# Patient Record
Sex: Female | Born: 1970
Health system: Southern US, Community
[De-identification: ages and names within clinical notes are randomized; demographics above are authoritative.]

## PROBLEM LIST (undated history)

## (undated) DIAGNOSIS — K219 Gastro-esophageal reflux disease without esophagitis: Secondary | ICD-10-CM

## (undated) DIAGNOSIS — C801 Malignant (primary) neoplasm, unspecified: Secondary | ICD-10-CM

## (undated) DIAGNOSIS — D649 Anemia, unspecified: Secondary | ICD-10-CM

## (undated) DIAGNOSIS — IMO0001 Reserved for inherently not codable concepts without codable children: Secondary | ICD-10-CM

## (undated) DIAGNOSIS — Z98891 History of uterine scar from previous surgery: Secondary | ICD-10-CM

## (undated) DIAGNOSIS — R112 Nausea with vomiting, unspecified: Secondary | ICD-10-CM

## (undated) DIAGNOSIS — Z8719 Personal history of other diseases of the digestive system: Secondary | ICD-10-CM

## (undated) DIAGNOSIS — N63 Unspecified lump in unspecified breast: Secondary | ICD-10-CM

## (undated) DIAGNOSIS — K279 Peptic ulcer, site unspecified, unspecified as acute or chronic, without hemorrhage or perforation: Secondary | ICD-10-CM

## (undated) DIAGNOSIS — F419 Anxiety disorder, unspecified: Secondary | ICD-10-CM

## (undated) DIAGNOSIS — T7840XA Allergy, unspecified, initial encounter: Secondary | ICD-10-CM

## (undated) DIAGNOSIS — Z9889 Other specified postprocedural states: Secondary | ICD-10-CM

## (undated) HISTORY — DX: Peptic ulcer, site unspecified, unspecified as acute or chronic, without hemorrhage or perforation: K27.9

## (undated) HISTORY — PX: UPPER GASTROINTESTINAL ENDOSCOPY: SHX188

## (undated) HISTORY — DX: Allergy, unspecified, initial encounter: T78.40XA

## (undated) HISTORY — DX: Reserved for inherently not codable concepts without codable children: IMO0001

## (undated) HISTORY — DX: Unspecified lump in unspecified breast: N63.0

## (undated) HISTORY — PX: BREAST BIOPSY: SHX20

## (undated) HISTORY — DX: History of uterine scar from previous surgery: Z98.891

## (undated) HISTORY — DX: Gastro-esophageal reflux disease without esophagitis: K21.9

## (undated) HISTORY — PX: EYE SURGERY: SHX253

## (undated) HISTORY — DX: Anemia, unspecified: D64.9

## (undated) HISTORY — DX: Anxiety disorder, unspecified: F41.9

---

## 1999-07-19 ENCOUNTER — Other Ambulatory Visit: Admission: RE | Admit: 1999-07-19 | Discharge: 1999-07-19 | Payer: Self-pay | Admitting: Obstetrics & Gynecology

## 2001-02-18 ENCOUNTER — Other Ambulatory Visit: Admission: RE | Admit: 2001-02-18 | Discharge: 2001-02-18 | Payer: Self-pay | Admitting: Obstetrics & Gynecology

## 2002-06-27 ENCOUNTER — Inpatient Hospital Stay (HOSPITAL_COMMUNITY): Admission: AD | Admit: 2002-06-27 | Discharge: 2002-06-29 | Payer: Self-pay | Admitting: Obstetrics & Gynecology

## 2003-08-16 ENCOUNTER — Other Ambulatory Visit: Admission: RE | Admit: 2003-08-16 | Discharge: 2003-08-16 | Payer: Self-pay | Admitting: Obstetrics and Gynecology

## 2004-10-20 ENCOUNTER — Other Ambulatory Visit: Admission: RE | Admit: 2004-10-20 | Discharge: 2004-10-20 | Payer: Self-pay | Admitting: Obstetrics and Gynecology

## 2005-07-05 ENCOUNTER — Ambulatory Visit (HOSPITAL_COMMUNITY): Admission: RE | Admit: 2005-07-05 | Discharge: 2005-07-05 | Payer: Self-pay | Admitting: Family Medicine

## 2006-09-30 ENCOUNTER — Other Ambulatory Visit: Admission: RE | Admit: 2006-09-30 | Discharge: 2006-09-30 | Payer: Self-pay | Admitting: Obstetrics & Gynecology

## 2007-06-12 ENCOUNTER — Encounter: Admission: RE | Admit: 2007-06-12 | Discharge: 2007-06-12 | Payer: Self-pay | Admitting: Obstetrics and Gynecology

## 2007-06-20 ENCOUNTER — Encounter: Admission: RE | Admit: 2007-06-20 | Discharge: 2007-06-20 | Payer: Self-pay | Admitting: Obstetrics and Gynecology

## 2007-11-25 ENCOUNTER — Other Ambulatory Visit: Admission: RE | Admit: 2007-11-25 | Discharge: 2007-11-25 | Payer: Self-pay | Admitting: Obstetrics and Gynecology

## 2007-12-08 ENCOUNTER — Encounter: Admission: RE | Admit: 2007-12-08 | Discharge: 2007-12-08 | Payer: Self-pay | Admitting: Obstetrics and Gynecology

## 2008-06-30 ENCOUNTER — Encounter: Admission: RE | Admit: 2008-06-30 | Discharge: 2008-06-30 | Payer: Self-pay | Admitting: Obstetrics and Gynecology

## 2008-12-13 ENCOUNTER — Other Ambulatory Visit: Admission: RE | Admit: 2008-12-13 | Discharge: 2008-12-13 | Payer: Self-pay | Admitting: Obstetrics and Gynecology

## 2009-10-18 ENCOUNTER — Encounter: Admission: RE | Admit: 2009-10-18 | Discharge: 2009-10-18 | Payer: Self-pay | Admitting: Obstetrics and Gynecology

## 2010-11-01 ENCOUNTER — Ambulatory Visit: Admit: 2010-11-01 | Payer: Self-pay | Admitting: Internal Medicine

## 2010-11-01 ENCOUNTER — Ambulatory Visit (HOSPITAL_COMMUNITY)
Admission: RE | Admit: 2010-11-01 | Discharge: 2010-11-01 | Payer: Self-pay | Source: Home / Self Care | Attending: Internal Medicine | Admitting: Internal Medicine

## 2010-11-12 ENCOUNTER — Encounter: Payer: Self-pay | Admitting: Obstetrics & Gynecology

## 2010-11-13 NOTE — Op Note (Signed)
NAMECAIAH, MCGLATHERY            ACCOUNT NO.:  [id number]  MEDICAL RECORD NO.:  [id number]          PATIENT TYPE:  AMB  LOCATION:  DAY                           FACILITY:  [facility]  PHYSICIAN:  Lionel December, M.D.    DATE OF BIRTH:  [birth year]  DATE OF PROCEDURE:  11/01/2010 DATE OF DISCHARGE:  11/01/2010                              OPERATIVE REPORT   PROCEDURE:  Esophagogastroduodenoscopy.  INDICATIONS:  Samantha Wang is 40 year old Caucasian female who was diagnosed with gastric ulcer about 18 months ago and has been on various PPIs.  Biopsies were negative for inflammatory bowel disease or neoplasm.  She has been treated for H. pylori infection, although studies have been negative and her gastrin level has been normal.  She does not take any NSAIDs.  She also had EUS which did not show any infiltrating process.  She is presently asymptomatic, but on a Dexilant and ferrous sulfate.  She is undergoing EGD to hopefully to document that ulcer has healed completely.  Procedures were reviewed with the patient.  Informed consent was obtained.  MEDS FOR CONSCIOUS SEDATION:  Cetacaine spray for pharyngeal topical anesthesia, Demerol 50 mg IV, Versed 12 mg IV.  FINDINGS:  Procedure performed in endoscopy suite.  The patient's vital signs and O2 sat were monitored during the procedure and remained stable.  The patient was placed in left lateral recumbent position and Pentax videoscope was passed through oropharynx without any difficulty into esophagus.  Esophagus.  Mucosa of the esophagus normal.  GE junction was located at 35 cm from the incisors serrated otherwise unremarkable.  Hiatus was at 37 cm.  Stomach.  It was empty and distended very well by insufflation.  Folds of proximal stomach are normal.  Examination of mucosa at body and antrum was normal.  Antrum, there was a pseudo-pylorus with some scarring, however, ulcer was beyond this towards 11 o'clock close to the pylorus,  surrounded by erythematous mucosa.  This ulcer was about 8 x 10 mm.  It is clean base.  Pyloric channel was patent.  Angularis, fundus, and cardia were examined by retroflexion of scope and were normal.  On the way out biopsy was taken from the mucosa surrounding this ulcer and ulcer margin.  Duodenum.  Bulbar mucosa was normal.  Scope was passed to the second part of duodenal mucosa and folds were normal.  Endoscope was withdrawn. The patient tolerated the procedure well.  FINAL DIAGNOSES: 1. A 2-cm size sliding hiatal hernia. 2. Prepyloric gastric ulcer about 8 x 10 mm and small scars in the antrum. 3. Biopsy taken from ulcer margin and surrounding mucosa for routine     histology.  RECOMMENDATIONS:  She will continue antireflux measures and Dexilant as before.  I will be contacting the patient with results of biopsy and further recommendations.     Lionel December, M.D.     NR/MEDQ  D:  11/01/2010  T:  11/02/2010  Job:  [id number]  cc:   Donna Bernard, M.D. Fax: [phone]  Electronically Signed by Lionel December M.D. on 11/13/2010 12:17:55 PM

## 2011-03-09 NOTE — Discharge Summary (Signed)
NAMESEMIKA, ZOU                      ACCOUNT NO.:  [id number]   MEDICAL RECORD NO.:  [id number]                   PATIENT TYPE:  INP   LOCATION:  9128                                 FACILITY:  [facility]   PHYSICIAN:  Randye Lobo, M.D.                DATE OF BIRTH:  09-14-1971   DATE OF ADMISSION:  06/27/2002  DATE OF DISCHARGE:  06/29/2002                                 DISCHARGE SUMMARY   FINAL DIAGNOSES:  1. Intrauterine pregnancy at term.  2. Spnotaneous rupture of membranes.  3. Desires repeat cesarean section.   PROCEDURE:  Low transverse cesarean section.   SURGEON:  Carrington Clamp, M.D.   ASSISTANT:  Kathreen Cosier, M.D.   COMPLICATIONS:  None.   HISTORY:  This 40 year old G2, P1-0-0-1 presents at 69 and 2/7 weeks with  spontaneous rupture of membranes.  The patient's contractions were about  every two to three minutes apart.  She was scheduled for repeat cesarean  section in a week or so so at this point she was being taken to the  operating room for a repeat cesarean section.  The patient also is started  on group B Strep prophylaxis secondary to a positive group B Strep culture  obtained in the office.  The patient was taken to the operating room on  June 27, 2002 by Dr. Carrington Clamp where a repeat low transverse  cesarean section was performed with the delivery of a 7 pound 5 ounce female  infant with Apgars of 9 and 9.  Delivery went without complications.  The  patient's postoperative course was benign without significant fevers.  The  patient was felt ready for discharge on postoperative day number two.  Was  sent home on a regular diet.  Told to decrease activities.  Told to continue  prenatal vitamins.  Was given a prescription for Percocet one to two q.5h.  as needed for pain.  Told she could use over-the-counter ibuprofen as  needed.  Was to follow up in the office in three days for staple removal.  Was to call with any increased  temperatures, pain, or bleeding.     Leilani Able, PA-C                   Randye Lobo, M.D.    MB/MEDQ  D:  08/10/2002  T:  08/10/2002  Job:  [id number]

## 2011-03-09 NOTE — Op Note (Signed)
Samantha Wang, Samantha Wang                      ACCOUNT NO.:  [id number]   MEDICAL RECORD NO.:  [id number]                   PATIENT TYPE:  INP   LOCATION:  9128                                 FACILITY:  [facility]   PHYSICIAN:  Carrington Clamp, M.D.              DATE OF BIRTH:  [birth year]   DATE OF PROCEDURE:  06/27/2002  DATE OF DISCHARGE:                                 OPERATIVE REPORT   PREOPERATIVE DIAGNOSIS:  Spontaneous rupture of membranes and labor at term  for repeat cesarean section.   POSTOPERATIVE DIAGNOSIS:  Spontaneous rupture of membranes and labor at term  for repeat cesarean section.   PROCEDURE:  Low transverse cesarean section.   ATTENDING SURGEON:  Carrington Clamp, M.D.   ASSISTANT:  Kathreen Cosier, M.D.   ANESTHESIA:  Spinal.   ESTIMATED BLOOD LOSS:  800 cc.   IV FLUIDS:  2000 cc.   URINE OUTPUT:  200 cc.   COMPLICATIONS:  None.   FINDINGS:  Female infant, Apgars 9 and 9.  Normal tubes, ovaries, and uterus  seen.   MEDICATIONS:  Penicillin before cesarean section, Cefotan, and Pitocin.   PATHOLOGY:  None.   INSTRUMENT COUNT:  Correct x3.   PROCEDURE:  After adequate spinal anesthesia was achieved, the patient was  prepped and draped in the usual sterile fashion, supine position with left  foot tilt.  A Pfannenstiel skin incision was made with a scalpel and carried  down to the fascia with the Bovie cautery.  The fascia was incised in the  midline with the scalpel and carried in a transverse curvilinear manner with  the Mayo scissors.  The fascia was then reflected superiorly and inferiorly  from the rectus muscles.  The rectus muscles were identified and split in  the midline.  About 3/4 of the peritoneum was entered into bluntly and the  peritoneum was then stretched bluntly as well.  There was good visualization  of the bowel and bladder.   The bladder blade was placed  and the vesicouterine fascia was tented up in  a transverse  curvilinear manner.  Blunt dissection was used to create the  bladder flap and the bladder blade was replaced.  A scalpel was used to make  the 2-cm transverse incision in the upper portion of the lower uterine  segment until clear fluid was noted upon entry.  The bandage scissors were  used to cut in a transverse curvilinear manner and the baby was identified  in the vertex position and delivered without complication.   The baby was bulb suctioned, the cord was clamped and cut, and the baby was  handed to waiting pediatrics.  The placenta was then delivered manually and  the uterus exteriorized, wrapped in a wet lap pack, and cleared of all  debris.  The uterine incision was closed with running-lock stitch of 0  Monocryl.  Good hemostasis was achieved and the uterus was reapproximated in  the abdomen and irrigation performed.  The uterine incision was reinspected,  found to be hemostatic.  The peritoneum was closed with running stitch of 2-  0 Vicryl and the fascia was closed with running stitch of 0 Vicryl.  The  subcutaneous tissue was rendered hemostatic with Bovie cautery and  irrigation.  The skin was closed with staples.                                               Carrington Clamp, M.D.    MH/MEDQ  D:  06/27/2002  T:  06/27/2002  Job:  [id number]

## 2011-06-07 ENCOUNTER — Ambulatory Visit (INDEPENDENT_AMBULATORY_CARE_PROVIDER_SITE_OTHER): Payer: Self-pay | Admitting: Internal Medicine

## 2011-08-14 ENCOUNTER — Encounter (INDEPENDENT_AMBULATORY_CARE_PROVIDER_SITE_OTHER): Payer: Self-pay | Admitting: Internal Medicine

## 2011-08-14 ENCOUNTER — Ambulatory Visit (INDEPENDENT_AMBULATORY_CARE_PROVIDER_SITE_OTHER): Payer: 59 | Admitting: Internal Medicine

## 2011-08-14 VITALS — BP 110/72 | HR 70 | Temp 99.3°F | Resp 14 | Ht 62.0 in | Wt 145.0 lb

## 2011-08-14 DIAGNOSIS — K279 Peptic ulcer, site unspecified, unspecified as acute or chronic, without hemorrhage or perforation: Secondary | ICD-10-CM

## 2011-08-14 MED ORDER — OMEPRAZOLE 20 MG PO CPDR: 20.0000 mg | DELAYED_RELEASE_CAPSULE | Freq: Every day | ORAL | Status: DC

## 2011-08-14 NOTE — Patient Instructions (Signed)
Continue omeprazole 20 mg by mouth 30 minutes before breakfast daily.

## 2011-08-19 NOTE — Progress Notes (Signed)
Presenting complaint; followup for peptic ulcer disease. Subjective; patient is 40 year old Caucasian female who has history of nonhealing gastric ulcers. She was last seen in May 2011. She had EUS at [facility] repeat biopsy and these are benign ulcers. He also has been seen at [facility] but unfortunately clinical course has not changed. She recently forgot to take her medication and on day 3 she developed interscapular and retrosternal as well as epigastric pain she also has some regurgitation. She did not experience nausea vomiting heartburn melena or rectal bleeding. As soon as she went back on her PPI her symptoms resolved within a day or 2. She has lost 6 pounds since her last visit but this is felt to be voluntary weight loss. She is not experiencing any side effects with PPI. Current medications Current Outpatient Prescriptions  Medication Sig Dispense Refill  . Ferrous Sulfate Dried (SLOW IRON PO) Take by mouth. As Needed       . FLUTICASONE PROPIONATE, NASAL, NA Place into the nose. 2 sprays in each nostril every day       . loratadine (CLARITIN) 10 MG tablet Take 10 mg by mouth daily.        Marland Kitchen OMEPRAZOLE PO Take 20 mg by mouth.        Marland Kitchen omeprazole (PRILOSEC) 20 MG capsule Take 1 capsule (20 mg total) by mouth daily.  30 capsule  11   Objective BP 110/72  Pulse 70  Temp(Src) 99.3 F (37.4 C) (Oral)  Resp 14  Ht 5\' 2"  (1.575 m)  Wt 145 lb (65.772 kg)  BMI 26.52 kg/m2  LMP 07/23/2011 Conjunctiva is pink. Sclerae nonicteric. Oropharyngeal mucosa is normal. No neck masses or thyromegaly noted. Abdomen is soft and nontender without organomegaly or masses. No LE edema or clubbing noted. Assessment Nonhealing gastric ulcers. No evidence of H. pylori infection or elevated gastrin levels. She has been empirically treated her H. pylori gastritis which obviously did not make any difference since her symptoms relapse within 2 days of stopping PPI it is not a  placebo. Plan Once again the patient advised not to take any OTC NSAIDs. She will continue omeprazole at 20 mg daily by mouth 30 minutes before breakfast. New prescription given. She'll take ferrous sulfate couple of times a week. Unless she has problems she'll return for office visit in one year.

## 2011-11-02 ENCOUNTER — Other Ambulatory Visit: Payer: Self-pay | Admitting: Obstetrics and Gynecology

## 2011-11-02 DIAGNOSIS — Z1231 Encounter for screening mammogram for malignant neoplasm of breast: Secondary | ICD-10-CM

## 2011-11-19 ENCOUNTER — Ambulatory Visit
Admission: RE | Admit: 2011-11-19 | Discharge: 2011-11-19 | Disposition: A | Discharge status: Home / Self Care | Payer: 59 | Source: Ambulatory Visit | Attending: Obstetrics and Gynecology | Admitting: Obstetrics and Gynecology

## 2011-11-19 DIAGNOSIS — Z1231 Encounter for screening mammogram for malignant neoplasm of breast: Secondary | ICD-10-CM

## 2011-11-22 ENCOUNTER — Other Ambulatory Visit: Payer: Self-pay | Admitting: Obstetrics and Gynecology

## 2011-11-22 DIAGNOSIS — R928 Other abnormal and inconclusive findings on diagnostic imaging of breast: Secondary | ICD-10-CM

## 2011-12-06 ENCOUNTER — Ambulatory Visit
Admission: RE | Admit: 2011-12-06 | Discharge: 2011-12-06 | Disposition: A | Discharge status: Home / Self Care | Payer: 59 | Source: Ambulatory Visit | Attending: Obstetrics and Gynecology | Admitting: Obstetrics and Gynecology

## 2011-12-06 ENCOUNTER — Other Ambulatory Visit: Payer: Self-pay | Admitting: Obstetrics and Gynecology

## 2011-12-06 DIAGNOSIS — R928 Other abnormal and inconclusive findings on diagnostic imaging of breast: Secondary | ICD-10-CM

## 2011-12-06 DIAGNOSIS — N63 Unspecified lump in unspecified breast: Secondary | ICD-10-CM

## 2011-12-14 ENCOUNTER — Ambulatory Visit
Admission: RE | Admit: 2011-12-14 | Discharge: 2011-12-14 | Disposition: A | Discharge status: Home / Self Care | Payer: 59 | Source: Ambulatory Visit | Attending: Obstetrics and Gynecology | Admitting: Obstetrics and Gynecology

## 2011-12-14 ENCOUNTER — Other Ambulatory Visit: Payer: Self-pay | Admitting: Obstetrics and Gynecology

## 2011-12-14 ENCOUNTER — Other Ambulatory Visit: Payer: 59

## 2011-12-14 DIAGNOSIS — N63 Unspecified lump in unspecified breast: Secondary | ICD-10-CM

## 2012-07-30 ENCOUNTER — Encounter (INDEPENDENT_AMBULATORY_CARE_PROVIDER_SITE_OTHER): Payer: Self-pay | Admitting: *Deleted

## 2012-08-13 ENCOUNTER — Ambulatory Visit (INDEPENDENT_AMBULATORY_CARE_PROVIDER_SITE_OTHER): Payer: 59 | Admitting: Internal Medicine

## 2012-09-09 ENCOUNTER — Other Ambulatory Visit (INDEPENDENT_AMBULATORY_CARE_PROVIDER_SITE_OTHER): Payer: Self-pay | Admitting: Internal Medicine

## 2012-10-06 ENCOUNTER — Encounter (INDEPENDENT_AMBULATORY_CARE_PROVIDER_SITE_OTHER): Payer: Self-pay | Admitting: Internal Medicine

## 2012-10-06 ENCOUNTER — Ambulatory Visit (INDEPENDENT_AMBULATORY_CARE_PROVIDER_SITE_OTHER): Payer: 59 | Admitting: Internal Medicine

## 2012-10-06 VITALS — BP 112/68 | HR 74 | Temp 98.2°F | Resp 20 | Ht 62.0 in | Wt 145.2 lb

## 2012-10-06 DIAGNOSIS — K219 Gastro-esophageal reflux disease without esophagitis: Secondary | ICD-10-CM

## 2012-10-06 DIAGNOSIS — K279 Peptic ulcer, site unspecified, unspecified as acute or chronic, without hemorrhage or perforation: Secondary | ICD-10-CM | POA: Insufficient documentation

## 2012-10-06 DIAGNOSIS — F439 Reaction to severe stress, unspecified: Secondary | ICD-10-CM | POA: Insufficient documentation

## 2012-10-06 DIAGNOSIS — K589 Irritable bowel syndrome without diarrhea: Secondary | ICD-10-CM

## 2012-10-06 MED ORDER — CALCIUM CARBONATE-VITAMIN D 500-200 MG-UNIT PO TABS: 1.0000 | ORAL_TABLET | Freq: Two times a day (BID) | ORAL | Status: DC

## 2012-10-06 MED ORDER — HYOSCYAMINE SULFATE 0.125 MG SL SUBL: 0.1250 mg | SUBLINGUAL_TABLET | Freq: Three times a day (TID) | SUBLINGUAL | Status: DC | PRN

## 2012-10-06 NOTE — Progress Notes (Signed)
Presenting complaint;  Followup for GERD and PUD. Patient  complains of irregular bowel movement.  Subjective:  Patient is 41 year old Caucasian female who is here for yearly visit. She has nonhealing gastric ulcer. Her last EGD was in January 2012. She has been to to tertiary centers. She's had EUS with multiple biopsies as well as gastrin levels and H. pylori serologies. She states as for as or ulcers concerned she is not having any pain. Only time she has chest pain as if she misses PPI dose. She denies nausea vomiting or abdominal pain. Lately she's been under a lot of stress and she there has diarrhea and/or constipation. She hasn't had a BM for the last 3 days. Her appetite is good but she is afraid to eat. Her weight has been stable since her last visit 13 months ago. She is not taking vitamin D and anymore. She does not take any NSAIDs.  Current Medications: Current Outpatient Prescriptions  Medication Sig Dispense Refill  . Ferrous Sulfate Dried (SLOW IRON PO) Take by mouth. As Needed       . fexofenadine (ALLEGRA) 180 MG tablet Take 180 mg by mouth as needed.      Marland Kitchen FLUTICASONE PROPIONATE, NASAL, NA Place into the nose. 2 sprays in each nostril every day       . LORazepam (ATIVAN) 1 MG tablet Take 1 mg by mouth as needed. Patient states that she takes 1/2 tablet when she takes it      . omeprazole (PRILOSEC) 20 MG capsule TAKE ONE CAPSULE BY MOUTH EVERY DAY  30 capsule  11     Objective: Blood pressure 112/68, pulse 74, temperature 98.2 F (36.8 C), temperature source Oral, resp. rate 20, height 5\' 2"  (1.575 m), weight 145 lb 3.2 oz (65.862 kg), last menstrual period 10/03/2012. Patient is alert and in no acute distress. Conjunctiva is pink. Sclera is nonicteric Oropharyngeal mucosa is normal. No neck masses or thyromegaly noted. Cardiac exam with regular rhythm normal S1 and S2. No murmur or gallop noted. Lungs are clear to auscultation. Abdomen. Bowel sounds are normal.  Abdomen is soft and nontender without organomegaly or masses. No LE edema or clubbing noted.   Assessment:  #1. Chronic GERD. She has atypical symptoms in the form of chest pain. She is asymptomatic as long as she takes her PPI. Will consider EGD at some point in future if she experiences epigastric pain. #2. Nonhealing gastric ulcer. She remains on chronic PPI therapy. #3. History of anemia. #4. History of vitamin D deficiency. #5. IBS. Symptom flare-up secondary to stress.  Plan:   Will request copy of blood work report from May 2011 from [facility]. She will go to the lab for CBC and serum ferritin levels. Levsin sublingual 1 tablet 3 times a day when necessary. Os-Cal 500/200 one twice a day. Probiotic one capsule by mouth daily. Patient will call with progress report in one month. Office visit in one year unless IBS symptoms do not improve with therapy.

## 2012-10-06 NOTE — Patient Instructions (Signed)
Physician will contact you with results of blood work. Call office with progress report in 4 weeks regarding bowel symptoms.

## 2012-10-07 LAB — CBC
Hemoglobin: 10.8 g/dL — ABNORMAL LOW (ref 12.0–15.0)
MCH: 25.4 pg — ABNORMAL LOW (ref 26.0–34.0)
Platelets: 324 10*3/uL (ref 150–400)
RBC: 4.25 MIL/uL (ref 3.87–5.11)
WBC: 6.6 10*3/uL (ref 4.0–10.5)

## 2012-10-07 LAB — FERRITIN: Ferritin: 3 ng/mL — ABNORMAL LOW (ref 10–291)

## 2012-10-09 ENCOUNTER — Telehealth (INDEPENDENT_AMBULATORY_CARE_PROVIDER_SITE_OTHER): Payer: Self-pay | Admitting: *Deleted

## 2012-10-09 DIAGNOSIS — K279 Peptic ulcer, site unspecified, unspecified as acute or chronic, without hemorrhage or perforation: Secondary | ICD-10-CM

## 2012-10-09 DIAGNOSIS — R790 Abnormal level of blood mineral: Secondary | ICD-10-CM

## 2012-10-09 DIAGNOSIS — E611 Iron deficiency: Secondary | ICD-10-CM

## 2012-10-09 NOTE — Telephone Encounter (Signed)
Per Dr.Rehman the patient will need to have H/H in 3 months 

## 2012-11-10 ENCOUNTER — Telehealth (INDEPENDENT_AMBULATORY_CARE_PROVIDER_SITE_OTHER): Payer: Self-pay | Admitting: *Deleted

## 2012-11-10 NOTE — Telephone Encounter (Signed)
Geryl called and ask that she be called back. This is a progress report from her last office visit. The plan for that day was as follows:Will request copy of blood work report from May 2011 from [facility].  She will go to the lab for CBC and serum ferritin levels.  Levsin sublingual 1 tablet 3 times a day when necessary.  Os-Cal 500/200 one twice a day.  Probiotic one capsule by mouth daily.  Patient will call with progress report in one month.  Office visit in one year unless IBS symptoms do not improve with therapy.  Avni states that the Levsin , she could not take 3 times a day. Due to making her sleepy and dizzy. She doesn't  care for it but ask if this is something that she can take in case she has abdominal cramping? The Probiotic she took daily for 2 weeks and really could not tell a difference. She did note that on a Sunday night she experienced cramping, even woke her up, she did have to go to the bathroom throughout the night. She continued to have abdominal cramping on Monday and had to take a day from work. Kataleyah states that it Dr.Rehman feel that this is something that she needed to do , she will. Per Dr.Rehman the patient may take 1 2 mg Imodium daily , she may also take the Levsin on an as needed basis.Patient called and made aware.  Iron can cause the constipation and make diarrhea worse. Patient called and made aware.

## 2012-12-17 ENCOUNTER — Other Ambulatory Visit: Payer: Self-pay

## 2012-12-17 DIAGNOSIS — Z1231 Encounter for screening mammogram for malignant neoplasm of breast: Secondary | ICD-10-CM

## 2012-12-18 ENCOUNTER — Encounter (INDEPENDENT_AMBULATORY_CARE_PROVIDER_SITE_OTHER): Payer: Self-pay | Admitting: *Deleted

## 2012-12-18 ENCOUNTER — Other Ambulatory Visit (INDEPENDENT_AMBULATORY_CARE_PROVIDER_SITE_OTHER): Payer: Self-pay | Admitting: *Deleted

## 2012-12-18 DIAGNOSIS — E611 Iron deficiency: Secondary | ICD-10-CM

## 2012-12-18 DIAGNOSIS — R790 Abnormal level of blood mineral: Secondary | ICD-10-CM

## 2013-01-05 ENCOUNTER — Ambulatory Visit: Payer: 59

## 2013-01-07 ENCOUNTER — Encounter: Payer: Self-pay | Admitting: Certified Nurse Midwife

## 2013-01-07 ENCOUNTER — Ambulatory Visit (INDEPENDENT_AMBULATORY_CARE_PROVIDER_SITE_OTHER): Payer: 59 | Admitting: Certified Nurse Midwife

## 2013-01-07 VITALS — BP 110/64 | Ht 62.75 in | Wt 147.0 lb

## 2013-01-07 DIAGNOSIS — Z0000 Encounter for general adult medical examination without abnormal findings: Secondary | ICD-10-CM

## 2013-01-07 NOTE — Progress Notes (Signed)
42 y.o. MarriedCaucasian female   [phone] here for annual exam.    Patient's last menstrual period was 01/01/2013.          Sexually active: yes  The current method of family planning is vasectomy.    Exercising: no  The patient does not participate in regular exercise at present. Last mammogram:10/2011   Smoking:no Alcohol:no Last colonoscopy:no Last Bone Density:    Hgb:                Urine:   reports that she has never smoked. She has never used smokeless tobacco. She reports that she does not drink alcohol or use illicit drugs.  Health Maintenance  Topic Date Due  . Influenza Vaccine  01/29/1971  . Pap Smear  06/25/1989  . Tetanus/tdap  12/15/2019    Family History  Problem Relation Age of Onset  . Healthy Mother   . Healthy Father   . Healthy Brother   . Healthy Son   . Healthy Daughter     Patient Active Problem List  Diagnosis  . GERD (gastroesophageal reflux disease)  . PUD (peptic ulcer disease)  . IBS (irritable bowel syndrome)  . Stress    Past Medical History  Diagnosis Date  . Anemia   . Peptic ulcer disease   . Breast nodule     left   . H/O cesarean section     history of 2 c-sections      Past Surgical History  Procedure Laterality Date  . Upper gastrointestinal endoscopy    . Cesarean section  4540,9811    Allergies: Review of patient's allergies indicates no known allergies.  Current Outpatient Prescriptions  Medication Sig Dispense Refill  . calcium-vitamin D (OSCAL 500/200 D-3) 500-200 MG-UNIT per tablet Take 1 tablet by mouth 2 (two) times daily.      . Ferrous Sulfate Dried (SLOW IRON PO) Take by mouth. As Needed       . fexofenadine (ALLEGRA) 180 MG tablet Take 180 mg by mouth as needed.      Marland Kitchen FLUTICASONE PROPIONATE, NASAL, NA Place into the nose. 2 sprays in each nostril every day       . LORazepam (ATIVAN) 1 MG tablet Take 1 mg by mouth as needed. Patient states that she takes 1/2 tablet when she takes it      . omeprazole  (PRILOSEC) 20 MG capsule TAKE ONE CAPSULE BY MOUTH EVERY DAY  30 capsule  11   No current facility-administered medications for this visit.  Patient has noticed headache with menses the past  6 months for about 2- 3 days. No visual disturbances with headache. Occasional nausea but no vomiting when occurs.  Uses Tylenol(OTC) with some relief.    ROS: Pertinent items are noted in HPI.  Exam:    BP 110/64  Ht 5' 2.75" (1.594 m)  Wt 147 lb (66.679 kg)  BMI 26.24 kg/m2  LMP 01/01/2013   Wt Readings from Last 3 Encounters:  01/07/13 147 lb (66.679 kg)  10/06/12 145 lb 3.2 oz (65.862 kg)  08/14/11 145 lb (65.772 kg)     Ht Readings from Last 3 Encounters:  01/07/13 5' 2.75" (1.594 m)  10/06/12 5\' 2"  (1.575 m)  08/14/11 5\' 2"  (1.575 m)    General appearance: alert, cooperative and appears stated age Head: Normocephalic, without obvious abnormality, atraumatic Neck: no adenopathy, supple, symmetrical, trachea midline and thyroid not enlarged, symmetric, no tenderness/mass/nodules Lungs: clear to auscultation bilaterally Breasts: Inspection negative, No nipple  retraction or dimpling, No nipple discharge or bleeding, No axillary or supraclavicular adenopathy, Normal to palpation without dominant masses Heart: regular rate and rhythm Abdomen: soft, non-tender; bowel sounds normal; no masses,  no organomegaly Extremities: extremities normal, atraumatic, no cyanosis or edema Skin: Skin color, texture, turgor normal. No rashes or lesions Lymph nodes: Cervical, supraclavicular, and axillary nodes normal. No abnormal inguinal nodes palpated Neurologic: Grossly normal   Pelvic: External genitalia:  no lesions              Urethra:  normal appearing urethra with no masses, tenderness or lesions              Bartholins and Skenes: normal                 Vagina: normal appearing vagina with normal color and discharge, no lesions              Cervix: normal appearance              Pap taken:  no        Bimanual Exam:  Uterus:  uterus is normal size, shape, consistency and nontender                                      Adnexa: normal adnexa in size, nontender and no masses                                      Rectovaginal: Confirms                                      Anus:  normal sphincter tone, no lesions  A: well woman normal exam Menstrual Migraine ? No aura     P: mammogram pap smear if needed return annually or prn  Plan with headache to start on B complex daily last 2 weeks of cycle.  Increase water intake during period and avoid large intake  Of salt Report back if headache has decreased or increased.  Warnings of migraine  Headache given. Plan to schedule fasting labs    An After Visit Summary was printed and given to the patient.

## 2013-01-07 NOTE — Patient Instructions (Addendum)
        Health Maintenance  Topic Date Due  . Influenza Vaccine  [birth year]  . Pap Smear  06/25/1989  . Tetanus/tdap  12/15/2019    Family History  Problem Relation Age of Onset  . Healthy Mother   . Healthy Father   . Healthy Brother   . Healthy Son   . Healthy Daughter     Patient Active Problem List  Diagnosis  . GERD (gastroesophageal reflux disease)  . PUD (peptic ulcer disease)  . IBS (irritable bowel syndrome)  . Stress    Past Medical History  Diagnosis Date  . Anemia   . Peptic ulcer disease   . Breast nodule     left   . H/O cesarean section     history of 2 c-sections      Past Surgical History  Procedure Laterality Date  . Upper gastrointestinal endoscopy    . Cesarean section  [id number]    Allergies: Review of patient's allergies indicates no known allergies.  Current Outpatient Prescriptions  Medication Sig Dispense Refill  . calcium-vitamin D (OSCAL 500/200 D-3) 500-200 MG-UNIT per tablet Take 1 tablet by mouth 2 (two) times daily.      . Ferrous Sulfate Dried (SLOW IRON PO) Take by mouth. As Needed       . fexofenadine (ALLEGRA) 180 MG tablet Take 180 mg by mouth as needed.      Marland Kitchen FLUTICASONE PROPIONATE, NASAL, NA Place into the nose. 2 sprays in each nostril every day       . LORazepam (ATIVAN) 1 MG tablet Take 1 mg by mouth as needed. Patient states that she takes 1/2 tablet when she takes it      . omeprazole (PRILOSEC) 20 MG capsule TAKE ONE CAPSULE BY MOUTH EVERY DAY  30 capsule  11   No current facility-administered medications for this visit.    ROS: Pertinent items are noted in HPI.  Exam:    BP 110/64  Ht 5' 2.75" (1.594 m)  Wt 147 lb (66.679 kg)  BMI 26.24 kg/m2  LMP 01/01/2013   Wt Readings from Last 3 Encounters:  01/07/13 147 lb (66.679 kg)  10/06/12 145 lb 3.2 oz (65.862 kg)  08/14/11 145 lb (65.772 kg)     Ht Readings from Last 3 Encounters:  01/07/13 5' 2.75" (1.594 m)  10/06/12 5\' 2"  (1.575 m)  08/14/11 5'  2" (1.575 m)

## 2013-01-14 ENCOUNTER — Ambulatory Visit: Payer: 59

## 2013-01-14 DIAGNOSIS — Z0000 Encounter for general adult medical examination without abnormal findings: Secondary | ICD-10-CM

## 2013-01-14 LAB — COMPREHENSIVE METABOLIC PANEL
ALT: 11 U/L (ref 0–35)
AST: 15 U/L (ref 0–37)
Albumin: 4.3 g/dL (ref 3.5–5.2)
Alkaline Phosphatase: 35 U/L — ABNORMAL LOW (ref 39–117)
BUN: 14 mg/dL (ref 6–23)
Calcium: 9.1 mg/dL (ref 8.4–10.5)
Chloride: 105 mEq/L (ref 96–112)
Potassium: 4.1 mEq/L (ref 3.5–5.3)
Sodium: 140 mEq/L (ref 135–145)

## 2013-01-14 LAB — LIPID PANEL
Cholesterol: 153 mg/dL (ref 0–200)
LDL Cholesterol: 80 mg/dL (ref 0–99)
Total CHOL/HDL Ratio: 2.5 Ratio
Triglycerides: 53 mg/dL (ref <150)
VLDL: 11 mg/dL (ref 0–40)

## 2013-01-15 NOTE — Progress Notes (Signed)
01/15/13 @ 3:29pm left message to callback

## 2013-01-15 NOTE — Progress Notes (Signed)
Vit d per protocol: vitamin d 600-800 iu every day

## 2013-01-20 ENCOUNTER — Other Ambulatory Visit: Payer: Self-pay | Admitting: Certified Nurse Midwife

## 2013-01-20 DIAGNOSIS — R6889 Other general symptoms and signs: Secondary | ICD-10-CM

## 2013-01-20 DIAGNOSIS — Z01419 Encounter for gynecological examination (general) (routine) without abnormal findings: Secondary | ICD-10-CM

## 2013-01-29 ENCOUNTER — Other Ambulatory Visit (INDEPENDENT_AMBULATORY_CARE_PROVIDER_SITE_OTHER): Payer: 59

## 2013-01-29 DIAGNOSIS — Z01419 Encounter for gynecological examination (general) (routine) without abnormal findings: Secondary | ICD-10-CM

## 2013-01-29 DIAGNOSIS — R6889 Other general symptoms and signs: Secondary | ICD-10-CM

## 2013-01-29 LAB — GLUCOSE, RANDOM: Glucose, Bld: 88 mg/dL (ref 70–99)

## 2013-01-29 LAB — CBC
Hemoglobin: 11.3 g/dL — ABNORMAL LOW (ref 12.0–15.0)
MCH: 25.9 pg — ABNORMAL LOW (ref 26.0–34.0)
MCHC: 31.5 g/dL (ref 30.0–36.0)
Platelets: 288 10*3/uL (ref 150–400)
RBC: 4.36 MIL/uL (ref 3.87–5.11)

## 2013-01-29 LAB — HEMOGLOBIN A1C
Hgb A1c MFr Bld: 5.9 % — ABNORMAL HIGH (ref <5.7)
Mean Plasma Glucose: 123 mg/dL — ABNORMAL HIGH (ref <117)

## 2013-02-05 ENCOUNTER — Ambulatory Visit
Admission: RE | Admit: 2013-02-05 | Discharge: 2013-02-05 | Disposition: A | Discharge status: Home / Self Care | Payer: 59 | Source: Ambulatory Visit

## 2013-02-05 DIAGNOSIS — Z1231 Encounter for screening mammogram for malignant neoplasm of breast: Secondary | ICD-10-CM

## 2013-06-10 ENCOUNTER — Encounter: Payer: Self-pay | Admitting: Nurse Practitioner

## 2013-06-10 ENCOUNTER — Ambulatory Visit (INDEPENDENT_AMBULATORY_CARE_PROVIDER_SITE_OTHER): Payer: 59 | Admitting: Nurse Practitioner

## 2013-06-10 VITALS — BP 114/78 | Ht 62.0 in | Wt 146.6 lb

## 2013-06-10 DIAGNOSIS — J300 Vasomotor rhinitis: Secondary | ICD-10-CM

## 2013-06-10 DIAGNOSIS — F419 Anxiety disorder, unspecified: Secondary | ICD-10-CM | POA: Insufficient documentation

## 2013-06-10 DIAGNOSIS — F411 Generalized anxiety disorder: Secondary | ICD-10-CM

## 2013-06-10 DIAGNOSIS — J309 Allergic rhinitis, unspecified: Secondary | ICD-10-CM

## 2013-06-10 MED ORDER — CLONAZEPAM 0.5 MG PO TABS: ORAL_TABLET | ORAL | Status: DC

## 2013-06-10 MED ORDER — CITALOPRAM HYDROBROMIDE 20 MG PO TABS: ORAL_TABLET | ORAL | Status: DC

## 2013-06-10 MED ORDER — FLUTICASONE PROPIONATE 50 MCG/ACT NA SUSP: NASAL | Status: DC

## 2013-06-10 NOTE — Assessment & Plan Note (Signed)
.   citalopram (CELEXA) 20 MG tablet    Sig: 1/2 tab po qhs x 6 days then if tolerated increase to one tab po qhs    Dispense:  30 tablet    Refill:  0    Order Specific Question:  Supervising Provider    Answer:  Merlyn Albert [[id number]]  . clonazePAM (KLONOPIN) 0.5 MG tablet    Sig: 1/2 to 1 po BID prn anxiety    Dispense:  30 tablet    Refill:  0    Order Specific Question:  Supervising Provider    Answer:  Riccardo Dubin   Reviewed potential adverse effects of Celexa. DC med and call if any problems. Drowsiness precautions regarding clonazepam. Recheck in 3-4 weeks, call back sooner if any problems. Discussed importance of stress reduction.

## 2013-06-10 NOTE — Progress Notes (Signed)
Subjective:  Presents for complaints of anxiety symptoms have been going on for months. Having daily symptoms that vary in intensity. Had a prescription for lorazepam that has expired, still has some leftover. Tried to use that during the day sometimes with extreme anxiety but made her very drowsy. Fatigue, emotional lability and some sleep disturbance. Also requesting a refill on her Flonase which is working well for her rhinitis symptoms.  Objective:   BP 114/78  Ht 5\' 2"  (1.575 m)  Wt 146 lb 9.6 oz (66.497 kg)  BMI 26.81 kg/m2 NAD. Alert, oriented. TMs clear effusion, no erythema. Pharynx mildly erythematous with PND noted. Neck supple with mild soft nontender adenopathy. Lungs clear. Heart regular rate rhythm.  Assessment:Anxiety  Vasomotor rhinitis  Plan: Meds ordered this encounter  Medications  . fluticasone (FLONASE) 50 MCG/ACT nasal spray    Sig: 2 sprays each nostril qd prn congestion    Dispense:  16 g    Refill:  11    Order Specific Question:  Supervising Provider    Answer:  Merlyn Albert [[id number]]  . citalopram (CELEXA) 20 MG tablet    Sig: 1/2 tab po qhs x 6 days then if tolerated increase to one tab po qhs    Dispense:  30 tablet    Refill:  0    Order Specific Question:  Supervising Provider    Answer:  Merlyn Albert [[id number]]  . clonazePAM (KLONOPIN) 0.5 MG tablet    Sig: 1/2 to 1 po BID prn anxiety    Dispense:  30 tablet    Refill:  0    Order Specific Question:  Supervising Provider    Answer:  Riccardo Dubin   Reviewed potential adverse effects of Celexa. DC med and call if any problems. Drowsiness precautions regarding clonazepam. Recheck in 3-4 weeks, call back sooner if any problems. Discussed importance of stress reduction.

## 2013-06-23 ENCOUNTER — Telehealth: Payer: Self-pay | Admitting: Nurse Practitioner

## 2013-06-23 NOTE — Telephone Encounter (Signed)
Her citalopram (CELEXA) 20 MG tablet  Is not going well for her. Her symptoms are as follows: stomach cramps, diarrhea. Pt states she was told to call back if there were any side affects. What should she do from here. Advised pt Samantha Wang back on Wed, she said that was fine. [employer]

## 2013-06-25 ENCOUNTER — Other Ambulatory Visit: Payer: Self-pay | Admitting: Nurse Practitioner

## 2013-06-25 MED ORDER — ESCITALOPRAM OXALATE 10 MG PO TABS: 10.0000 mg | ORAL_TABLET | Freq: Every day | ORAL | Status: DC

## 2013-06-25 NOTE — Telephone Encounter (Signed)
Will switch to Lexapro 10 mg qd.  Call back if any problems.  Otherwise recheck in a few weeks.

## 2013-06-25 NOTE — Telephone Encounter (Signed)
Discussed with patient. Patient verbalized understanding. 

## 2013-07-13 ENCOUNTER — Ambulatory Visit: Payer: 59 | Admitting: Nurse Practitioner

## 2013-08-19 ENCOUNTER — Telehealth (INDEPENDENT_AMBULATORY_CARE_PROVIDER_SITE_OTHER): Payer: Self-pay | Admitting: *Deleted

## 2013-08-19 NOTE — Telephone Encounter (Signed)
For the last 3 months, she is having right side pain and it has increasingly getting worse for the last 2 nights. Samantha Wang's return phone number is [phone]. Was last seen on 10/06/12.

## 2013-08-19 NOTE — Telephone Encounter (Signed)
Patient states that she has been having this pain for 3 months. Right Quadrant pain,diaphram area. It is especially noted at night and sometimes after a meal but sometimes not. She describes it as a cramping pain, she experiences nausea with the pain and sometimes it is just there. No fever and no changes in her bowel movement. She has taken Tylenol for the pain and takes her reflux medication as directed. Dr.Rehman was paged.

## 2013-08-19 NOTE — Telephone Encounter (Signed)
[facility] has been scheduled an apt for Friday, 08/21/13 at 9:00 am with Dr. Karilyn Cota. She also has an apt at the same time with her PCP. Patient was agreeable with apt date and time.  Voice understood.

## 2013-08-19 NOTE — Telephone Encounter (Signed)
Per Dr.Rehman bring the patient into the office to see him on Friday , October 31 st. Lupita Leash ,Receptionist , was made aware and she stated that she was going to call patient and  make her aware.

## 2013-08-21 ENCOUNTER — Encounter (INDEPENDENT_AMBULATORY_CARE_PROVIDER_SITE_OTHER): Payer: Self-pay | Admitting: Internal Medicine

## 2013-08-21 ENCOUNTER — Ambulatory Visit: Payer: 59 | Admitting: Nurse Practitioner

## 2013-08-21 ENCOUNTER — Ambulatory Visit (HOSPITAL_COMMUNITY)
Admission: RE | Admit: 2013-08-21 | Discharge: 2013-08-21 | Disposition: A | Discharge status: Home / Self Care | Payer: 59 | Source: Ambulatory Visit | Attending: Internal Medicine | Admitting: Internal Medicine

## 2013-08-21 ENCOUNTER — Other Ambulatory Visit (INDEPENDENT_AMBULATORY_CARE_PROVIDER_SITE_OTHER): Payer: Self-pay | Admitting: Internal Medicine

## 2013-08-21 ENCOUNTER — Ambulatory Visit (INDEPENDENT_AMBULATORY_CARE_PROVIDER_SITE_OTHER): Payer: 59 | Admitting: Internal Medicine

## 2013-08-21 VITALS — BP 104/62 | HR 76 | Temp 98.9°F | Ht 62.0 in | Wt 144.4 lb

## 2013-08-21 DIAGNOSIS — R1011 Right upper quadrant pain: Secondary | ICD-10-CM

## 2013-08-21 DIAGNOSIS — K279 Peptic ulcer, site unspecified, unspecified as acute or chronic, without hemorrhage or perforation: Secondary | ICD-10-CM

## 2013-08-21 LAB — HEPATIC FUNCTION PANEL
ALT: 8 U/L (ref 0–35)
AST: 12 U/L (ref 0–37)
Albumin: 4.1 g/dL (ref 3.5–5.2)
Alkaline Phosphatase: 38 U/L — ABNORMAL LOW (ref 39–117)
Indirect Bilirubin: 0.3 mg/dL (ref 0.0–0.9)
Total Protein: 6.8 g/dL (ref 6.0–8.3)

## 2013-08-21 LAB — CBC
HCT: 34.7 % — ABNORMAL LOW (ref 36.0–46.0)
Hemoglobin: 11.5 g/dL — ABNORMAL LOW (ref 12.0–15.0)
MCH: 26.6 pg (ref 26.0–34.0)
MCHC: 33.1 g/dL (ref 30.0–36.0)
RDW: 15.8 % — ABNORMAL HIGH (ref 11.5–15.5)

## 2013-08-21 MED ORDER — HYDROCODONE-ACETAMINOPHEN 5-300 MG PO TABS: 1.0000 | ORAL_TABLET | Freq: Three times a day (TID) | ORAL | Status: DC | PRN

## 2013-08-21 MED ORDER — OMEPRAZOLE 20 MG PO CPDR: 20.0000 mg | DELAYED_RELEASE_CAPSULE | Freq: Two times a day (BID) | ORAL | Status: DC

## 2013-08-21 NOTE — Patient Instructions (Signed)
Hemoccult x1. Physician will contact her with results of blood work and ultrasound.

## 2013-08-21 NOTE — Progress Notes (Signed)
Presenting complaint;  Right upper quadrant abdominal pain of 2 months duration.  Subjective:  Patient is a 42 year old Caucasian female who has history of nonhealing gastric ulcer and GERD who was last seen in December 2013. She was doing well until about 2 months ago when she began to have sharp gnawing pain under the right rib cage. Initially pain was mild occurring 2-3 days in a row and then return after a week or two. Over the last week or so she has noted worsening pain. She has noted nausea and pain seemed to occur after meals. This week she was not able to sleep for 3 nights because of abdominal pain which seemed to migrate to her chest. She has not experienced vomiting heartburn dysphagia melena or rectal bleeding. Her bowels are irregular. She has not taken Levsin lately. She did try Tylenol did not alleviate her pain. She does not take OTC NSAIDs. She also denies fever chills hematuria or dysuria. She takes item usually once a week but daily for a few days when she is having her periods. She has good appetite and her weight has been stable. Patient has history of nonhealing gastric ulcer. Last EGD was in January 2012 and biopsy from this ulcer revealed benign etiology and H. pylori stains were negative.  Current Medications: Current Outpatient Prescriptions  Medication Sig Dispense Refill  . clonazePAM (KLONOPIN) 0.5 MG tablet as needed. 1/2 to 1 po BID prn anxiety      . fexofenadine (ALLEGRA) 180 MG tablet Take 180 mg by mouth as needed.      . fluticasone (FLONASE) 50 MCG/ACT nasal spray 2 sprays each nostril qd prn congestion  16 g  11  . hyoscyamine (LEVSIN SL) 0.125 MG SL tablet Place 0.125 mg under the tongue every 4 (four) hours as needed for cramping.      Marland Kitchen omeprazole (PRILOSEC) 20 MG capsule TAKE ONE CAPSULE BY MOUTH EVERY DAY  30 capsule  11  . Ferrous Sulfate Dried (SLOW IRON PO) Take by mouth. As Needed        No current facility-administered medications for this visit.      Objective: Blood pressure 104/62, pulse 76, temperature 98.9 F (37.2 C), height 5\' 2"  (1.575 m), weight 144 lb 6.4 oz (65.499 kg). Patient is alert and in no acute distress. Conjunctiva is pink. Sclera is nonicteric Oropharyngeal mucosa is normal. No neck masses or thyromegaly noted. Cardiac exam with regular rhythm normal S1 and S2. No murmur or gallop noted. Lungs are clear to auscultation. Abdomen. Bowel sounds are normal. Abdomen is symmetrical soft and nontender without organomegaly or masses.  No LE edema or clubbing noted.  Labs/studies Results: CBC from 01/29/2013. WBC 5.0, H&H 11.3 and 35.9 and platelet count 288K LFTs on 01/14/2013 were normal. Upper abdominal ultrasound from 07/05/2005 was within normal limits.  Assessment:  #1. Right upper quadrant abdominal pain associated with nausea and pain seem to occur more often after meals. Need to rule out biliary tract disease. Pain may very well be secondary to chronic peptic ulcer disease. #2. History of nonhealing gastric ulcer. She has not experienced any abdominal pain due to ulcer in the past. #3. GERD. Symptoms well controlled with PPI.   Plan:  Increase omeprazole to 20 mg by mouth twice a day. Hemoccults x1. CBC and LFTs. Vicodin 5/300 one by mouth 3 times a day when necessary prescription given for 20 doses. Upper abdominal ultrasound. If ultrasound and blood work are unremarkable will consider EGD.

## 2013-08-26 NOTE — Progress Notes (Signed)
Apt has been scheduled for 11/23/13 with Dr. Rehman.  

## 2013-09-01 ENCOUNTER — Telehealth (INDEPENDENT_AMBULATORY_CARE_PROVIDER_SITE_OTHER): Payer: Self-pay | Admitting: *Deleted

## 2013-09-01 DIAGNOSIS — Z8711 Personal history of peptic ulcer disease: Secondary | ICD-10-CM

## 2013-09-01 DIAGNOSIS — R1011 Right upper quadrant pain: Secondary | ICD-10-CM

## 2013-09-01 DIAGNOSIS — R110 Nausea: Secondary | ICD-10-CM

## 2013-09-01 NOTE — Telephone Encounter (Signed)
      Result(s)   Card 1: Negative      Card 2:    Card 3:     Completed by: Larose Hires , LPN   HEMOCCULT SENSA DEVELOPER: LOT#:  [id number] EXPIRATION DATE: 2016-05   HEMOCCULT SENSA CARD:  LOT#:  [id number] EXPIRATION DATE: [phone]   CARD CONTROL RESULTS:  POSITIVE:  Positive NEGATIVE: Negative    ADDITIONAL COMMENTS: Patient has been called and made aware of her results.

## 2013-09-01 NOTE — Telephone Encounter (Signed)
Stool is guaiac negative. 

## 2013-10-08 ENCOUNTER — Other Ambulatory Visit (INDEPENDENT_AMBULATORY_CARE_PROVIDER_SITE_OTHER): Payer: Self-pay | Admitting: Internal Medicine

## 2013-11-23 ENCOUNTER — Encounter (INDEPENDENT_AMBULATORY_CARE_PROVIDER_SITE_OTHER): Payer: Self-pay | Admitting: Internal Medicine

## 2013-11-23 ENCOUNTER — Ambulatory Visit (INDEPENDENT_AMBULATORY_CARE_PROVIDER_SITE_OTHER): Payer: 59 | Admitting: Internal Medicine

## 2013-11-23 VITALS — BP 118/70 | HR 74 | Temp 97.5°F | Resp 18 | Ht 62.0 in | Wt 149.3 lb

## 2013-11-23 DIAGNOSIS — K279 Peptic ulcer, site unspecified, unspecified as acute or chronic, without hemorrhage or perforation: Secondary | ICD-10-CM

## 2013-11-23 DIAGNOSIS — D509 Iron deficiency anemia, unspecified: Secondary | ICD-10-CM

## 2013-11-23 DIAGNOSIS — E559 Vitamin D deficiency, unspecified: Secondary | ICD-10-CM

## 2013-11-23 DIAGNOSIS — K219 Gastro-esophageal reflux disease without esophagitis: Secondary | ICD-10-CM

## 2013-11-23 DIAGNOSIS — K589 Irritable bowel syndrome without diarrhea: Secondary | ICD-10-CM

## 2013-11-23 LAB — CBC
HCT: 35 % — ABNORMAL LOW (ref 36.0–46.0)
Hemoglobin: 11.2 g/dL — ABNORMAL LOW (ref 12.0–15.0)
MCH: 26.3 pg (ref 26.0–34.0)
MCHC: 32 g/dL (ref 30.0–36.0)
MCV: 82.2 fL (ref 78.0–100.0)
PLATELETS: 347 10*3/uL (ref 150–400)
RBC: 4.26 MIL/uL (ref 3.87–5.11)
RDW: 15.5 % (ref 11.5–15.5)
WBC: 6.1 10*3/uL (ref 4.0–10.5)

## 2013-11-23 NOTE — Patient Instructions (Signed)
Physician will contact you with results of blood work. 

## 2013-11-23 NOTE — Progress Notes (Signed)
Presenting complaint;  Followup for peptic ulcer disease, GERD and diarrhea.  Subjective:  Patient is 43 year old Caucasian female who has history of refractory peptic ulcer disease and GERD who was last seen 3 months ago for right upper quadrant abdominal pain. Her stool was guaiac negative. LFTs and ultrasound were within normal limits. Omeprazole dose was doubled. She noted resolution of her abdominal pain. She is not complaining of lower abdominal cramps, urgency and diarrhea when she is upset or stressed out. She has been using hyoscyamine on an as-needed basis and it helps. She states her IBS symptoms have been worse lately but she has been under a lot of stress. She uses clonazepam prescription on an as-needed basis. She was begun on citalopram by Ms. Caro Hightarol Hoskins NP S. recent family medicine she experienced side effects and was switched to Lexapro 10 mg daily which she has not started yet. She is not having upper abdominal pain anymore. On most days she takes one omeprazole. However every now and then she has relapse of chest and interscapular pain and she goes back on twice daily omeprazole for few days. She has good appetite. She has gained 5 pounds since her last visit. She denies melena or rectal bleeding. She does not take iron except when she is under periods because of constipation. She does not take NSAIDs.  Current Medications: Current Outpatient Prescriptions  Medication Sig Dispense Refill  . b complex vitamins tablet Take 1 tablet by mouth.      . Cholecalciferol (VITAMIN D3) 1000 UNITS CHEW Chew by mouth. Patient states that she takes 2-3 per week.      . clonazePAM (KLONOPIN) 0.5 MG tablet as needed. 1/2 to 1 po BID prn anxiety      . ferrous sulfate 324 (65 FE) MG TBEC Take by mouth. Patient states that she takes once or twice per month while on her menses.      . fexofenadine (ALLEGRA) 180 MG tablet Take 180 mg by mouth daily.      . fluticasone (FLONASE) 50 MCG/ACT  nasal spray 2 sprays each nostril qd prn congestion  16 g  11  . hyoscyamine (LEVSIN SL) 0.125 MG SL tablet PLACE ONE TABLET UNDER THE TONGUE THREE TIMES DAILY WITH MEALS AS NEEDED FOR  CRAMPING  90 tablet  3  . omeprazole (PRILOSEC) 20 MG capsule Take 1 capsule (20 mg total) by mouth 2 (two) times daily before a meal.  60 capsule  5   No current facility-administered medications for this visit.     Objective: Blood pressure 118/70, pulse 74, temperature 97.5 F (36.4 C), temperature source Oral, resp. rate 18, height 5\' 2"  (1.575 m), weight 149 lb 4.8 oz (67.722 kg), last menstrual period 11/22/2013.  patient is alert and in no acute distress. Conjunctiva is pink. Sclera is nonicteric Oropharyngeal mucosa is normal. No neck masses or thyromegaly noted. Cardiac exam with regular rhythm normal S1 and S2. No murmur or gallop noted. Lungs are clear to auscultation. Abdomen symmetrical. Bowel sounds are normal. Palpation reveals soft abdomen with mild tenderness hypogastric region no organomegaly or masses.  No LE edema or clubbing noted.    Assessment:  #1.Refractory peptic ulcer disease. Peptic ulcer disease was diagnosed initially in 2010 and last EGD was 3 years ago revealing small sliding-type hernia and antral ulcer and biopsy was benign. She is presently not having any symptoms. #2.GERD. Symptoms are well controlled with PPI at 20-40 mg daily. #3. Irritable bowel syndrome. She will continue  to use hyoscyamine on an as-needed basis. If symptoms do not improve with Lexapro may consider switching her to a long acting anti-spasmodic. #4. History of vitamin D deficiency. #5. History of iron deficiency anemia.   Plan:  Will check CBC, serum magnesium and vitamin D level. Try Flintstones with iron 1-2 tablets daily. Office visit in 6 months.

## 2013-11-24 LAB — VITAMIN D 25 HYDROXY (VIT D DEFICIENCY, FRACTURES): Vit D, 25-Hydroxy: 40 ng/mL (ref 30–89)

## 2013-11-24 LAB — MAGNESIUM: Magnesium: 1.9 mg/dL (ref 1.5–2.5)

## 2013-11-30 ENCOUNTER — Telehealth (INDEPENDENT_AMBULATORY_CARE_PROVIDER_SITE_OTHER): Payer: Self-pay | Admitting: *Deleted

## 2013-11-30 DIAGNOSIS — K279 Peptic ulcer, site unspecified, unspecified as acute or chronic, without hemorrhage or perforation: Secondary | ICD-10-CM

## 2013-11-30 NOTE — Telephone Encounter (Signed)
Per Dr.Rehman the patient will need to have labs drawn in 2 months. 

## 2013-12-01 ENCOUNTER — Ambulatory Visit (INDEPENDENT_AMBULATORY_CARE_PROVIDER_SITE_OTHER): Payer: 59 | Admitting: Family Medicine

## 2013-12-01 ENCOUNTER — Encounter: Payer: Self-pay | Admitting: Family Medicine

## 2013-12-01 VITALS — BP 112/72 | Temp 98.3°F | Ht 62.0 in | Wt 149.4 lb

## 2013-12-01 DIAGNOSIS — J329 Chronic sinusitis, unspecified: Secondary | ICD-10-CM

## 2013-12-01 DIAGNOSIS — J310 Chronic rhinitis: Secondary | ICD-10-CM

## 2013-12-01 MED ORDER — CEFDINIR 300 MG PO CAPS: 300.0000 mg | ORAL_CAPSULE | Freq: Two times a day (BID) | ORAL | Status: DC

## 2013-12-01 NOTE — Progress Notes (Signed)
Subjective:    Patient ID: Samantha Wang, female    DOB: Mar 19, 1971, 43 y.o.   MRN: [id number]  Sinus Problem This is a new problem. The current episode started more than 1 month ago. Associated symptoms include congestion, headaches and sinus pressure.   No fever  prog pressure increased laetley  Started  One mo ago  Uses flonase and all year round  No chest cong or cough  Son sick but acutely  Review of Systems  HENT: Positive for congestion and sinus pressure.   Neurological: Positive for headaches.   no vomiting no diarrhea no rash ROS otherwise negative     Objective:   Physical Exam  Alert no apparent distress. Moderate malaise. H&T moderate his congestion frontal tenderness. Pharynx erythema neck supple. Lungs clear. Heart regular in rhythm.      Assessment & Plan:  Impression 1 subacute sinusitis plan Omnicef twice a day 10 days. Symptomatic care discussed. Warning signs discussed. WSL

## 2014-01-11 ENCOUNTER — Encounter: Payer: Self-pay | Admitting: Certified Nurse Midwife

## 2014-01-11 ENCOUNTER — Ambulatory Visit (INDEPENDENT_AMBULATORY_CARE_PROVIDER_SITE_OTHER): Payer: 59 | Admitting: Certified Nurse Midwife

## 2014-01-11 VITALS — BP 120/70 | HR 78 | Resp 16 | Ht 62.75 in | Wt 142.0 lb

## 2014-01-11 DIAGNOSIS — Z0000 Encounter for general adult medical examination without abnormal findings: Secondary | ICD-10-CM

## 2014-01-11 DIAGNOSIS — N943 Premenstrual tension syndrome: Secondary | ICD-10-CM

## 2014-01-11 DIAGNOSIS — Z01419 Encounter for gynecological examination (general) (routine) without abnormal findings: Secondary | ICD-10-CM

## 2014-01-11 LAB — POCT URINALYSIS DIPSTICK
Bilirubin, UA: NEGATIVE
Glucose, UA: NEGATIVE
KETONES UA: NEGATIVE
Leukocytes, UA: NEGATIVE
Nitrite, UA: NEGATIVE
PROTEIN UA: NEGATIVE
RBC UA: NEGATIVE
Urobilinogen, UA: NEGATIVE
pH, UA: 5

## 2014-01-11 MED ORDER — NORETHIN ACE-ETH ESTRAD-FE 1-20 MG-MCG(24) PO TABS: 1.0000 | ORAL_TABLET | Freq: Every day | ORAL | Status: DC

## 2014-01-11 NOTE — Patient Instructions (Signed)
General topics  Next pap or exam is  due in 1 year Take a Women's multivitamin Take 1200 mg. of calcium daily - prefer dietary If any concerns in interim to call back  Breast Self-Awareness Practicing breast self-awareness may pick up problems early, prevent significant medical complications, and possibly save your life. By practicing breast self-awareness, you can become familiar with how your breasts look and feel and if your breasts are changing. This allows you to notice changes early. It can also offer you some reassurance that your breast health is good. One way to learn what is normal for your breasts and whether your breasts are changing is to do a breast self-exam. If you find a lump or something that was not present in the past, it is best to contact your caregiver right away. Other findings that should be evaluated by your caregiver include nipple discharge, especially if it is bloody; skin changes or reddening; areas where the skin seems to be pulled in (retracted); or new lumps and bumps. Breast pain is seldom associated with cancer (malignancy), but should also be evaluated by a caregiver. BREAST SELF-EXAM The best time to examine your breasts is 5 7 days after your menstrual period is over.  ExitCare Patient Information 2013 [employer].   Exercise to Stay Healthy Exercise helps you become and stay healthy. EXERCISE IDEAS AND TIPS Choose exercises that:  You enjoy.  Fit into your day. You do not need to exercise really hard to be healthy. You can do exercises at a slow or medium level and stay healthy. You can:  Stretch before and after working out.  Try yoga, Pilates, or tai chi.  Lift weights.  Walk fast, swim, jog, run, climb stairs, bicycle, dance, or rollerskate.  Take aerobic classes. Exercises that burn about 150 calories:  Running 1  miles in 15 minutes.  Playing volleyball for 45 to 60 minutes.  Washing and waxing a car for 45 to 60  minutes.  Playing touch football for 45 minutes.  Walking 1  miles in 35 minutes.  Pushing a stroller 1  miles in 30 minutes.  Playing basketball for 30 minutes.  Raking leaves for 30 minutes.  Bicycling 5 miles in 30 minutes.  Walking 2 miles in 30 minutes.  Dancing for 30 minutes.  Shoveling snow for 15 minutes.  Swimming laps for 20 minutes.  Walking up stairs for 15 minutes.  Bicycling 4 miles in 15 minutes.  Gardening for 30 to 45 minutes.  Jumping rope for 15 minutes.  Washing windows or floors for 45 to 60 minutes. Document Released: 11/10/2010 Document Revised: 12/31/2011 Document Reviewed: 11/10/2010 ExitCare Patient Information 2013 [employer].   Other topics ( that may be useful information):    Sexually Transmitted Disease Sexually transmitted disease (STD) refers to any infection that is passed from person to person during sexual activity. This may happen by way of saliva, semen, blood, vaginal mucus, or urine. Common STDs include:  Gonorrhea.  Chlamydia.  Syphilis.  HIV/AIDS.  Genital herpes.  Hepatitis B and C.  Trichomonas.  Human papillomavirus (HPV).  Pubic lice. CAUSES  An STD may be spread by bacteria, virus, or parasite. A person can get an STD by:  Sexual intercourse with an infected person.  Sharing sex toys with an infected person.  Sharing needles with an infected person.  Having intimate contact with the genitals, mouth, or rectal areas of an infected person. SYMPTOMS  Some people may not have any symptoms, but   they can still pass the infection to others. Different STDs have different symptoms. Symptoms include:  Painful or bloody urination.  Pain in the pelvis, abdomen, vagina, anus, throat, or eyes.  Skin rash, itching, irritation, growths, or sores (lesions). These usually occur in the genital or anal area.  Abnormal vaginal discharge.  Penile discharge in men.  Soft, flesh-colored skin growths in the  genital or anal area.  Fever.  Pain or bleeding during sexual intercourse.  Swollen glands in the groin area.  Yellow skin and eyes (jaundice). This is seen with hepatitis. DIAGNOSIS  To make a diagnosis, your caregiver may:  Take a medical history.  Perform a physical exam.  Take a specimen (culture) to be examined.  Examine a sample of discharge under a microscope.  Perform blood test TREATMENT   Chlamydia, gonorrhea, trichomonas, and syphilis can be cured with antibiotic medicine.  Genital herpes, hepatitis, and HIV can be treated, but not cured, with prescribed medicines. The medicines will lessen the symptoms.  Genital warts from HPV can be treated with medicine or by freezing, burning (electrocautery), or surgery. Warts may come back.  HPV is a virus and cannot be cured with medicine or surgery.However, abnormal areas may be followed very closely by your caregiver and may be removed from the cervix, vagina, or vulva through office procedures or surgery. If your diagnosis is confirmed, your recent sexual partners need treatment. This is true even if they are symptom-free or have a negative culture or evaluation. They should not have sex until their caregiver says it is okay. HOME CARE INSTRUCTIONS  All sexual partners should be informed, tested, and treated for all STDs.  Take your antibiotics as directed. Finish them even if you start to feel better.  Only take over-the-counter or prescription medicines for pain, discomfort, or fever as directed by your caregiver.  Rest.  Eat a balanced diet and drink enough fluids to keep your urine clear or pale yellow.  Do not have sex until treatment is completed and you have followed up with your caregiver. STDs should be checked after treatment.  Keep all follow-up appointments, Pap tests, and blood tests as directed by your caregiver.  Only use latex condoms and water-soluble lubricants during sexual activity. Do not use  petroleum jelly or oils.  Avoid alcohol and illegal drugs.  Get vaccinated for HPV and hepatitis. If you have not received these vaccines in the past, talk to your caregiver about whether one or both might be right for you.  Avoid risky sex practices that can break the skin. The only way to avoid getting an STD is to avoid all sexual activity.Latex condoms and dental dams (for oral sex) will help lessen the risk of getting an STD, but will not completely eliminate the risk. SEEK MEDICAL CARE IF:   You have a fever.  You have any new or worsening symptoms. Document Released: 12/29/2002 Document Revised: 12/31/2011 Document Reviewed: 01/05/2011 [facility] Patient Information 2013 [employer].    Domestic Abuse You are being battered or abused if someone close to you hits, pushes, or physically hurts you in any way. You also are being abused if you are forced into activities. You are being sexually abused if you are forced to have sexual contact of any kind. You are being emotionally abused if you are made to feel worthless or if you are constantly threatened. It is important to remember that help is available. No one has the right to abuse you. PREVENTION OF FURTHER  ABUSE  Learn the warning signs of danger. This varies with situations but may include: the use of alcohol, threats, isolation from friends and family, or forced sexual contact. Leave if you feel that violence is going to occur.  If you are attacked or beaten, report it to the police so the abuse is documented. You do not have to press charges. The police can protect you while you or the attackers are leaving. Get the officer's name and badge number and a copy of the report.  Find someone you can trust and tell them what is happening to you: your caregiver, a nurse, clergy member, close friend or family member. Feeling ashamed is natural, but remember that you have done nothing wrong. No one deserves abuse. Document Released:  10/05/2000 Document Revised: 12/31/2011 Document Reviewed: 12/14/2010 ExitCare Patient Information 2013 [employer].    How Much is Too Much Alcohol? Drinking too much alcohol can cause injury, accidents, and health problems. These types of problems can include:   Car crashes.  Falls.  Family fighting (domestic violence).  Drowning.  Fights.  Injuries.  Burns.  Damage to certain organs.  Having a baby with birth defects. ONE DRINK CAN BE TOO MUCH WHEN YOU ARE:  Working.  Pregnant or breastfeeding.  Taking medicines. Ask your doctor.  Driving or planning to drive. If you or someone you know has a drinking problem, get help from a doctor.  Document Released: 08/04/2009 Document Revised: 12/31/2011 Document Reviewed: 08/04/2009 ExitCare Patient Information 2013 [employer].   Smoking Hazards Smoking cigarettes is extremely bad for your health. Tobacco smoke has over 200 known poisons in it. There are over 60 chemicals in tobacco smoke that cause cancer. Some of the chemicals found in cigarette smoke include:   Cyanide.  Benzene.  Formaldehyde.  Methanol (wood alcohol).  Acetylene (fuel used in welding torches).  Ammonia. Cigarette smoke also contains the poisonous gases nitrogen oxide and carbon monoxide.  Cigarette smokers have an increased risk of many serious medical problems and Smoking causes approximately:  90% of all lung cancer deaths in men.  80% of all lung cancer deaths in women.  90% of deaths from chronic obstructive lung disease. Compared with nonsmokers, smoking increases the risk of:  Coronary heart disease by 2 to 4 times.  Stroke by 2 to 4 times.  Men developing lung cancer by 23 times.  Women developing lung cancer by 13 times.  Dying from chronic obstructive lung diseases by 12 times.  . Smoking is the most preventable cause of death and disease in our society.  WHY IS SMOKING ADDICTIVE?  Nicotine is the chemical  agent in tobacco that is capable of causing addiction or dependence.  When you smoke and inhale, nicotine is absorbed rapidly into the bloodstream through your lungs. Nicotine absorbed through the lungs is capable of creating a powerful addiction. Both inhaled and non-inhaled nicotine may be addictive.  Addiction studies of cigarettes and spit tobacco show that addiction to nicotine occurs mainly during the teen years, when young people begin using tobacco products. WHAT ARE THE BENEFITS OF QUITTING?  There are many health benefits to quitting smoking.   Likelihood of developing cancer and heart disease decreases. Health improvements are seen almost immediately.  Blood pressure, pulse rate, and breathing patterns start returning to normal soon after quitting. QUITTING SMOKING   American Lung Association - 1-800-LUNGUSA  American Cancer Society - 1-800-ACS-2345 Document Released: 11/15/2004 Document Revised: 12/31/2011 Document Reviewed: 07/20/2009 ExitCare Patient Information 2013 ExitCare,   LLC.   Stress Management Stress is a state of physical or mental tension that often results from changes in your life or normal routine. Some common causes of stress are:  Death of a loved one.  Injuries or severe illnesses.  Getting fired or changing jobs.  Moving into a new home. Other causes may be:  Sexual problems.  Business or financial losses.  Taking on a large debt.  Regular conflict with someone at home or at work.  Constant tiredness from lack of sleep. It is not just bad things that are stressful. It may be stressful to:  Win the lottery.  Get married.  Buy a new car. The amount of stress that can be easily tolerated varies from person to person. Changes generally cause stress, regardless of the types of change. Too much stress can affect your health. It may lead to physical or emotional problems. Too little stress (boredom) may also become stressful. SUGGESTIONS TO  REDUCE STRESS:  Talk things over with your family and friends. It often is helpful to share your concerns and worries. If you feel your problem is serious, you may want to get help from a professional counselor.  Consider your problems one at a time instead of lumping them all together. Trying to take care of everything at once may seem impossible. List all the things you need to do and then start with the most important one. Set a goal to accomplish 2 or 3 things each day. If you expect to do too many in a single day you will naturally fail, causing you to feel even more stressed.  Do not use alcohol or drugs to relieve stress. Although you may feel better for a short time, they do not remove the problems that caused the stress. They can also be habit forming.  Exercise regularly - at least 3 times per week. Physical exercise can help to relieve that "uptight" feeling and will relax you.  The shortest distance between despair and hope is often a good night's sleep.  Go to bed and get up on time allowing yourself time for appointments without being rushed.  Take a short "time-out" period from any stressful situation that occurs during the day. Close your eyes and take some deep breaths. Starting with the muscles in your face, tense them, hold it for a few seconds, then relax. Repeat this with the muscles in your neck, shoulders, hand, stomach, back and legs.  Take good care of yourself. Eat a balanced diet and get plenty of rest.  Schedule time for having fun. Take a break from your daily routine to relax. HOME CARE INSTRUCTIONS   Call if you feel overwhelmed by your problems and feel you can no longer manage them on your own.  Return immediately if you feel like hurting yourself or someone else. Document Released: 04/03/2001 Document Revised: 12/31/2011 Document Reviewed: 11/24/2007 ExitCare Patient Information 2013 ExitCare, [employer].   

## 2014-01-11 NOTE — Progress Notes (Signed)
43 y.o. G50P2002 Married Caucasian Fe here for annual exam. Periods normal in amount and duration, but PMS has increased with migraine headache without aura for 2 days prior to menses and during days 1-2 of cycle. She also experiences loose stools during this time also. The symptoms resolve on the third of cycle. Patient interested in cycle control if possible with OCP. Patient has taken in past without problems. Sees PCP prn only. Fasted for labs today. No other health issues.  Patient's last menstrual period was 01/06/2014.          Sexually active: yes  The current method of family planning is vasectomy.    Exercising: yes  walking Smoker:  no  Health Maintenance: Pap: 01-07-12 neg HPV HR neg MMG:  02-05-13 normal Colonoscopy:  none BMD:   none TDaP:  2011 Labs: Poct urine-neg Self breast exam: done occ   reports that she has never smoked. She has never used smokeless tobacco. She reports that she does not drink alcohol or use illicit drugs.  Past Medical History  Diagnosis Date  . Anemia   . Peptic ulcer disease   . Breast nodule     left   . H/O cesarean section     history of 2 c-sections    . Reflux   . Allergy   . Anxiety     Past Surgical History  Procedure Laterality Date  . Upper gastrointestinal endoscopy    . Cesarean section  [id number]    Current Outpatient Prescriptions  Medication Sig Dispense Refill  . b complex vitamins tablet Take 1 tablet by mouth.      . Cholecalciferol (VITAMIN D3) 1000 UNITS CHEW Chew by mouth. Patient states that she takes 2-3 per week.      . clonazePAM (KLONOPIN) 0.5 MG tablet as needed. 1/2 to 1 po BID prn anxiety      . ferrous sulfate 324 (65 FE) MG TBEC Take by mouth. Patient states that she takes once or twice per month while on her menses.      . fluticasone (FLONASE) 50 MCG/ACT nasal spray daily. 2 sprays each nostril qd prn congestion      . hyoscyamine (LEVSIN SL) 0.125 MG SL tablet PLACE ONE TABLET UNDER THE TONGUE THREE  TIMES DAILY WITH MEALS AS NEEDED FOR  CRAMPING  90 tablet  3  . loratadine (CLARITIN) 10 MG tablet Take 10 mg by mouth daily.      Marland Kitchen omeprazole (PRILOSEC) 20 MG capsule Take 1 capsule (20 mg total) by mouth 2 (two) times daily before a meal.  60 capsule  5   No current facility-administered medications for this visit.    Family History  Problem Relation Age of Onset  . Healthy Mother   . Hyperlipidemia Father   . Healthy Brother   . Healthy Son   . Healthy Daughter     ROS:  Pertinent items are noted in HPI.  Otherwise, a comprehensive ROS was negative.  Exam:   BP 120/70  Pulse 78  Resp 16  Ht 5' 2.75" (1.594 m)  Wt 142 lb (64.411 kg)  BMI 25.35 kg/m2  LMP 01/06/2014 Height: 5' 2.75" (159.4 cm)  Ht Readings from Last 3 Encounters:  01/11/14 5' 2.75" (1.594 m)  12/01/13 5\' 2"  (1.575 m)  11/23/13 5\' 2"  (1.575 m)    General appearance: alert, cooperative and appears stated age Head: Normocephalic, without obvious abnormality, atraumatic Neck: no adenopathy, supple, symmetrical, trachea midline and thyroid normal to  inspection and palpation and non-palpable Lungs: clear to auscultation bilaterally Breasts: normal appearance, no masses or tenderness, No nipple retraction or dimpling, No nipple discharge or bleeding, No axillary or supraclavicular adenopathy Heart: regular rate and rhythm Abdomen: soft, non-tender; no masses,  no organomegaly Extremities: extremities normal, atraumatic, no cyanosis or edema Skin: Skin color, texture, turgor normal. No rashes or lesions Lymph nodes: Cervical, supraclavicular, and axillary nodes normal. No abnormal inguinal nodes palpated Neurologic: Grossly normal   Pelvic: External genitalia:  no lesions              Urethra:  normal appearing urethra with no masses, tenderness or lesions              Bartholin's and Skene's: normal                 Vagina: normal appearing vagina with normal color and discharge, no lesions               Cervix: non tender, normal appearance              Pap taken: yes Bimanual Exam:  Uterus:  normal size, contour, position, consistency, mobility, non-tender and anteverted              Adnexa: normal adnexa and no mass, fullness, tenderness               Rectovaginal: Confirms               Anus:  normal sphincter tone, no lesions  A:  Well Woman with normal exam  Contraception spouse vasectomy  Dysmenorrhea and PMS increase with menses, desires cycle control  Fasting labs  P:   Reviewed health and wellness pertinent to exam  Discussed risks/benefits of OCP for symptom relief. Questions addressed. Patient would like to try.  Rx Lomedia 24 see order with instructions given to patient regarding expectations.Marland Kitchen. Re-evaluate in 3 months.  Labs:TSH,Lipid panel,CMP, Vitamin D  Pap smear as per guidelines   Mammogram due in 4/15, do yearly pap smear taken today with reflex  counseled on breast self exam, mammography screening,  adequate intake of calcium and vitamin D, diet and exercise  return annually, 3 months as above  An After Visit Summary was printed and given to the patient.

## 2014-01-11 NOTE — Progress Notes (Signed)
Reviewed personally.  M. Suzanne Alailah Safley, MD.  

## 2014-01-12 LAB — LIPID PANEL
CHOL/HDL RATIO: 2.8 ratio
CHOLESTEROL: 139 mg/dL (ref 0–200)
HDL: 49 mg/dL (ref >39)
LDL Cholesterol: 72 mg/dL (ref 0–99)
Triglycerides: 88 mg/dL (ref <150)
VLDL: 18 mg/dL (ref 0–40)

## 2014-01-12 LAB — COMPREHENSIVE METABOLIC PANEL
ALBUMIN: 4.3 g/dL (ref 3.5–5.2)
ALK PHOS: 41 U/L (ref 39–117)
ALT: 8 U/L (ref 0–35)
AST: 13 U/L (ref 0–37)
BUN: 9 mg/dL (ref 6–23)
CALCIUM: 9 mg/dL (ref 8.4–10.5)
CHLORIDE: 103 meq/L (ref 96–112)
CO2: 27 mEq/L (ref 19–32)
Creat: 0.66 mg/dL (ref 0.50–1.10)
Glucose, Bld: 97 mg/dL (ref 70–99)
POTASSIUM: 3.8 meq/L (ref 3.5–5.3)
SODIUM: 141 meq/L (ref 135–145)
TOTAL PROTEIN: 7 g/dL (ref 6.0–8.3)
Total Bilirubin: 0.2 mg/dL (ref 0.2–1.2)

## 2014-01-12 LAB — TSH: TSH: 1.452 u[IU]/mL (ref 0.350–4.500)

## 2014-01-12 LAB — VITAMIN D 25 HYDROXY (VIT D DEFICIENCY, FRACTURES): Vit D, 25-Hydroxy: 49 ng/mL (ref 30–89)

## 2014-01-13 ENCOUNTER — Other Ambulatory Visit (INDEPENDENT_AMBULATORY_CARE_PROVIDER_SITE_OTHER): Payer: Self-pay | Admitting: *Deleted

## 2014-01-13 ENCOUNTER — Encounter (INDEPENDENT_AMBULATORY_CARE_PROVIDER_SITE_OTHER): Payer: Self-pay | Admitting: *Deleted

## 2014-01-13 DIAGNOSIS — K279 Peptic ulcer, site unspecified, unspecified as acute or chronic, without hemorrhage or perforation: Secondary | ICD-10-CM

## 2014-01-13 LAB — IPS PAP TEST WITH REFLEX TO HPV

## 2014-02-10 ENCOUNTER — Telehealth (INDEPENDENT_AMBULATORY_CARE_PROVIDER_SITE_OTHER): Payer: Self-pay | Admitting: *Deleted

## 2014-02-10 NOTE — Telephone Encounter (Signed)
Samantha CornfieldStephanie had her lab work done on 01/28/14 and would like to get the results. The return phone number is [phone].

## 2014-02-10 NOTE — Telephone Encounter (Signed)
Noted  

## 2014-02-10 NOTE — Telephone Encounter (Signed)
Patient called. She had her lab drawn on 01/28/14 @ 5:15 Pm. Patient states that she felt when she went in that there may be a problem. Unfriendly lab tech. Patient cannot recall her name but did provide a description of her. I will share this with the phlebotomy supervisor,Era Matkins. I have called the both the drawing station and the [phone]/result line. Drawing station has no information on that patient for that date. [phone] could see the lab order but state that they did not get the specimen. A message has been left for Ms. Matkins to call me. Patient is going to return to the lab to have this lab redrawn.

## 2014-02-11 NOTE — Telephone Encounter (Signed)
I have spoken with Era. She is going to review this and also reach out to the patient.

## 2014-03-07 ENCOUNTER — Other Ambulatory Visit (INDEPENDENT_AMBULATORY_CARE_PROVIDER_SITE_OTHER): Payer: Self-pay | Admitting: Internal Medicine

## 2014-04-20 ENCOUNTER — Ambulatory Visit (INDEPENDENT_AMBULATORY_CARE_PROVIDER_SITE_OTHER): Payer: 59 | Admitting: Certified Nurse Midwife

## 2014-04-20 ENCOUNTER — Encounter: Payer: Self-pay | Admitting: Certified Nurse Midwife

## 2014-04-20 VITALS — BP 100/70 | HR 74 | Resp 16 | Ht 62.75 in | Wt 139.0 lb

## 2014-04-20 DIAGNOSIS — Z634 Disappearance and death of family member: Secondary | ICD-10-CM

## 2014-04-20 DIAGNOSIS — F4321 Adjustment disorder with depressed mood: Secondary | ICD-10-CM

## 2014-04-20 DIAGNOSIS — G43909 Migraine, unspecified, not intractable, without status migrainosus: Secondary | ICD-10-CM | POA: Insufficient documentation

## 2014-04-20 DIAGNOSIS — G43829 Menstrual migraine, not intractable, without status migrainosus: Secondary | ICD-10-CM

## 2014-04-20 DIAGNOSIS — N946 Dysmenorrhea, unspecified: Secondary | ICD-10-CM

## 2014-04-20 NOTE — Progress Notes (Signed)
43 y.o. Married Caucasian G2P2002 here for evaluation of Lomedia 24 Fe initiated on 01/11/14 for cycle control of Dysmenorrhea, increased PMS and menstrual migraine. Menses duration 3 days with light flow. Patient taking medication as prescribed. Denies missed pills, nausea, DVT warning signs or symptoms,  breakthrough bleeding, or other changes.   Keeping menses calendar. Patient is happy with no cramping or pain with periods. Headaches are hard to evaluate due to social stress with son's death from suicide who was recently released from the army. Patient coping with family support.  She feels her headaches have occurred due to crying and emotions. Happy with OCP so far and would like to continue.No other health issues today.  O: Healthy female, WD WN Affect: normal orientation X 3    A: History of dysmenorrhea, PMS and menstrual migraine with  Lomedia 24 FE working well. Social stress with son's death.  P: Continue Lomedia  24  as prescribed, has Rx. Patient will advise in 2 months if headaches have decreased.  Discussed grief counseling to help with her loss. Encouraged friend and family support. Offered my sympathy for her loss with prayers.  30  minutes spent with patient with >50% of time spent in face to face counseling.  RV prn

## 2014-04-20 NOTE — Patient Instructions (Signed)
Grief Reaction Grief is a normal response to the death of someone close to you. Feelings of fear, anger, and guilt can affect almost everyone who loses someone they love. Symptoms of depression are also common. These include problems with sleep, loss of appetite, and lack of energy. These grief reaction symptoms often last for weeks to months after a loss. They may also return during special times that remind you of the person you lost, such as an anniversary or birthday. Anxiety, insomnia, irritability, and deep depression may last beyond the period of normal grief. If you experience these feelings for 6 months or longer, you may have clinical depression. Clinical depression requires further medical attention. If you think that you have clinical depression, you should contact your caregiver. If you have a history of depression and or a family history of depression, you are at greater risk of clinical depression. You are also at greater risk of developing clinical depression if the loss was traumatic or the loss was of someone with whom you had unresolved issues.  A grief reaction can become complicated by being blocked. This means being unable to cry or express extreme emotions. This may prolong the grieving period and worsen the emotional effects of the loss. Mourning is a natural event in human life. A healthy grief reaction is one that is not blocked . It requires a time of sadness and readjustment.It is very important to share your sorrow and fear with others, especially close friends and family. Professional counselors and clergy can also help you process your grief. Document Released: 10/08/2005 Document Revised: 12/31/2011 Document Reviewed: 06/18/2006 ExitCare Patient Information 2015 [employer]. This information is not intended to replace advice given to you by your health care provider. Make sure you discuss any questions you have with your health care provider.  

## 2014-04-26 NOTE — Progress Notes (Signed)
Reviewed personally.  M. Suzanne Asiana Benninger, MD.  

## 2014-05-25 ENCOUNTER — Encounter (INDEPENDENT_AMBULATORY_CARE_PROVIDER_SITE_OTHER): Payer: Self-pay | Admitting: Internal Medicine

## 2014-05-25 ENCOUNTER — Ambulatory Visit (INDEPENDENT_AMBULATORY_CARE_PROVIDER_SITE_OTHER): Payer: 59 | Admitting: Internal Medicine

## 2014-05-25 VITALS — BP 116/70 | HR 74 | Temp 97.6°F | Resp 18 | Ht 62.0 in | Wt 142.3 lb

## 2014-05-25 DIAGNOSIS — K279 Peptic ulcer, site unspecified, unspecified as acute or chronic, without hemorrhage or perforation: Secondary | ICD-10-CM

## 2014-05-25 DIAGNOSIS — D509 Iron deficiency anemia, unspecified: Secondary | ICD-10-CM

## 2014-05-25 DIAGNOSIS — K589 Irritable bowel syndrome without diarrhea: Secondary | ICD-10-CM

## 2014-05-25 DIAGNOSIS — K219 Gastro-esophageal reflux disease without esophagitis: Secondary | ICD-10-CM

## 2014-05-25 MED ORDER — DICYCLOMINE HCL 10 MG PO CAPS: 10.0000 mg | ORAL_CAPSULE | Freq: Two times a day (BID) | ORAL | Status: DC

## 2014-05-25 NOTE — Patient Instructions (Signed)
Hemoccult x1. Call if you experience side effects with dicyclomine.

## 2014-05-25 NOTE — Progress Notes (Signed)
Presenting complaint;  Followup for GERD peptic ulcer disease iron deficiency anemia and IBS.  Subjective:  Patient is 43 year old Caucasian female who presents for scheduled visit. She was last seen 6 months ago. She is not feeling well. Her stepson whom she raised along with her husband when he was 43 years old feels himself about 9 weeks ago and he was 43 years old. Her appetite has dropped. She has lost 7 pounds since her last visit. She states she had lost more than 7 pounds but now she has gained a few back. She is been having daily abdominal cramping diarrhea and urgency. She is not taking Levsin as it makes her drowsy. She is having 3 to 4 stools daily. She denies melena or rectal bleeding. She does not have heartburn or chest pain unless she forgets to take her PPI. She developed chest pain recently when she forgot to take this medication. She states when this occurs it may take a few days to get back to her baseline.           Current Medications: Outpatient Encounter Prescriptions as of 05/25/2014  Medication Sig  . clonazePAM (KLONOPIN) 0.5 MG tablet as needed. 1/2 to 1 po BID prn anxiety  . fluticasone (FLONASE) 50 MCG/ACT nasal spray daily. 2 sprays each nostril qd prn congestion  . hyoscyamine (LEVSIN SL) 0.125 MG SL tablet PLACE ONE TABLET UNDER THE TONGUE THREE TIMES DAILY WITH MEALS AS NEEDED FOR  CRAMPING  . loratadine (CLARITIN) 10 MG tablet Take 10 mg by mouth daily.  . Norethindrone Acetate-Ethinyl Estrad-FE (LOESTRIN 24 FE) 1-20 MG-MCG(24) tablet Take 1 tablet by mouth daily.  Marland Kitchen. omeprazole (PRILOSEC) 20 MG capsule TAKE ONE CAPSULE BY MOUTH TWICE DAILY BEFORE A MEAL  . [DISCONTINUED] b complex vitamins tablet Take 1 tablet by mouth.  . [DISCONTINUED] Cholecalciferol (VITAMIN D3) 1000 UNITS CHEW Chew by mouth. Patient states that she takes 2-3 per week.  . [DISCONTINUED] ferrous sulfate 324 (65 FE) MG TBEC Take by mouth. Patient states that she takes once or twice per  month while on her menses.     Objective: Blood pressure 116/70, pulse 74, temperature 97.6 F (36.4 C), temperature source Oral, resp. rate 18, height 5\' 2"  (1.575 m), weight 142 lb 4.8 oz (64.547 kg), last menstrual period 05/01/2014. Patient is alert and in no acute distress. Conjunctiva is pink. Sclera is nonicteric Oropharyngeal mucosa is normal. No neck masses or thyromegaly noted. Cardiac exam with regular rhythm normal S1 and S2. No murmur or gallop noted. Lungs are clear to auscultation. Abdomen is symmetrical. Bowel sounds are normal. Abdomen is soft with mild midepigastric tenderness. No organomegaly or masses noted.  No LE edema or clubbing noted.   Assessment:  #1. Peptic ulcer disease. She was found to have peptic ulcer disease over 5 years ago and ulcers have never healed completely. She also had [facility] and once again biopsy revealed benign appearing ulcers and negative H. pylori stains and no granulomas. She is presently asymptomatic on PPI. #2. GERD. Symptoms well-controlled with therapy. #3. Iron deficiency anemia felt to be secondary to impaired iron absorption due to chronic PPI therapy. Serum ferritin was 3 in December 2013. #4. IBS. Symptoms have flared up possibly do to loss of her stepson and she is intolerant of Levsin.   Plan:  Patient will go to the lab for CBC, serum iron TIBC and ferritin. Discontinue Levsin. Dicyclomine 10 mg by mouth twice a  day(before breakfast and lunch). Hemoccult x1. Office visit in 3 months or earlier if symptoms progress.

## 2014-05-29 LAB — FERRITIN: Ferritin: 1 ng/mL — ABNORMAL LOW (ref 10–291)

## 2014-05-29 LAB — CBC
HCT: 33.5 % — ABNORMAL LOW (ref 36.0–46.0)
HEMOGLOBIN: 10.2 g/dL — AB (ref 12.0–15.0)
MCH: 22.8 pg — ABNORMAL LOW (ref 26.0–34.0)
MCHC: 30.4 g/dL (ref 30.0–36.0)
MCV: 74.8 fL — ABNORMAL LOW (ref 78.0–100.0)
PLATELETS: 350 10*3/uL (ref 150–400)
RBC: 4.48 MIL/uL (ref 3.87–5.11)
RDW: 17.8 % — ABNORMAL HIGH (ref 11.5–15.5)
WBC: 4.9 10*3/uL (ref 4.0–10.5)

## 2014-05-29 LAB — IRON AND TIBC
%SAT: 4 % — ABNORMAL LOW (ref 20–55)
IRON: 20 ug/dL — AB (ref 42–145)
TIBC: 453 ug/dL (ref 250–470)
UIBC: 433 ug/dL — ABNORMAL HIGH (ref 125–400)

## 2014-06-09 ENCOUNTER — Telehealth (INDEPENDENT_AMBULATORY_CARE_PROVIDER_SITE_OTHER): Payer: Self-pay | Admitting: *Deleted

## 2014-06-09 NOTE — Telephone Encounter (Signed)
   Diagnosis: Peptic Ulcer Disease   Result(s)   Card 1: Negative       Completed by: Larose Hiresammy Zaim Nitta, LPN   HEMOCCULT SENSA DEVELOPER: LOT#:  [id number] EXPIRATION DATE: 2016-05   HEMOCCULT SENSA CARD:  LOT#:  [id number] EXPIRATION DATE: 09/15   CARD CONTROL RESULTS:  POSITIVE: Positive NEGATIVE: Negative    ADDITIONAL COMMENTS:  Patient was called and given the result of hemoccult.

## 2014-06-14 NOTE — Telephone Encounter (Signed)
Hemoccult is negative. 

## 2014-06-22 ENCOUNTER — Ambulatory Visit (INDEPENDENT_AMBULATORY_CARE_PROVIDER_SITE_OTHER): Payer: 59 | Admitting: Family Medicine

## 2014-06-22 ENCOUNTER — Encounter: Payer: Self-pay | Admitting: Family Medicine

## 2014-06-22 VITALS — BP 116/76 | Ht 62.0 in | Wt 141.0 lb

## 2014-06-22 DIAGNOSIS — K219 Gastro-esophageal reflux disease without esophagitis: Secondary | ICD-10-CM

## 2014-06-22 DIAGNOSIS — G4700 Insomnia, unspecified: Secondary | ICD-10-CM

## 2014-06-22 MED ORDER — CLONAZEPAM 0.5 MG PO TABS: ORAL_TABLET | ORAL | Status: DC

## 2014-06-22 NOTE — Progress Notes (Signed)
Subjective:    Patient ID: Samantha Wang, female    DOB: [birth year], 43 y.o.   MRN: [id number]  HPI Patient here today to discuss  Clonazepam medication. She has no other concerns. Definitely helps with sleeping also.  Staying busy with everything , kids very busy  Go go   Uses rare dose of anxiety med prn, uses rarely   Hx of ibs, uses meds for that bentyl  Exercise walks twice per d and then regularly   reflux and ibs unde control  Followed by Dr. Dionicia Abler for this Review of Systems No fever no chills no sore throat no chest pain no back pain ROS otherwise negative    Objective:   Physical Exam  Alert no apparent distress. Lungs clear. Heart rare in rhythm. HEENT normal.      Assessment & Plan:  Impression insomnia good control discussed #2 chronic anxiety with recent flare do to suicide of stepson. Discussed #3 reflux stable plan maintain medications. Diet exercise discussed in encourage.

## 2014-06-23 DIAGNOSIS — G4700 Insomnia, unspecified: Secondary | ICD-10-CM | POA: Insufficient documentation

## 2014-06-24 ENCOUNTER — Other Ambulatory Visit: Payer: Self-pay | Admitting: Nurse Practitioner

## 2014-08-21 ENCOUNTER — Other Ambulatory Visit (INDEPENDENT_AMBULATORY_CARE_PROVIDER_SITE_OTHER): Payer: Self-pay | Admitting: Internal Medicine

## 2014-08-23 ENCOUNTER — Encounter: Payer: Self-pay | Admitting: Family Medicine

## 2014-08-30 ENCOUNTER — Ambulatory Visit (INDEPENDENT_AMBULATORY_CARE_PROVIDER_SITE_OTHER): Payer: 59 | Admitting: Internal Medicine

## 2014-08-30 DIAGNOSIS — K219 Gastro-esophageal reflux disease without esophagitis: Secondary | ICD-10-CM

## 2014-08-30 DIAGNOSIS — D509 Iron deficiency anemia, unspecified: Secondary | ICD-10-CM

## 2014-08-30 DIAGNOSIS — K589 Irritable bowel syndrome without diarrhea: Secondary | ICD-10-CM

## 2014-08-30 NOTE — Progress Notes (Signed)
Presenting complaint;  Follow for IBS, peptic ulcer disease, GERD and iron deficiency anemia.  Subjective:  Patient is 43 year old Caucasian female who presents for scheduled visit. She was last seen on 05/25/2014 for diarrhea with urgency and weight loss. She had been under  lot of stress due to timely death of her stepson whom she had raised.her stool was guaiac negative. Lab studies revealed iron deficiency anemia. Patient was begun on dicyclomine. She was also advised to take iron pill daily. She is feeling better but not 100%. She has anywhere from 1-5 bowel movements every morning and she is generally through by 10 AM. While she has not had any accidents she has had urgency and close calls. Stool consistency varies from normal to loose. She denies nocturnal diarrhea. She does not have frequent bowel movements on weekends when she sleeps late. Her appetite is good but she is afraid to eat unless she is going to be home. She generally has small lunch and has no difficulty. She has lost another 4 pounds since her last visit. She does not have heartburn unless she forgets to take PPI. She also denies epigastric pain melena or rectal bleeding.   Current Medications: Outpatient Encounter Prescriptions as of 08/30/2014  Medication Sig  . clonazePAM (KLONOPIN) 0.5 MG tablet 1/2 to 1 po BID prn anxiety  . dicyclomine (BENTYL) 10 MG capsule Take 1 capsule (10 mg total) by mouth 2 (two) times daily before a meal.  . fluticasone (FLONASE) 50 MCG/ACT nasal spray USE TWO SPRAY(S) IN EACH NOSTRIL ONCE DAILY AS NEEDED FOR CONGESTION  . loratadine (CLARITIN) 10 MG tablet Take 10 mg by mouth daily.  . Norethindrone Acetate-Ethinyl Estrad-FE (LOESTRIN 24 FE) 1-20 MG-MCG(24) tablet Take 1 tablet by mouth daily.  Marland Kitchen. omeprazole (PRILOSEC) 20 MG capsule TAKE ONE CAPSULE BY MOUTH TWICE DAILY BEFORE A MEAL     Objective: There were no vitals taken for this visit. Patient is alert and in no acute  distress. Conjunctiva is pink. Sclera is nonicteric Oropharyngeal mucosa is normal. No neck masses or thyromegaly noted. Cardiac exam with regular rhythm normal S1 and S2. No murmur or gallop noted. Lungs are clear to auscultation. Abdomen is symmetrical. Bowel sounds are normal. Abdomen is soft and nontender. No organomegaly or masses.  No LE edema or clubbing noted.  Labs/studies Results: Lab data from 05/29/2014. WBC 4.9, H&H 10.2 and 33.5, MCV 74.8 and platelet count 350K Serum iron was 20, TIBC 453 and saturation 4% Serum ferritin was 1.    Assessment:  #1. IBS.she is doing better with low-dose dicyclomine but symptom control is not 100%. 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 #2. Iron deficiency anemia. No evidence of overt or occult GI bleed. Iron deficiency anemia is felt to be secondary to impaired absorption due to chronic PPI therapy. #3. GERD. Symptoms are well-controlled with therapy. #4. PUD.she has no symptoms. She has never been documented to have completely healed peptic ulcer disease Last EGD with biopsy was in January, 2012 [facility]. Prior to that [facility].  Plan:  Take dicyclomine 10-20 mg on waking up. CBC, serum iron TIBC and ferritin. If she remains with anemia will proceed with diagnostic EGD and colonoscopy otherwise office visit in 6 months.

## 2014-08-30 NOTE — Patient Instructions (Signed)
Can take 20 mg of dicyclomine by mouth before breakfast if 10 mg does not work and you have no side effects. Patient will call with results of blood test.

## 2014-08-31 ENCOUNTER — Encounter (INDEPENDENT_AMBULATORY_CARE_PROVIDER_SITE_OTHER): Payer: Self-pay | Admitting: Internal Medicine

## 2014-08-31 LAB — CBC
HCT: 39.3 % (ref 36.0–46.0)
Hemoglobin: 12.6 g/dL (ref 12.0–15.0)
MCH: 26.1 pg (ref 26.0–34.0)
MCHC: 32.1 g/dL (ref 30.0–36.0)
MCV: 81.4 fL (ref 78.0–100.0)
Platelets: 347 10*3/uL (ref 150–400)
RBC: 4.83 MIL/uL (ref 3.87–5.11)
RDW: 16.6 % — AB (ref 11.5–15.5)
WBC: 6.7 10*3/uL (ref 4.0–10.5)

## 2014-08-31 LAB — IRON AND TIBC
%SAT: 9 % — ABNORMAL LOW (ref 20–55)
IRON: 38 ug/dL — AB (ref 42–145)
TIBC: 406 ug/dL (ref 250–470)
UIBC: 368 ug/dL (ref 125–400)

## 2014-08-31 LAB — FERRITIN: Ferritin: 9 ng/mL — ABNORMAL LOW (ref 10–291)

## 2014-12-10 ENCOUNTER — Other Ambulatory Visit: Payer: Self-pay

## 2014-12-10 DIAGNOSIS — Z1231 Encounter for screening mammogram for malignant neoplasm of breast: Secondary | ICD-10-CM

## 2014-12-13 ENCOUNTER — Ambulatory Visit
Admission: RE | Admit: 2014-12-13 | Discharge: 2014-12-13 | Disposition: A | Discharge status: Home / Self Care | Payer: 59 | Source: Ambulatory Visit

## 2014-12-13 DIAGNOSIS — Z1231 Encounter for screening mammogram for malignant neoplasm of breast: Secondary | ICD-10-CM

## 2014-12-24 ENCOUNTER — Encounter: Payer: Self-pay | Admitting: Family Medicine

## 2014-12-24 ENCOUNTER — Ambulatory Visit (INDEPENDENT_AMBULATORY_CARE_PROVIDER_SITE_OTHER): Payer: 59 | Admitting: Family Medicine

## 2014-12-24 VITALS — BP 108/70 | Temp 98.5°F | Ht 62.0 in | Wt 150.0 lb

## 2014-12-24 DIAGNOSIS — R51 Headache: Secondary | ICD-10-CM | POA: Diagnosis not present

## 2014-12-24 DIAGNOSIS — R519 Headache, unspecified: Secondary | ICD-10-CM

## 2014-12-24 MED ORDER — OXYCODONE HCL 5 MG PO TABS: 5.0000 mg | ORAL_TABLET | Freq: Four times a day (QID) | ORAL | Status: DC | PRN

## 2014-12-24 MED ORDER — CHLORZOXAZONE 500 MG PO TABS: 500.0000 mg | ORAL_TABLET | Freq: Three times a day (TID) | ORAL | Status: DC | PRN
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
## 2015-01-06 ENCOUNTER — Ambulatory Visit (INDEPENDENT_AMBULATORY_CARE_PROVIDER_SITE_OTHER): Payer: 59 | Admitting: Family Medicine

## 2015-01-06 ENCOUNTER — Encounter: Payer: Self-pay | Admitting: Family Medicine

## 2015-01-06 ENCOUNTER — Ambulatory Visit (HOSPITAL_COMMUNITY)
Admission: RE | Admit: 2015-01-06 | Discharge: 2015-01-06 | Disposition: A | Discharge status: Home / Self Care | Payer: 59 | Source: Ambulatory Visit | Attending: Family Medicine | Admitting: Family Medicine

## 2015-01-06 VITALS — Ht 62.0 in

## 2015-01-06 DIAGNOSIS — M542 Cervicalgia: Secondary | ICD-10-CM | POA: Diagnosis present

## 2015-01-06 DIAGNOSIS — G8929 Other chronic pain: Secondary | ICD-10-CM

## 2015-01-06 DIAGNOSIS — R51 Headache: Secondary | ICD-10-CM | POA: Diagnosis not present

## 2015-01-06 MED ORDER — CHLORZOXAZONE 500 MG PO TABS: 500.0000 mg | ORAL_TABLET | Freq: Three times a day (TID) | ORAL | Status: DC | PRN
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
## 2015-01-09 ENCOUNTER — Other Ambulatory Visit: Payer: Self-pay | Admitting: Certified Nurse Midwife

## 2015-01-10 NOTE — Telephone Encounter (Signed)
Medication refill request: [place] Last AEX:  01/11/14 DL Next AEX: 1/61/093/29/16 DL Last MMG (if hormonal medication request): 12/13/14 BIRADS1:Neg Refill authorized: 01/11/14 #1pack/12R Today #1/0R?

## 2015-01-18 ENCOUNTER — Ambulatory Visit (INDEPENDENT_AMBULATORY_CARE_PROVIDER_SITE_OTHER): Payer: 59 | Admitting: Certified Nurse Midwife

## 2015-01-18 ENCOUNTER — Encounter: Payer: Self-pay | Admitting: Certified Nurse Midwife

## 2015-01-18 VITALS — BP 110/70 | HR 72 | Resp 16 | Ht 62.75 in | Wt 149.0 lb

## 2015-01-18 DIAGNOSIS — Z3041 Encounter for surveillance of contraceptive pills: Secondary | ICD-10-CM

## 2015-01-18 DIAGNOSIS — Z01419 Encounter for gynecological examination (general) (routine) without abnormal findings: Secondary | ICD-10-CM

## 2015-01-18 MED ORDER — NORETHIN ACE-ETH ESTRAD-FE 1-20 MG-MCG(24) PO TABS: 1.0000 | ORAL_TABLET | Freq: Every day | ORAL | Status: DC

## 2015-01-18 NOTE — Progress Notes (Signed)
44 y.o. G46P2002 Married  Caucasian Fe here for annual exam. Periods normal, lighter, minimal cramping. Menstrual headaches and PMS miuch better with OCP use. Continues with GI regarding IBS and anemia. Sees PCP for aex/labs and medication management. No other health concerns today. Planning a trip to the beach!  Patient's last menstrual period was 01/10/2015.          Sexually active: Yes.    The current method of family planning is vasectomy and OCP (estrogen/progesterone).    Exercising: Yes.    walking Smoker:  no  Health Maintenance: Pap: 01-11-14 neg MMG:  12-13-14 category c density,birads 1:neg Colonoscopy:  none BMD:   none TDaP:  2011 Labs: none Self breast exam: done occ   reports that she has never smoked. She has never used smokeless tobacco. She reports that she does not drink alcohol or use illicit drugs.  Past Medical History  Diagnosis Date  . Anemia   . Peptic ulcer disease   . Breast nodule     left   . H/O cesarean section     history of 2 c-sections    . Reflux   . Allergy   . Anxiety     Past Surgical History  Procedure Laterality Date  . Upper gastrointestinal endoscopy    . Cesarean section  [id number]    Current Outpatient Prescriptions  Medication Sig Dispense Refill  . chlorzoxazone (PARAFON) 500 MG tablet Take 1 tablet (500 mg total) by mouth 3 (three) times daily as needed for muscle spasms. 30 tablet 0  . clonazePAM (KLONOPIN) 0.5 MG tablet 1/2 to 1 po BID prn anxiety 30 tablet 5  . dicyclomine (BENTYL) 10 MG capsule Take 1 capsule (10 mg total) by mouth 2 (two) times daily before a meal. 60 capsule 5  . fluticasone (FLONASE) 50 MCG/ACT nasal spray USE TWO SPRAY(S) IN EACH NOSTRIL ONCE DAILY AS NEEDED FOR CONGESTION 16 g 5  . loratadine (CLARITIN) 10 MG tablet Take 10 mg by mouth daily.    . Norethindrone Acetate-Ethinyl Estrad-FE (LOMEDIA 24 FE) 1-20 MG-MCG(24) tablet Take 1 tablet by mouth daily. 1 Package 12  . omeprazole (PRILOSEC) 20 MG  capsule TAKE ONE CAPSULE BY MOUTH TWICE DAILY BEFORE A MEAL 60 capsule 11  . oxyCODONE (OXY IR/ROXICODONE) 5 MG immediate release tablet Take 1 tablet (5 mg total) by mouth every 6 (six) hours as needed for severe pain. 24 tablet 0   No current facility-administered medications for this visit.    Family History  Problem Relation Age of Onset  . Healthy Mother   . Hyperlipidemia Father   . Healthy Brother   . Healthy Son   . Healthy Daughter     ROS:  Pertinent items are noted in HPI.  Otherwise, a comprehensive ROS was negative.  Exam:   BP 110/70 mmHg  Pulse 72  Resp 16  Ht 5' 2.75" (1.594 m)  Wt 149 lb (67.586 kg)  BMI 26.60 kg/m2  LMP 01/10/2015 Height: 5' 2.75" (159.4 cm) Ht Readings from Last 3 Encounters:  01/18/15 5' 2.75" (1.594 m)  01/06/15  (1.575 m)  12/24/14  (1.575 m)    General appearance: alert, cooperative and appears stated age Head: Normocephalic, without obvious abnormality, atraumatic Neck: no adenopathy, supple, symmetrical, trachea midline and thyroid normal to inspection and palpation Lungs: clear to auscultation bilaterally Breasts: normal appearance, no masses or tenderness, No nipple retraction or dimpling, No nipple discharge or bleeding, No axillary or supraclavicular adenopathy Heart:  regular rate and rhythm Abdomen: soft, non-tender; no masses,  no organomegaly Extremities: extremities normal, atraumatic, no cyanosis or edema Skin: Skin color, texture, turgor normal. No rashes or lesions Lymph nodes: Cervical, supraclavicular, and axillary nodes normal. No abnormal inguinal nodes palpated Neurologic: Grossly normal   Pelvic: External genitalia:  no lesions              Urethra:  normal appearing urethra with no masses, tenderness or lesions              Bartholin's and Skene's: normal                 Vagina: normal appearing vagina with normal color and discharge, no lesions              Cervix: normal, non tender, non lesions               Pap taken: No. Bimanual Exam:  Uterus:  normal size, contour, position, consistency, mobility, non-tender              Adnexa: normal adnexa and no mass, fullness, tenderness               Rectovaginal: Confirms               Anus:  normal sphincter tone, no lesions   A:  Well Woman with normal exam  Menstrual Migraine and PMS improved with OCP use  IBS management with GI  Chronic anemia with PCP management  P:   Reviewed health and wellness pertinent to exam  Discussed risks and benefits of continuing OCP and patient would like to continue.  Rx Loestrin 24 FE see order  Continue follow up with MD as indicated.  Pap smear not taken today   counseled on breast self exam, mammography screening, adequate intake of calcium and vitamin D, diet and exercise  return annually or prn  An After Visit Summary was printed and given to the patient.

## 2015-01-18 NOTE — Patient Instructions (Signed)

## 2015-01-19 NOTE — Progress Notes (Signed)
Reviewed personally.  M. Suzanne Anastazia Creek, MD.  

## 2015-01-27 ENCOUNTER — Telehealth: Payer: Self-pay | Admitting: Family Medicine

## 2015-01-27 MED ORDER — OSELTAMIVIR PHOSPHATE 75 MG PO CAPS: 75.0000 mg | ORAL_CAPSULE | Freq: Two times a day (BID) | ORAL | Status: DC

## 2015-01-27 NOTE — Telephone Encounter (Signed)
Spouse [facility])  diagnosed yesterday here by Dr Brett CanalesSteve with the Flu Was told if anyone else comes down with any signs of it to  Call in to get a script  They are not at this point showing signs but going out of town  An want to know if you can go ahead a call in a script for them   [facility]

## 2015-01-27 NOTE — Telephone Encounter (Signed)
Consult with Dr Lorin PicketScott: Tamiflu 75mg  one BID for 5 days-Rx printed-do not fill rx unless flu like sx and seek medical care if difficulties. Rx printed and up front for pick up-Verbalized understanding.

## 2015-01-31 ENCOUNTER — Ambulatory Visit: Payer: 59 | Admitting: Family Medicine

## 2015-02-03 ENCOUNTER — Encounter: Payer: Self-pay | Admitting: Nurse Practitioner

## 2015-02-03 ENCOUNTER — Ambulatory Visit (INDEPENDENT_AMBULATORY_CARE_PROVIDER_SITE_OTHER): Payer: 59 | Admitting: Nurse Practitioner

## 2015-02-03 VITALS — BP 102/72 | Temp 97.8°F | Ht 62.0 in | Wt 151.5 lb

## 2015-02-03 DIAGNOSIS — J0110 Acute frontal sinusitis, unspecified: Secondary | ICD-10-CM | POA: Diagnosis not present

## 2015-02-03 MED ORDER — CEFDINIR 300 MG PO CAPS: 300.0000 mg | ORAL_CAPSULE | Freq: Two times a day (BID) | ORAL | Status: DC

## 2015-02-05 ENCOUNTER — Encounter: Payer: Self-pay | Admitting: Nurse Practitioner

## 2015-02-05 NOTE — Progress Notes (Signed)
Subjective:  Presents for c/o congestion x 1 week. No fever. Frontal area headache. Congested cough. Runny nose. Ear pressure. Sore throat. No wheezing.   Objective:   BP 102/72 mmHg  Temp(Src) 97.8 F (36.6 C) (Oral)  Ht 5\' 2"  (1.575 m)  Wt 151 lb 8 oz (68.72 kg)  BMI 27.70 kg/m2  LMP 01/10/2015 NAD. Alert, oriented. TMs retracted, no erythema. Pharynx injected with PND noted. Neck supple with mild anterior adenopathy. Lungs clear. Heart RRR.   Assessment: Acute frontal sinusitis, recurrence not specified  Plan:  Meds ordered this encounter  Medications  . cefdinir (OMNICEF) 300 MG capsule    Sig: Take 1 capsule (300 mg total) by mouth 2 (two) times daily.    Dispense:  20 capsule    Refill:  0    Order Specific Question:  Supervising Provider    Answer:  Merlyn AlbertLUKING, WILLIAM S [[id number]   OTC meds as directed for congestion. Call back if worsens or persists.

## 2015-03-01 ENCOUNTER — Encounter (INDEPENDENT_AMBULATORY_CARE_PROVIDER_SITE_OTHER): Payer: Self-pay | Admitting: Internal Medicine

## 2015-03-01 ENCOUNTER — Ambulatory Visit (INDEPENDENT_AMBULATORY_CARE_PROVIDER_SITE_OTHER): Payer: 59 | Admitting: Internal Medicine

## 2015-03-01 VITALS — BP 112/74 | HR 72 | Temp 98.4°F | Resp 18 | Ht 62.0 in | Wt 150.3 lb

## 2015-03-01 DIAGNOSIS — K219 Gastro-esophageal reflux disease without esophagitis: Secondary | ICD-10-CM

## 2015-03-01 DIAGNOSIS — D509 Iron deficiency anemia, unspecified: Secondary | ICD-10-CM | POA: Diagnosis not present

## 2015-03-01 DIAGNOSIS — Z8639 Personal history of other endocrine, nutritional and metabolic disease: Secondary | ICD-10-CM | POA: Diagnosis not present

## 2015-03-01 DIAGNOSIS — K279 Peptic ulcer, site unspecified, unspecified as acute or chronic, without hemorrhage or perforation: Secondary | ICD-10-CM | POA: Diagnosis not present

## 2015-03-01 MED ORDER — VITAMIN D 50 MCG (2000 UT) PO TABS: 2000.0000 [IU] | ORAL_TABLET | ORAL | Status: DC

## 2015-03-01 NOTE — Progress Notes (Addendum)
Presenting complaint;  Follow-up for GERD peptic ulcer disease IBS and not an deficiency anemia.  Subjective:  Samantha Wang is 44 year old Caucasian female who is here for scheduled visit. She was last seen on 08/30/2014. She is feeling better. On most days she takes single dose of dicyclomine. On most days she has one to 2 stools but on worst days she may have as many as 5 per day. She has not had any accidents and has much less urgency. She also has not had any accidents. She denies melena or rectal bleeding. She is not having any side effects with dicyclomine. She is taking 35-40 doses of omeprazole per month. Every now and then she takes dose in the evening. However if she misses 2 doses she develops chest and abdominal pain. Her appetite is good. She walks 2-3 times a week. She states stress level is degrees and she is not taking clonazepam on daily basis.   Current Medications: Outpatient Encounter Prescriptions as of 03/01/2015  Medication Sig  . clonazePAM (KLONOPIN) 0.5 MG tablet 1/2 to 1 po BID prn anxiety  . dicyclomine (BENTYL) 10 MG capsule Take 1 capsule (10 mg total) by mouth 2 (two) times daily before a meal.  . Ferrous Sulfate (SLOW FE PO) Take by mouth. Patient's states that she is taking twice a week.  . fluticasone (FLONASE) 50 MCG/ACT nasal spray USE TWO SPRAY(S) IN EACH NOSTRIL ONCE DAILY AS NEEDED FOR CONGESTION  . loratadine (CLARITIN) 10 MG tablet Take 10 mg by mouth daily.  . Norethindrone Acetate-Ethinyl Estrad-FE (LOMEDIA 24 FE) 1-20 MG-MCG(24) tablet Take 1 tablet by mouth daily.  Marland Kitchen. omeprazole (PRILOSEC) 20 MG capsule TAKE ONE CAPSULE BY MOUTH TWICE DAILY BEFORE A MEAL  . [DISCONTINUED] cefdinir (OMNICEF) 300 MG capsule Take 1 capsule (300 mg total) by mouth 2 (two) times daily. (Patient not taking: Reported on 03/01/2015)  . [DISCONTINUED] chlorzoxazone (PARAFON) 500 MG tablet Take 1 tablet (500 mg total) by mouth 3 (three) times daily as needed for muscle spasms.  (Patient not taking: Reported on 03/01/2015)  . [DISCONTINUED] oseltamivir (TAMIFLU) 75 MG capsule Take 1 capsule (75 mg total) by mouth 2 (two) times daily. (Patient not taking: Reported on 02/03/2015)  . [DISCONTINUED] oxyCODONE (OXY IR/ROXICODONE) 5 MG immediate release tablet Take 1 tablet (5 mg total) by mouth every 6 (six) hours as needed for severe pain. (Patient not taking: Reported on 03/01/2015)   No facility-administered encounter medications on file as of 03/01/2015.     Objective: Blood pressure 112/74, pulse 72, temperature 98.4 F (36.9 C), temperature source Oral, resp. rate 18, height 5\' 2"  (1.575 m), weight 150 lb 4.8 oz (68.176 kg), last menstrual period 02/06/2015. Patient is alert and in no acute distress. Conjunctiva is pink. Sclera is nonicteric Oropharyngeal mucosa is normal. No neck masses or thyromegaly noted. Cardiac exam with regular rhythm normal S1 and S2. No murmur or gallop noted. Lungs are clear to auscultation. Abdomen symmetrical and soft without tenderness organomegaly or masses.  No LE edema or clubbing noted.  Labs/studies Results: Lab data from 08/30/2014  WBC 6.7, H&H 12.6 and 39.3 and platelet count 347K  Serum iron 38, TIBC 406, saturation 9%   serum ferritin 9.  Assessment:  #1. Peptic ulcer disease. She has about 5 year history of peptic ulcer disease. All of her ulcers have not healed completely. On her last EGD of January 2012 she only had one antral ulcer and biopsy was negative. She also has undergone endoscopic ultrasound and there  was no evidence of neoplasm. Biopsy and serologies have been negative for H. pylori. She is now asymptomatic on PPI. #2. GERD. Symptoms well controlled with therapy. #3. Iron deficiency anemia. Felt to be secondary to impaired iron absorption due to chronic PPI therapy. Hemoccult was negative last year. #4. Irritable bowel syndrome. Symptoms started when she lost her step son. She is doing better now and using  lower dose of dicyclomine. #5. History of vitamin D deficiency. She was documented to respond to supplement. She is not taking any supplement at the present time.  Plan:  Continue omeprazole 20 mg once or twice daily. Patient will go to lab for CBC, serum iron TIBC and ferritin. Patient advised to take vitamin D 2000 units once a week. Bone density study since she has been on PPI for several years. Office visit in 6 months.

## 2015-03-01 NOTE — Patient Instructions (Signed)
Physician will call with results of blood tests when completed. 

## 2015-03-02 LAB — CBC
HEMATOCRIT: 39.2 % (ref 36.0–46.0)
Hemoglobin: 12.8 g/dL (ref 12.0–15.0)
MCH: 28.3 pg (ref 26.0–34.0)
MCHC: 32.7 g/dL (ref 30.0–36.0)
MCV: 86.5 fL (ref 78.0–100.0)
MPV: 11.2 fL (ref 8.6–12.4)
Platelets: 297 10*3/uL (ref 150–400)
RBC: 4.53 MIL/uL (ref 3.87–5.11)
RDW: 14.2 % (ref 11.5–15.5)
WBC: 5.6 10*3/uL (ref 4.0–10.5)

## 2015-03-02 LAB — IRON AND TIBC
%SAT: 20 % (ref 20–55)
IRON: 83 ug/dL (ref 42–145)
TIBC: 409 ug/dL (ref 250–470)
UIBC: 326 ug/dL (ref 125–400)

## 2015-03-02 LAB — FERRITIN: Ferritin: 7 ng/mL — ABNORMAL LOW (ref 10–291)

## 2015-03-03 ENCOUNTER — Telehealth (INDEPENDENT_AMBULATORY_CARE_PROVIDER_SITE_OTHER): Payer: Self-pay | Admitting: *Deleted

## 2015-03-03 DIAGNOSIS — K279 Peptic ulcer, site unspecified, unspecified as acute or chronic, without hemorrhage or perforation: Secondary | ICD-10-CM

## 2015-03-03 DIAGNOSIS — D509 Iron deficiency anemia, unspecified: Secondary | ICD-10-CM

## 2015-03-03 DIAGNOSIS — R790 Abnormal level of blood mineral: Secondary | ICD-10-CM

## 2015-03-03 NOTE — Telephone Encounter (Signed)
Per Dr.Rehman the patient will need to have labs drawn in 3 months 

## 2015-03-03 NOTE — Telephone Encounter (Signed)
Per Dr.Rehman the patient will need to have a Bone Density Study.

## 2015-03-04 ENCOUNTER — Other Ambulatory Visit (INDEPENDENT_AMBULATORY_CARE_PROVIDER_SITE_OTHER): Payer: Self-pay | Admitting: Internal Medicine

## 2015-03-04 DIAGNOSIS — Z79899 Other long term (current) drug therapy: Secondary | ICD-10-CM

## 2015-03-04 DIAGNOSIS — Z8639 Personal history of other endocrine, nutritional and metabolic disease: Secondary | ICD-10-CM

## 2015-03-04 NOTE — Telephone Encounter (Signed)
Bone density sch'd 03/09/15 at 10 ([phone]), left detailed message for patient

## 2015-03-09 ENCOUNTER — Telehealth: Payer: Self-pay | Admitting: Certified Nurse Midwife

## 2015-03-09 ENCOUNTER — Other Ambulatory Visit (HOSPITAL_COMMUNITY): Payer: 59

## 2015-03-09 NOTE — Telephone Encounter (Signed)
Spoke with patient. She is at work today. States that yesterday she missed yesterday work due to headaches and has concerns. She states that some months she does not experience a headache and some months she does, states "there's just no rhyme or reason to when or why they happen."  LMP 02/24/15 and started to have headache and missed work yesterday, today she states symptoms are "mild" but symptoms usually last for 3 days when they do occur. She states that when headaches occur, she experiences visual changes, nausea and vomiting. Today, denies those symptoms. Uses Tylenol for pain when headaches occur and states this does not help pain.   Patient taking Lomedia 24 FE. No missed pills.   Patient has PCP and was unsure if she should discuss with PCP as headaches come with cycle. Declined appointment at this time to discuss. Would like to know from Verner Choleborah S. Leonard CNM recommendations at this time.  Advised if experiences a severe headache, "worst headache of your life" to call 911 or go to ER as it could be emergency. Patient denies this at this time. Adivsed would return call with advice from provider, patient agreeable.

## 2015-03-09 NOTE — Telephone Encounter (Signed)
I think she should be seen neurology for evaluation of headache. Because she is having after she is off active pills.

## 2015-03-09 NOTE — Telephone Encounter (Signed)
Patient says the birth control medication is not helping her headaches. They are stronger.

## 2015-03-10 MED ORDER — NORETHIN-ETH ESTRAD-FE BIPHAS 1 MG-10 MCG / 10 MCG PO TABS: 1.0000 | ORAL_TABLET | Freq: Every day | ORAL | Status: DC

## 2015-03-10 NOTE — Telephone Encounter (Signed)
Patient has started new pack of active pills on Monday. Can you advise on how to [facility]? Patient is agreeable to change and advised I would call back with instructions on when to start new pills.  Confirmed with [facility] that new RX should be brand only, dispensed as written and savings card applied,  Spoke with Brett CanalesSteve.

## 2015-03-10 NOTE — Telephone Encounter (Signed)
Before neurology referral, would recommend Loloestrin as estrogen dosage is lower and there are only two placebo days.  Ok to prescribe for pt.  If continues with headache, consider neurology referral.

## 2015-03-11 ENCOUNTER — Ambulatory Visit (HOSPITAL_COMMUNITY)
Admission: RE | Admit: 2015-03-11 | Discharge: 2015-03-11 | Disposition: A | Discharge status: Home / Self Care | Payer: 59 | Source: Ambulatory Visit | Attending: Internal Medicine | Admitting: Internal Medicine

## 2015-03-11 DIAGNOSIS — E559 Vitamin D deficiency, unspecified: Secondary | ICD-10-CM | POA: Diagnosis present

## 2015-03-11 DIAGNOSIS — Z79899 Other long term (current) drug therapy: Secondary | ICD-10-CM | POA: Diagnosis not present

## 2015-03-11 DIAGNOSIS — Z8639 Personal history of other endocrine, nutritional and metabolic disease: Secondary | ICD-10-CM

## 2015-03-11 NOTE — Telephone Encounter (Signed)
Message left to return call to [facility] at [phone] at home and work number.

## 2015-03-11 NOTE — Telephone Encounter (Signed)
She can just get the [facility] and stop wherever she is the current pack and start the new one.  If she wants to keep her same "start day", for exam maybe it is Monday, just wait until Monday to start the next pack.

## 2015-03-14 NOTE — Telephone Encounter (Signed)
Patient returned call and message from Dr. Hyacinth MeekerMiller given. Patient agreeable. She is advised to call back with continuing headaches, increasing symptoms or other concerns. Patient states she has been explained the risk of birth control pills and signs and symptoms of emergencies and understands headache emergency signs and symptoms as well. Routing to provider for final review. Patient agreeable to disposition. Will close encounter.

## 2015-04-14 ENCOUNTER — Ambulatory Visit (INDEPENDENT_AMBULATORY_CARE_PROVIDER_SITE_OTHER): Payer: 59 | Admitting: Family Medicine

## 2015-04-14 ENCOUNTER — Encounter: Payer: Self-pay | Admitting: Family Medicine

## 2015-04-14 VITALS — BP 140/88 | Ht 62.0 in | Wt 147.0 lb

## 2015-04-14 DIAGNOSIS — K589 Irritable bowel syndrome without diarrhea: Secondary | ICD-10-CM

## 2015-04-14 DIAGNOSIS — G4700 Insomnia, unspecified: Secondary | ICD-10-CM | POA: Diagnosis not present

## 2015-04-14 DIAGNOSIS — R5383 Other fatigue: Secondary | ICD-10-CM | POA: Diagnosis not present

## 2015-04-14 DIAGNOSIS — F32A Depression, unspecified: Secondary | ICD-10-CM

## 2015-04-14 DIAGNOSIS — F329 Major depressive disorder, single episode, unspecified: Secondary | ICD-10-CM

## 2015-04-14 MED ORDER — CLONAZEPAM 0.5 MG PO TABS: ORAL_TABLET | ORAL | Status: DC

## 2015-04-14 MED ORDER — ESCITALOPRAM OXALATE 10 MG PO TABS: 10.0000 mg | ORAL_TABLET | Freq: Every day | ORAL | Status: DC

## 2015-04-14 NOTE — Progress Notes (Signed)
Subjective:    Patient ID: Samantha Wang, female    DOB: [birth year], 44 y.o.   MRN: [id number]  Anxiety Presents for follow-up visit. Onset was 1 to 5 years ago. The problem has been unchanged. The severity of symptoms is interfering with daily activities. Nothing aggravates the symptoms. The quality of sleep is good. Nighttime awakenings: none.   There are no known risk factors. Treatments tried: Clonazepam. The treatment provided moderate relief. Compliance with prior treatments has been good.   Patient states that she as no other concerns at this time.   Anxiety and mood is worse. Patient is noting trouble sleeping. Also notes diminished energy.  Very stressed out because she may lose her job. Also very stressed out because her natural son is leaving for college.  Also had an enormous stress last year with her stepson, who she had been caring for since he was 44 years old, committed suicide. This is created challenges for family.  No true panic attacks.  Diminished energy.  Worked for [employer] and [employer]  Son leaving for college   [employer]  majoing in not sure  Mo and bdo havign issues and leads to stress  Crying spells off and on  Has tried one antidepressives in the past but only for a short period of time Celexa. Was prescribed Lexapro but she never took it Review of Systems Some headaches diminished energy diminished interest in daytime activities. Reflux somewhat increased    Objective:   Physical Exam   Alert vitals stable HEENT normal. Lungs clear. Heart regular rate and rhythm. Ankles without edema. Neuro exam intact. Patient's affect appropriate. Tearful at times. No hallucinations no delusions     Assessment & Plan:  Impression D pression discussed. At great length. Mostly due to exogenous issues. However does have family history of this. Along with personal history. Plan long discussion regarding medications. Exercise strongly encouraged. Initiate  Lexapro rationale discussed. Patient to work through workplace for potential counseling. Recheck as scheduled. Patient also notes her IBS is flared up considerably with all this, discussed at length. WSL

## 2015-04-18 DIAGNOSIS — F329 Major depressive disorder, single episode, unspecified: Secondary | ICD-10-CM | POA: Insufficient documentation

## 2015-04-18 DIAGNOSIS — F32A Depression, unspecified: Secondary | ICD-10-CM | POA: Insufficient documentation

## 2015-04-18 DIAGNOSIS — R5383 Other fatigue: Secondary | ICD-10-CM

## 2015-04-26 ENCOUNTER — Telehealth: Payer: Self-pay | Admitting: Family Medicine

## 2015-04-26 NOTE — Telephone Encounter (Signed)
Call pt, sorry 7 % of folks who take lexapro experience headaches  Next rec med if pt is willing would be wellbutrin 150 sr one q am for three d then one bid, this is chemically not at all related to celexa and now lexapro (two meds pt has had a prob with) if willing 60 five ref

## 2015-04-26 NOTE — Telephone Encounter (Signed)
Pt put on Lexipro, has taken it x3 days, about 2 hrs after taking this med  She gets a headache, pretty much all day.   Please advise, pt not sure if this will go away or does she need to stop taking the med  [facility]

## 2015-04-26 NOTE — Telephone Encounter (Signed)
About 7 out of 100 folks who take this med develop headaches, if occurs

## 2015-04-27 MED ORDER — BUPROPION HCL ER (SR) 150 MG PO TB12: ORAL_TABLET | ORAL | Status: DC

## 2015-04-27 NOTE — Telephone Encounter (Signed)
Discussed with patient. Patient is willing to try the wellbutrin. Rx sent electronically to pharmacy. Patient notified.

## 2015-04-27 NOTE — Telephone Encounter (Signed)
Done

## 2015-04-28 NOTE — Telephone Encounter (Signed)
Nurse call pt , stop lexapro,  Give it a couple weeks, then if pt willing start completely unrelated antidepr wellbutrin sr 150 one q am for three d, then one bid

## 2015-04-28 NOTE — Telephone Encounter (Addendum)
Discussed with patient 04/27/15- Patient agreed to try Wellbutrin and med was sent electronically to pharmacy 04/27/15.

## 2015-04-28 NOTE — Telephone Encounter (Signed)
SEE OTHER MESSAGE AND PLZ INFORM PT

## 2015-04-28 NOTE — Telephone Encounter (Signed)
Dr Brett CanalesSteve  There is an incomplete note on this patient, I can't close it until you finish this please  Thanks

## 2015-04-28 NOTE — Telephone Encounter (Signed)
Nurses part was completed yest and signed yesterday am. It states your first response was an incomplete note  Margaretha SheffieldAutumn S Brown, RN at 04/27/2015 8:46 AM     Status: Signed       Expand All Collapse All   Discussed with patient. Patient is willing to try the wellbutrin. Rx sent electronically to pharmacy. Patient notified.

## 2015-05-04 ENCOUNTER — Other Ambulatory Visit: Payer: Self-pay | Admitting: Family Medicine

## 2015-05-09 DIAGNOSIS — Z029 Encounter for administrative examinations, unspecified: Secondary | ICD-10-CM

## 2015-05-12 ENCOUNTER — Telehealth: Payer: Self-pay | Admitting: Certified Nurse Midwife

## 2015-05-12 NOTE — Telephone Encounter (Signed)
If she feels there is a relationship with her OCP she should stop the pills and continue to evaluate BP. If continues with elevation should follow up with PCP. If she is in middle of pack may have withdrawal bleeding. Has she been taking any sinus medication this will also elevate BP.

## 2015-05-12 NOTE — Telephone Encounter (Signed)
Pt states recently changed her birth control pills and has slightly elevated blood pressure and would like to discuss if this is a side effect of the birth control. Pt uses Lo Loestrin.

## 2015-05-12 NOTE — Telephone Encounter (Signed)
Left message to call Kaitlyn at [phone]. 

## 2015-05-12 NOTE — Telephone Encounter (Signed)
Spoke with patient. Patient states that she started on Lo Loestrin OCP for "menstraul" migraines. Was seen with PCP at the beginning of July and BP was 123/81. Usually runs 108/71. Patient has been monitoring BP since that appointment. Took BP this morning because she felt her heart was racing. BP was 130/89. Patient is concerned with BP continuing to rise. Denies any shortness of breath or dizziness. Patient did start Wellbutrin two weeks ago but states BP was rising before that. States that her migraines have not decreased with taking the Lo Loestrin. Would like to know if she should come off OCP at this time. Advised will speak with Verner Chol CNM and return call with further recommendations. Patient is agreeable.

## 2015-05-12 NOTE — Telephone Encounter (Signed)
Spoke with patient. Advised of message as seen below from Verner Chol CNM. Patient is agreeable and verbalizes understanding. Will stop OCP at this time and monitor her BP. If it remains elevated will seek care with PCP.  Routing to provider for final review. Patient agreeable to disposition. Will close encounter.   Patient aware provider will review message and nurse will return call if any additional advice or change of disposition.

## 2015-05-18 ENCOUNTER — Encounter (INDEPENDENT_AMBULATORY_CARE_PROVIDER_SITE_OTHER): Payer: Self-pay | Admitting: *Deleted

## 2015-05-18 ENCOUNTER — Other Ambulatory Visit (INDEPENDENT_AMBULATORY_CARE_PROVIDER_SITE_OTHER): Payer: Self-pay | Admitting: *Deleted

## 2015-05-18 DIAGNOSIS — K279 Peptic ulcer, site unspecified, unspecified as acute or chronic, without hemorrhage or perforation: Secondary | ICD-10-CM

## 2015-05-18 DIAGNOSIS — R790 Abnormal level of blood mineral: Secondary | ICD-10-CM

## 2015-05-18 DIAGNOSIS — D509 Iron deficiency anemia, unspecified: Secondary | ICD-10-CM

## 2015-05-24 ENCOUNTER — Ambulatory Visit: Payer: 59 | Admitting: Family Medicine

## 2015-05-25 ENCOUNTER — Encounter: Payer: Self-pay | Admitting: Family Medicine

## 2015-05-25 ENCOUNTER — Ambulatory Visit (INDEPENDENT_AMBULATORY_CARE_PROVIDER_SITE_OTHER): Payer: 59 | Admitting: Family Medicine

## 2015-05-25 VITALS — BP 134/88 | Ht 62.0 in | Wt 139.0 lb

## 2015-05-25 DIAGNOSIS — R030 Elevated blood-pressure reading, without diagnosis of hypertension: Secondary | ICD-10-CM

## 2015-05-25 DIAGNOSIS — F329 Major depressive disorder, single episode, unspecified: Secondary | ICD-10-CM | POA: Diagnosis not present

## 2015-05-25 DIAGNOSIS — R5383 Other fatigue: Secondary | ICD-10-CM | POA: Diagnosis not present

## 2015-05-25 DIAGNOSIS — K219 Gastro-esophageal reflux disease without esophagitis: Secondary | ICD-10-CM | POA: Diagnosis not present

## 2015-05-25 DIAGNOSIS — IMO0001 Reserved for inherently not codable concepts without codable children: Secondary | ICD-10-CM | POA: Insufficient documentation

## 2015-05-25 DIAGNOSIS — F32A Depression, unspecified: Secondary | ICD-10-CM

## 2015-05-25 NOTE — Progress Notes (Signed)
Subjective:    Patient ID: Samantha Wang, female    DOB: [birth year], 44 y.o.   MRN: [id number]  HPIFollow up on starting wellbutrin. Has been on the full dose Wellbutrin only for the past 2 weeks. No obvious side effects.  Had a lot of difficulty unfortunately with Lexapro. Had to stop it. Next  Tremendous stress currently. Work place is in trouble. At risk of losing job. A lot of stress going on.  Stress because sons heading off to college in [place].  Reflux generally stable on meds.  Blood pressures been elevated lately. Started before taking birth control pills. No close family history of high blood pressure. Trying to exercise watch salt intake.  Still experiencing depression and feeling down and moodiness.  Pt states no concerns today.   Of note exercising regularly and watching diet more closely  Review of Systems No headache no chest pain no back pain no abdominal pain no change in bowel habits no blood in stool ROS otherwise negative    Objective:   Physical Exam  Alert vitals stable blood pressure 132/90 on repeat HEENT normal. Lungs clear. Heart regular in rhythm.      Assessment & Plan:  Impression elevated blood pressure discussed #2 depression not worse with not much improvement. Only 2 weeks on therapeutic dose Wellbutrin No. 3 reflux stable plan diet discussed exercise discussed cut down salt recheck in several months maintain same dose of medication. 25 minutes spent most in discussion of these chief concerns WSL

## 2015-05-26 LAB — FERRITIN: Ferritin: 10 ng/mL (ref 10–291)

## 2015-05-26 LAB — HEMOGLOBIN AND HEMATOCRIT, BLOOD
HCT: 43.4 % (ref 36.0–46.0)
HEMOGLOBIN: 14.3 g/dL (ref 12.0–15.0)

## 2015-05-27 ENCOUNTER — Telehealth: Payer: Self-pay | Admitting: Family Medicine

## 2015-05-27 LAB — IRON AND TIBC
%SAT: 40 % (ref 20–55)
Iron: 153 ug/dL — ABNORMAL HIGH (ref 42–145)
TIBC: 381 ug/dL (ref 250–470)
UIBC: 228 ug/dL (ref 125–400)

## 2015-05-27 LAB — FOLATE: Folate: 8 ng/mL

## 2015-05-27 MED ORDER — BUPROPION HCL ER (SR) 150 MG PO TB12: 150.0000 mg | ORAL_TABLET | Freq: Two times a day (BID) | ORAL | Status: DC

## 2015-05-27 NOTE — Telephone Encounter (Signed)
Left message notifying patient that medication has been sent to pharmacy.

## 2015-05-27 NOTE — Telephone Encounter (Signed)
Patient says that she was seen here on 05/25/2015 and was to have buPROPion (WELLBUTRIN SR) 150 MG 12 hr tablet called in.  Pharmacy reports they dont have.  Can we please send?  [facility]

## 2015-06-24 NOTE — Telephone Encounter (Signed)
pt

## 2015-07-06 ENCOUNTER — Other Ambulatory Visit: Payer: Self-pay | Admitting: Family Medicine

## 2015-08-07 ENCOUNTER — Other Ambulatory Visit: Payer: Self-pay | Admitting: Family Medicine

## 2015-08-25 ENCOUNTER — Ambulatory Visit (INDEPENDENT_AMBULATORY_CARE_PROVIDER_SITE_OTHER): Payer: 59 | Admitting: Family Medicine

## 2015-08-25 ENCOUNTER — Encounter: Payer: Self-pay | Admitting: Family Medicine

## 2015-08-25 VITALS — BP 120/70 | Ht 62.0 in | Wt 140.4 lb

## 2015-08-25 DIAGNOSIS — R030 Elevated blood-pressure reading, without diagnosis of hypertension: Secondary | ICD-10-CM

## 2015-08-25 DIAGNOSIS — IMO0001 Reserved for inherently not codable concepts without codable children: Secondary | ICD-10-CM

## 2015-08-25 DIAGNOSIS — G4700 Insomnia, unspecified: Secondary | ICD-10-CM | POA: Diagnosis not present

## 2015-08-25 DIAGNOSIS — F411 Generalized anxiety disorder: Secondary | ICD-10-CM

## 2015-08-25 DIAGNOSIS — F329 Major depressive disorder, single episode, unspecified: Secondary | ICD-10-CM | POA: Diagnosis not present

## 2015-08-25 DIAGNOSIS — F32A Depression, unspecified: Secondary | ICD-10-CM

## 2015-08-25 MED ORDER — CLONAZEPAM 0.5 MG PO TABS: ORAL_TABLET | ORAL | Status: DC

## 2015-08-25 MED ORDER — BUPROPION HCL ER (SR) 150 MG PO TB12: 150.0000 mg | ORAL_TABLET | Freq: Two times a day (BID) | ORAL | Status: DC

## 2015-08-25 NOTE — Progress Notes (Signed)
Subjective:    Patient ID: Samantha Wang, female    DOB: Jan 13, 1971, 44 y.o.   MRN: [id number]  HPI Patient is here today for a follow up visit for depression. Patient is doing well. Patient states she has no concerns at this time.   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 However anxieties worsen. Feels she needs more mess. Next  A lot of stress. Doreene Adas passed away last year husband is still taking it hard as is patient.  Still a lot of stress at work with workplace continuing to discharge employees  Feels she may need higher dose of anxiety medicine Now    Review of Systems    no headache no chest pain no back pain ROS otherwise negative Objective:   Physical Exam Alert vital stable HEENT normal lungs clear heart rare rhythm       Assessment & Plan:  Impression 1 elevated blood pressure resolved #2 depression improved. #3 anxiety ongoing somewhat worse plan encouraged to use anxiolytic one half tablet twice a day no side effects. Maintain Prilosec. Maintain other meds maintain Wellbutrin 25 minutes spent most in discussion about the nature of anxiety and depression and its long-term treatment implications and connection WSL

## 2015-08-27 DIAGNOSIS — F411 Generalized anxiety disorder: Secondary | ICD-10-CM | POA: Insufficient documentation

## 2015-09-04 ENCOUNTER — Other Ambulatory Visit: Payer: Self-pay | Admitting: Family Medicine

## 2015-09-04 ENCOUNTER — Other Ambulatory Visit (INDEPENDENT_AMBULATORY_CARE_PROVIDER_SITE_OTHER): Payer: Self-pay | Admitting: Internal Medicine

## 2015-09-13 ENCOUNTER — Ambulatory Visit (INDEPENDENT_AMBULATORY_CARE_PROVIDER_SITE_OTHER): Payer: 59 | Admitting: Internal Medicine

## 2015-09-13 ENCOUNTER — Encounter (INDEPENDENT_AMBULATORY_CARE_PROVIDER_SITE_OTHER): Payer: Self-pay | Admitting: Internal Medicine

## 2015-09-13 VITALS — BP 108/68 | HR 70 | Temp 98.3°F | Resp 18 | Ht 62.0 in | Wt 142.8 lb

## 2015-09-13 DIAGNOSIS — Z862 Personal history of diseases of the blood and blood-forming organs and certain disorders involving the immune mechanism: Secondary | ICD-10-CM

## 2015-09-13 DIAGNOSIS — K279 Peptic ulcer, site unspecified, unspecified as acute or chronic, without hemorrhage or perforation: Secondary | ICD-10-CM | POA: Diagnosis not present

## 2015-09-13 DIAGNOSIS — K219 Gastro-esophageal reflux disease without esophagitis: Secondary | ICD-10-CM | POA: Diagnosis not present

## 2015-09-13 DIAGNOSIS — K589 Irritable bowel syndrome without diarrhea: Secondary | ICD-10-CM | POA: Diagnosis not present

## 2015-09-13 NOTE — Progress Notes (Signed)
Presenting complaint;  Follow-up for GERD peptic ulcer disease and IBS. History of iron deficiency anemia.  Subjective:  Patient is 44 year old Caucasian female who is here for scheduled visit. She was last seen on 03/01/2015. She has not experience epigastric pain or heartburn lately. On most days she is taking single dose of omeprazole. Occasionally she may take second dose in the evening. She is not taking iron twice a week as recommended. She continues to have postprandial cramping and abdominal pain diarrhea and urgency. She denies melena or rectal bleeding. She took dicyclomine for several weeks and she was not sure if it helped. Therefore she stopped it. She believes she is doing better since she was begun on Wellbutrin by Dr. Gerda Diss for stress disorder and anxiety. She states she went to eat at [employer] last Friday and half a thorough meal she developed abdominal pain and urgency and had to rush back home. She is always worried that these symptoms will catch her by surprise. Therefore she has cut back bleeding. She has lost 8 pounds since her last visit. She did try of probiotic for several weeks but developed abdominal pain and stopped it. The morning she has 2-3 bowel movements before she can leave for work. She remains under a lot of stress relating to loss of for 57 year old stepson last year.    Current Medications: Outpatient Encounter Prescriptions as of 09/13/2015  Medication Sig  . buPROPion (WELLBUTRIN SR) 150 MG 12 hr tablet Take 1 tablet (150 mg total) by mouth 2 (two) times daily.  . Cholecalciferol (VITAMIN D) 2000 UNITS tablet Take 1 tablet (2,000 Units total) by mouth every Monday.  . clonazePAM (KLONOPIN) 0.5 MG tablet 1/2 to 1 po BID prn anxiety  . Ferrous Sulfate (SLOW FE PO) Take by mouth. Patient's states that she is taking twice a week.  . fluticasone (FLONASE) 50 MCG/ACT nasal spray USE TWO SPRAY(S) IN EACH NOSTRIL ONCE DAILY AS NEEDED FOR CONGESTION --   **NEEDS  OFFICE  VISIT**  . loratadine (CLARITIN) 10 MG tablet Take 10 mg by mouth daily.  Marland Kitchen omeprazole (PRILOSEC) 20 MG capsule TAKE ONE CAPSULE BY MOUTH TWICE DAILY BEFORE A MEAL   No facility-administered encounter medications on file as of 09/13/2015.     Objective: Blood pressure 108/68, pulse 70, temperature 98.3 F (36.8 C), temperature source Oral, resp. rate 18, height  (1.575 m), weight 142 lb 12.8 oz (64.774 kg), last menstrual period 09/04/2015. Patient is alert and in no acute distress. Conjunctiva is pink. Sclera is nonicteric Oropharyngeal mucosa is normal. No neck masses or thyromegaly noted. Cardiac exam with regular rhythm normal S1 and S2. No murmur or gallop noted. Lungs are clear to auscultation. Abdomen is symmetrical. Bowel sounds are normal. On palpation abdomen is soft and nontender without organomegaly or masses. No LE edema or clubbing noted.  Labs/studies Results: Lab data from 03/01/2015 H&H 12.8 and 39.2 and platelet count 297K Serum iron was 83, TIBC 49 and saturation was 20% Serum ferritin 7.  Lab data from 08/30/2014. H&H 12.6 and 39.3 Serum iron 38, TIBC 406 and saturation 9% Serum ferritin was 9.   Lab data from 05/29/2014 H&H was 10.2 in 33.5 and MCV was 74.8. Serum iron 20, TIBC 453 and saturation 4% Serum ferritin was 1.     Assessment:  #1. History of peptic ulcer disease. She was diagnosed with gastric ulcers back in September 2009. Last EGD was in January 2012 and only one ulcer was noted. Others  had healed. She is not having any abdominal pain nausea or melena. Therefore I believe ulcer has healed. She was empirically treated for H pyloric gastritis. Multiple biopsies and endoscopic ultrasound were negative for IBD. #2. GERD. Heartburns well controlled with therapy. On most days she is only taking single dose of PPI. #3. History of iron deficiency anemia. H&H, serum iron TIBC and saturation were normal but serum ferritin was  still low at 10. She is due for repeat blood work. Etiology of iron deficiency anemia felt to be due to impaired iron absorption as her stools have remained guaiac-negative and there is no history of melena or rectal bleeding. #4. Irritable bowel syndrome. She is still having sporadic symptoms like she had one episode 4 days ago with postprandial cramping diarrhea and urgency. Symptoms appear to be due to stress. Will check CRP. If it is elevated we will proceed with small bowel follow-through. #5. Weight loss. She has lost 8 pounds in the last 6 months. Weight loss appears to be due to decrease in oral intake.  Plan:  Patient encouraged to take iron pill at least twice a week. She will go to the lab for CBC, CRP and serum ferritin. Patient will keep symptom diary and call with progress report in one month. Weight check in 2 months. Office visit in 6 months.

## 2015-09-13 NOTE — Patient Instructions (Signed)
Physician will call with results of blood tests when completed. Weight check in 2 months.

## 2015-09-14 LAB — C-REACTIVE PROTEIN: CRP: <0.5 mg/dL (ref <0.60)

## 2015-09-14 LAB — CBC
HEMATOCRIT: 40.7 % (ref 36.0–46.0)
HEMOGLOBIN: 13.3 g/dL (ref 12.0–15.0)
MCH: 29.2 pg (ref 26.0–34.0)
MCHC: 32.7 g/dL (ref 30.0–36.0)
MCV: 89.3 fL (ref 78.0–100.0)
MPV: 10.5 fL (ref 8.6–12.4)
Platelets: 335 10*3/uL (ref 150–400)
RBC: 4.56 MIL/uL (ref 3.87–5.11)
RDW: 13.6 % (ref 11.5–15.5)
WBC: 6 10*3/uL (ref 4.0–10.5)

## 2015-09-14 LAB — FERRITIN: Ferritin: 11 ng/mL (ref 10–291)

## 2015-09-20 ENCOUNTER — Telehealth (INDEPENDENT_AMBULATORY_CARE_PROVIDER_SITE_OTHER): Payer: Self-pay | Admitting: *Deleted

## 2015-09-20 NOTE — Telephone Encounter (Signed)
Patient states you mentioned her having a scan, she wants to know if she can have this before end of year. I didn't see what test you wanted her to have, please advise -- thanks (work # [phone])

## 2015-09-20 NOTE — Telephone Encounter (Signed)
Patient's call returned. He had terrible weekend with abdominal pain diarrhea and remains with poor appetite. Will proceed with abdominopelvic CT with contrast. Reason abdominal pain weight loss diarrhea and history of nonhealing gastric ulcer.

## 2015-09-21 ENCOUNTER — Other Ambulatory Visit (INDEPENDENT_AMBULATORY_CARE_PROVIDER_SITE_OTHER): Payer: Self-pay | Admitting: Internal Medicine

## 2015-09-21 DIAGNOSIS — R197 Diarrhea, unspecified: Secondary | ICD-10-CM

## 2015-09-21 DIAGNOSIS — R109 Unspecified abdominal pain: Secondary | ICD-10-CM

## 2015-09-21 DIAGNOSIS — R634 Abnormal weight loss: Secondary | ICD-10-CM

## 2015-09-21 NOTE — Telephone Encounter (Signed)
CT sch'd 09/27/15 at 215, pick up contrast, patient aware

## 2015-09-27 ENCOUNTER — Ambulatory Visit (HOSPITAL_COMMUNITY)
Admission: RE | Admit: 2015-09-27 | Discharge: 2015-09-27 | Disposition: A | Discharge status: Home / Self Care | Payer: 59 | Source: Ambulatory Visit | Attending: Internal Medicine | Admitting: Internal Medicine

## 2015-09-27 DIAGNOSIS — R197 Diarrhea, unspecified: Secondary | ICD-10-CM | POA: Insufficient documentation

## 2015-09-27 DIAGNOSIS — R634 Abnormal weight loss: Secondary | ICD-10-CM | POA: Diagnosis not present

## 2015-09-27 DIAGNOSIS — R109 Unspecified abdominal pain: Secondary | ICD-10-CM | POA: Insufficient documentation

## 2015-09-27 MED ORDER — IOHEXOL 300 MG/ML  SOLN
100.0000 mL | Freq: Once | INTRAMUSCULAR | Status: AC | PRN
  Administered 2015-09-27: 100 mL via INTRAVENOUS

## 2015-09-30 ENCOUNTER — Telehealth (INDEPENDENT_AMBULATORY_CARE_PROVIDER_SITE_OTHER): Payer: Self-pay | Admitting: *Deleted

## 2015-09-30 ENCOUNTER — Other Ambulatory Visit (INDEPENDENT_AMBULATORY_CARE_PROVIDER_SITE_OTHER): Payer: Self-pay | Admitting: Internal Medicine

## 2015-09-30 DIAGNOSIS — R634 Abnormal weight loss: Secondary | ICD-10-CM

## 2015-09-30 DIAGNOSIS — K219 Gastro-esophageal reflux disease without esophagitis: Secondary | ICD-10-CM

## 2015-09-30 DIAGNOSIS — R935 Abnormal findings on diagnostic imaging of other abdominal regions, including retroperitoneum: Secondary | ICD-10-CM

## 2015-09-30 DIAGNOSIS — D509 Iron deficiency anemia, unspecified: Secondary | ICD-10-CM

## 2015-09-30 NOTE — Telephone Encounter (Signed)
Patient ask if you call her M-F between 8 & 5 call her work# [phone] other times try her cell# [phone]. thanks

## 2015-09-30 NOTE — Telephone Encounter (Signed)
CT results reviewed with patient. 13.3 mm density in right breast may have to be further evaluated. Will review prior mammography with the radiologist. Regarding thickening to gastric antrum patient will need EGD. She has history of nonhealing gastric ulcer. Ann, please schedule EGD next week

## 2015-09-30 NOTE — Telephone Encounter (Signed)
EGD sch'd 10/11/15, patient aware

## 2015-10-11 ENCOUNTER — Encounter (HOSPITAL_COMMUNITY)
Admission: RE | Disposition: A | Discharge status: Home / Self Care | Payer: Self-pay | Source: Ambulatory Visit | Attending: Internal Medicine

## 2015-10-11 ENCOUNTER — Ambulatory Visit (HOSPITAL_COMMUNITY)
Admission: RE | Admit: 2015-10-11 | Discharge: 2015-10-11 | Disposition: A | Discharge status: Home / Self Care | Payer: 59 | Source: Ambulatory Visit | Attending: Internal Medicine | Admitting: Internal Medicine

## 2015-10-11 ENCOUNTER — Encounter (HOSPITAL_COMMUNITY): Payer: Self-pay | Admitting: *Deleted

## 2015-10-11 DIAGNOSIS — R935 Abnormal findings on diagnostic imaging of other abdominal regions, including retroperitoneum: Secondary | ICD-10-CM | POA: Diagnosis not present

## 2015-10-11 DIAGNOSIS — R933 Abnormal findings on diagnostic imaging of other parts of digestive tract: Secondary | ICD-10-CM | POA: Insufficient documentation

## 2015-10-11 DIAGNOSIS — R109 Unspecified abdominal pain: Secondary | ICD-10-CM | POA: Diagnosis present

## 2015-10-11 DIAGNOSIS — K219 Gastro-esophageal reflux disease without esophagitis: Secondary | ICD-10-CM | POA: Insufficient documentation

## 2015-10-11 DIAGNOSIS — K259 Gastric ulcer, unspecified as acute or chronic, without hemorrhage or perforation: Secondary | ICD-10-CM | POA: Insufficient documentation

## 2015-10-11 DIAGNOSIS — K253 Acute gastric ulcer without hemorrhage or perforation: Secondary | ICD-10-CM | POA: Diagnosis not present

## 2015-10-11 DIAGNOSIS — K449 Diaphragmatic hernia without obstruction or gangrene: Secondary | ICD-10-CM | POA: Diagnosis not present

## 2015-10-11 DIAGNOSIS — Z8711 Personal history of peptic ulcer disease: Secondary | ICD-10-CM | POA: Diagnosis not present

## 2015-10-11 DIAGNOSIS — F419 Anxiety disorder, unspecified: Secondary | ICD-10-CM | POA: Insufficient documentation

## 2015-10-11 DIAGNOSIS — D509 Iron deficiency anemia, unspecified: Secondary | ICD-10-CM

## 2015-10-11 DIAGNOSIS — Z79899 Other long term (current) drug therapy: Secondary | ICD-10-CM | POA: Diagnosis not present

## 2015-10-11 DIAGNOSIS — R634 Abnormal weight loss: Secondary | ICD-10-CM | POA: Insufficient documentation

## 2015-10-11 HISTORY — DX: Gastro-esophageal reflux disease without esophagitis: K21.9

## 2015-10-11 HISTORY — PX: ESOPHAGOGASTRODUODENOSCOPY: SHX5428

## 2015-10-11 HISTORY — DX: Personal history of other diseases of the digestive system: Z87.19

## 2015-10-11 SURGERY — EGD (ESOPHAGOGASTRODUODENOSCOPY)
Anesthesia: Moderate Sedation

## 2015-10-11 MED ORDER — SODIUM CHLORIDE 0.9 % IV SOLN
INTRAVENOUS | Status: DC
  Administered 2015-10-11: 1000 mL via INTRAVENOUS

## 2015-10-11 MED ORDER — MIDAZOLAM HCL 5 MG/5ML IJ SOLN
INTRAMUSCULAR | Status: AC
  Filled 2015-10-11: qty 10

## 2015-10-11 MED ORDER — BUTAMBEN-TETRACAINE-BENZOCAINE 2-2-14 % EX AERO
INHALATION_SPRAY | CUTANEOUS | Status: DC | PRN
  Administered 2015-10-11: 2 via TOPICAL

## 2015-10-11 MED ORDER — MEPERIDINE HCL 50 MG/ML IJ SOLN
INTRAMUSCULAR | Status: DC | PRN
  Administered 2015-10-11 (×2): 25 mg via INTRAVENOUS

## 2015-10-11 MED ORDER — STERILE WATER FOR IRRIGATION IR SOLN
Status: DC | PRN
  Administered 2015-10-11: 13:00:00

## 2015-10-11 MED ORDER — MIDAZOLAM HCL 5 MG/5ML IJ SOLN
INTRAMUSCULAR | Status: AC
  Filled 2015-10-11: qty 5

## 2015-10-11 MED ORDER — MEPERIDINE HCL 50 MG/ML IJ SOLN
INTRAMUSCULAR | Status: AC
  Filled 2015-10-11: qty 1

## 2015-10-11 MED ORDER — MIDAZOLAM HCL 5 MG/5ML IJ SOLN
INTRAMUSCULAR | Status: DC | PRN
  Administered 2015-10-11: 1 mg via INTRAVENOUS
  Administered 2015-10-11: 2 mg via INTRAVENOUS
  Administered 2015-10-11 (×3): 3 mg via INTRAVENOUS

## 2015-10-11 NOTE — Discharge Instructions (Signed)
Resume usual medications and diet. Do not take aspirin Advil or Aleve or similar medications. No driving for 24 hours. Physician will call with biopsy results Esophagogastroduodenoscopy, Care After Refer to this sheet in the next few weeks. These instructions provide you with information about caring for yourself after your procedure. Your health care provider may also give you more specific instructions. Your treatment has been planned according to current medical practices, but problems sometimes occur. Call your health care provider if you have any problems or questions after your procedure. WHAT TO EXPECT AFTER THE PROCEDURE After your procedure, it is typical to feel:  Soreness in your throat.  Bloated.  HOME CARE INSTRUCTIONS  Do not eat or drink anything until the numbing medicine (local anesthetic) has worn off and your gag reflex has returned. You will know that the local anesthetic has worn off when you can swallow comfortably.  Do not drive or operate machinery for 24 hours.  Take medicines only as directed by your health care provider. SEEK MEDICAL CARE IF:   You cannot stop coughing.  You are not urinating at all or less than usual. SEEK IMMEDIATE MEDICAL CARE IF:  You have difficulty swallowing.  You cannot eat or drink.  You have worsening throat or chest pain.  You have dizziness or lightheadedness or you faint.  You have nausea or vomiting.  You have chills.  You have a fever.  You have severe abdominal pain.  You have black, tarry, or bloody stools.   This information is not intended to replace advice given to you by your health care provider. Make sure you discuss any questions you have with your health care provider.   Document Released: 09/24/2012 Document Revised: 10/29/2014 Document Reviewed: 09/24/2012 Elsevier Interactive Patient Education [employer]2016 Elsevier Inc.

## 2015-10-11 NOTE — Op Note (Signed)
EGD PROCEDURE REPORT  PATIENT:  Samantha ContrasStephanie A Tiedeman  MR#:  [id number] Birthdate:  04/12/1971, 44 y.o., female Endoscopist:  Dr. Malissa HippoNajeeb U. Elek Holderness, MD  Procedure Date: 10/11/2015  Procedure:   EGD  Indications:  Patient is 44 year old Caucasian female was history of nonhealing peptic ulcer disease who was evaluated for abdominal pain and weight loss. CT revealed Mark thickening to antral mucosa. She is therefore returning for diagnostic EGD. Last EGD was in January 2012 revealing only one ulcer which appeared to be healing.  She has been empirically treated for H. pylori infection. She also [facility] revealing benign etiology. She does not take NSAIDs.           Informed Consent:  The risks, benefits, alternatives & imponderables which include, but are not limited to, bleeding, infection, perforation, drug reaction and potential missed lesion have been reviewed.  The potential for biopsy, lesion removal, esophageal dilation, etc. have also been discussed.  Questions have been answered.  All parties agreeable.  Please see history & physical in medical record for more information.  Medications:  Demerol 50 mg IV Versed 12 mg IV Cetacaine spray topically for oropharyngeal anesthesia  Description of procedure:  The endoscope was introduced through the mouth and advanced to the second portion of the duodenum without difficulty or limitations. The mucosal surfaces were surveyed very carefully during advancement of the scope and upon withdrawal.  Findings:  Esophagus: Mucosa of the esophagus was normal. GE junction was unremarkable. GEJ:  36 cm Hiatus:  38 cm Stomach:  Stomach was empty and distended very well with insufflation. Folds in the proximal stomach were normal. Examination of gastric mucosa at body was normal. Noncritical narrowing noted to antrum with pseudo-pylorus appearance. Angularis fundus and cardia were unremarkable. Two small healing ulcers noted at antrum along  with large approximately 20 mm ulcer with clean base and prepyloric region. Pyloric channel patent. Duodenum:  Normal bulbar and post bulbar mucosa.  Therapeutic/Diagnostic Maneuvers Performed:   Multiple biopsies taken from ulcer margin.  Complications:  None  EBL: Minimal  Impression: Small sliding hiatal hernia without evidence of erosive esophagitis. Noncritical narrowing at antrum with appearance of pseudo-pylorus. Two small antral ulcers nearly healed. Large approximately 20 mm ulcer in prepyloric region towards lesser curvature. Multiple biopsies taken.  Recommendations:  Standard instructions given. I will be contacting patient with biopsy results.  Carel Schnee U  10/11/2015  1:37 PM  CC: Dr. Lubertha SouthSteve Luking, MD & Dr. Bonnetta BarryNo ref. provider found

## 2015-10-11 NOTE — H&P (Signed)
Antony ContrasStephanie A Conard is an 44 y.o. female.   Chief Complaint: Patient is here for EGD. HPI: Patient is 44 year old Caucasian female was over six-year history of known peptic ulcer disease was recently evaluated for 10 pound weight loss abdominal pain. She underwent abdominopelvic CT. Reveals marked thickening to antral mucosa. She is here for returning for diagnostic EGD. She denies nausea vomiting epigastric pain melena or rectal bleeding. She has iron deficiency anemia felt to be due to impaired iron absorption because she's been on PPI chronically. Steege he was in January 2012 and she was noted to have 1 ulcer. Rest of the ulcers had healed. She is always tested negative for H. pylori has been empirically treated. She also had endoscopic ultrasound with deeper biopsies and these are negative for IBD.  Past Medical History  Diagnosis Date  . Peptic ulcer disease   . Breast nodule     left   . H/O cesarean section     history of 2 c-sections    . Reflux   . Allergy   . Anxiety   . GERD (gastroesophageal reflux disease)   . History of hiatal hernia   . Anemia     Past Surgical History  Procedure Laterality Date  . Upper gastrointestinal endoscopy    . Cesarean section  [id number]  . Cesarean section      x2    Family History  Problem Relation Age of Onset  . Healthy Mother   . Hyperlipidemia Father   . Healthy Brother   . Healthy Son   . Healthy Daughter    Social History:  reports that she has never smoked. She has never used smokeless tobacco. She reports that she does not drink alcohol or use illicit drugs.  Allergies: No Known Allergies  Medications Prior to Admission  Medication Sig Dispense Refill  . buPROPion (WELLBUTRIN SR) 150 MG 12 hr tablet Take 1 tablet (150 mg total) by mouth 2 (two) times daily. 60 tablet 3  . Cholecalciferol (VITAMIN D) 2000 UNITS tablet Take 1 tablet (2,000 Units total) by mouth every Monday.    . clonazePAM (KLONOPIN) 0.5 MG tablet 1/2 to 1  po BID prn anxiety 30 tablet 3  . Ferrous Sulfate (SLOW FE PO) Take by mouth. Patient's states that she is taking twice a week.    . fluticasone (FLONASE) 50 MCG/ACT nasal spray USE TWO SPRAY(S) IN EACH NOSTRIL ONCE DAILY AS NEEDED FOR CONGESTION --  **NEEDS  OFFICE  VISIT** 16 g 5  . loratadine (CLARITIN) 10 MG tablet Take 10 mg by mouth daily.    Marland Kitchen. omeprazole (PRILOSEC) 20 MG capsule TAKE ONE CAPSULE BY MOUTH TWICE DAILY BEFORE A MEAL 60 capsule 0    No results found for this or any previous visit (from the past 48 hour(s)). No results found.  ROS  Blood pressure 126/84, pulse 89, temperature 98.6 F (37 C), temperature source Oral, resp. rate 12, height 5\' 2"  (1.575 m), weight 142 lb (64.411 kg), last menstrual period 09/04/2015, SpO2 100 %. Physical Exam  Constitutional: She appears well-developed and well-nourished.  HENT:  Mouth/Throat: Oropharynx is clear and moist.  Eyes: Conjunctivae are normal. No scleral icterus.  Neck: No thyromegaly present.  Cardiovascular: Normal rate, regular rhythm and normal heart sounds.   No murmur heard. Respiratory: Effort normal and breath sounds normal.  GI: Soft. She exhibits no distension and no mass. There is no tenderness.  Musculoskeletal: She exhibits no edema.  Lymphadenopathy:    She  has no cervical adenopathy.  Neurological: She is alert.  Skin: Skin is warm and dry.     Assessment/Plan History of nonhealing peptic ulcer disease. Abnormal CT revealing marked thickening to antral mucosa.. Diagnostic EGD.  REHMAN,NAJEEB U 10/11/2015, 12:54 PM

## 2015-10-19 ENCOUNTER — Encounter (HOSPITAL_COMMUNITY): Payer: Self-pay | Admitting: Internal Medicine

## 2015-10-23 HISTORY — PX: PARTIAL GASTRECTOMY: SHX2172

## 2015-10-25 ENCOUNTER — Other Ambulatory Visit (INDEPENDENT_AMBULATORY_CARE_PROVIDER_SITE_OTHER): Payer: Self-pay | Admitting: *Deleted

## 2015-10-25 ENCOUNTER — Other Ambulatory Visit (INDEPENDENT_AMBULATORY_CARE_PROVIDER_SITE_OTHER): Payer: Self-pay | Admitting: Internal Medicine

## 2015-10-25 ENCOUNTER — Telehealth (INDEPENDENT_AMBULATORY_CARE_PROVIDER_SITE_OTHER): Payer: Self-pay | Admitting: Internal Medicine

## 2015-10-25 MED ORDER — PANTOPRAZOLE SODIUM 40 MG PO TBEC: 40.0000 mg | DELAYED_RELEASE_TABLET | Freq: Two times a day (BID) | ORAL | Status: DC

## 2015-10-25 MED ORDER — OMEPRAZOLE 20 MG PO CPDR: 20.0000 mg | DELAYED_RELEASE_CAPSULE | Freq: Two times a day (BID) | ORAL | Status: DC

## 2015-10-25 NOTE — Telephone Encounter (Signed)
OV sch'd 01/10/16, left detailed message for patient

## 2015-10-25 NOTE — Telephone Encounter (Signed)
Per Dr.Rehman the patient is to be on the Omeprazole 20 mg twice a day. A rx has been sent to her pharmacy. Patient was called and made aware, we will need to check on the patient's 12 week post procedure appointment. Procedure was done December 2016.

## 2015-10-25 NOTE — Telephone Encounter (Signed)
Ms. Samantha Wang called saying she'd spoken to Dr. Karilyn Cotaehman around Christmas and was told he'd prescribe a different medication for her ulcer and reflux. She's not received a phone call from her pharmacy regarding this. Please give her a call.  Pt's ph# [phone] Thank you.

## 2015-10-25 NOTE — Telephone Encounter (Signed)
Dr.Rehman states that this is what he wants her on. Another Rx sent to the patient's pharmacy.

## 2015-12-06 ENCOUNTER — Ambulatory Visit (INDEPENDENT_AMBULATORY_CARE_PROVIDER_SITE_OTHER): Payer: 59 | Admitting: Family Medicine

## 2015-12-06 ENCOUNTER — Encounter: Payer: Self-pay | Admitting: Family Medicine

## 2015-12-06 VITALS — BP 132/92 | Ht 62.0 in | Wt 145.0 lb

## 2015-12-06 DIAGNOSIS — F411 Generalized anxiety disorder: Secondary | ICD-10-CM

## 2015-12-06 DIAGNOSIS — K219 Gastro-esophageal reflux disease without esophagitis: Secondary | ICD-10-CM

## 2015-12-06 DIAGNOSIS — IMO0001 Reserved for inherently not codable concepts without codable children: Secondary | ICD-10-CM

## 2015-12-06 DIAGNOSIS — F329 Major depressive disorder, single episode, unspecified: Secondary | ICD-10-CM

## 2015-12-06 DIAGNOSIS — R030 Elevated blood-pressure reading, without diagnosis of hypertension: Secondary | ICD-10-CM

## 2015-12-06 DIAGNOSIS — F32A Depression, unspecified: Secondary | ICD-10-CM

## 2015-12-06 MED ORDER — BUPROPION HCL ER (SR) 150 MG PO TB12: 150.0000 mg | ORAL_TABLET | Freq: Two times a day (BID) | ORAL | Status: DC

## 2015-12-06 MED ORDER — CLONAZEPAM 0.5 MG PO TABS: ORAL_TABLET | ORAL | Status: DC

## 2015-12-06 NOTE — Progress Notes (Signed)
Subjective:    Patient ID: Samantha Wang, female    DOB: 03-26-1971, 45 y.o.   MRN: [id number]  HPIMed check up. Follow up on depression. Taking wellbutrin 150mg  one bid. Well has helped with mood, handling well, still having anxiety, nore energy. Notes overall medicine definitely has helped. No obvious side effects. States she needs to stay on it.  BP at home, numbers are generally in good control. Has been checked multiple other places. Review of charts today shows blood pressure good 5 out of 6 visits last year elsewhere.  Pt states bp has been running a little high. Watching salt intake no current meds  Patient very concerned about her gastric ulcer. See Dr. Inge Rise notes. Continues to not improve. Dr. Dionicia Abler raise possibility of surgery. Patient hopes avoid this. Claims compliance with her acid blocker wonders if she should go on disability to try to calm down her stress and improve her ulcer    Review of Systems No headache no chest pain no back pain no current abdominal pain no change in bowel habits    Objective:   Physical Exam  Alert vitals stable blood pressure 134/92 on repeat similar bilateral HEENT normal lungs clear. Heart regular in rhythm. Affect within normal limits nondepressed      Assessment & Plan:  Patient arrives office with several distinct concerns. Impression 1 depression with element of anxiety clinically stable to maintain same meds meds reviewed #2 elevated blood pressure numbers deftly up today but overall good the past year 2 early to consider medicine rationale discussed #3 peptic ulcer disease also discussed I would not recommend disability at this time asked her to follow-up with GI Dr. this regard maintain compliance protonic general nature of ulcers discussed plan 25 minutes spent most in discussion all medications refilled diet exercise discussed recheck in 6 months WSL exercise strongly encouraged

## 2015-12-22 ENCOUNTER — Emergency Department (HOSPITAL_COMMUNITY): Payer: 59

## 2015-12-22 ENCOUNTER — Emergency Department (HOSPITAL_COMMUNITY)
Admission: EM | Admit: 2015-12-22 | Discharge: 2015-12-22 | Disposition: A | Discharge status: Home / Self Care | Payer: 59 | Attending: Emergency Medicine | Admitting: Emergency Medicine

## 2015-12-22 ENCOUNTER — Encounter (HOSPITAL_COMMUNITY): Payer: Self-pay | Admitting: Emergency Medicine

## 2015-12-22 DIAGNOSIS — S199XXA Unspecified injury of neck, initial encounter: Secondary | ICD-10-CM | POA: Diagnosis present

## 2015-12-22 DIAGNOSIS — K219 Gastro-esophageal reflux disease without esophagitis: Secondary | ICD-10-CM | POA: Diagnosis not present

## 2015-12-22 DIAGNOSIS — Z8711 Personal history of peptic ulcer disease: Secondary | ICD-10-CM | POA: Insufficient documentation

## 2015-12-22 DIAGNOSIS — Z8742 Personal history of other diseases of the female genital tract: Secondary | ICD-10-CM | POA: Insufficient documentation

## 2015-12-22 DIAGNOSIS — S161XXA Strain of muscle, fascia and tendon at neck level, initial encounter: Secondary | ICD-10-CM | POA: Diagnosis not present

## 2015-12-22 DIAGNOSIS — Z79899 Other long term (current) drug therapy: Secondary | ICD-10-CM | POA: Insufficient documentation

## 2015-12-22 DIAGNOSIS — F419 Anxiety disorder, unspecified: Secondary | ICD-10-CM | POA: Diagnosis not present

## 2015-12-22 DIAGNOSIS — D649 Anemia, unspecified: Secondary | ICD-10-CM | POA: Diagnosis not present

## 2015-12-22 DIAGNOSIS — Y998 Other external cause status: Secondary | ICD-10-CM | POA: Diagnosis not present

## 2015-12-22 DIAGNOSIS — Z7951 Long term (current) use of inhaled steroids: Secondary | ICD-10-CM | POA: Diagnosis not present

## 2015-12-22 DIAGNOSIS — Y92410 Unspecified street and highway as the place of occurrence of the external cause: Secondary | ICD-10-CM | POA: Insufficient documentation

## 2015-12-22 DIAGNOSIS — Y9389 Activity, other specified: Secondary | ICD-10-CM | POA: Insufficient documentation

## 2015-12-22 MED ORDER — TRAMADOL HCL 50 MG PO TABS
50.0000 mg | ORAL_TABLET | Freq: Once | ORAL | Status: DC
  Filled 2015-12-22: qty 1

## 2015-12-22 MED ORDER — CYCLOBENZAPRINE HCL 5 MG PO TABS: 5.0000 mg | ORAL_TABLET | Freq: Three times a day (TID) | ORAL | Status: DC | PRN

## 2015-12-22 MED ORDER — TRAMADOL HCL 50 MG PO TABS: 50.0000 mg | ORAL_TABLET | Freq: Four times a day (QID) | ORAL | Status: DC | PRN

## 2015-12-22 NOTE — ED Notes (Signed)
Pt alert & oriented x4, stable gait. Patient given discharge instructions, paperwork & prescription(s). Patient  instructed to stop at the registration desk to finish any additional paperwork. Patient verbalized understanding. Pt left department w/ no further questions. 
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
## 2016-01-05 ENCOUNTER — Encounter: Payer: Self-pay | Admitting: Family Medicine

## 2016-01-05 ENCOUNTER — Ambulatory Visit (INDEPENDENT_AMBULATORY_CARE_PROVIDER_SITE_OTHER): Payer: Self-pay | Admitting: Family Medicine

## 2016-01-05 VITALS — BP 126/84 | Ht 62.0 in | Wt 144.0 lb

## 2016-01-05 DIAGNOSIS — M542 Cervicalgia: Secondary | ICD-10-CM
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
## 2016-01-10 ENCOUNTER — Ambulatory Visit (INDEPENDENT_AMBULATORY_CARE_PROVIDER_SITE_OTHER): Payer: 59 | Admitting: Internal Medicine

## 2016-01-10 ENCOUNTER — Encounter (INDEPENDENT_AMBULATORY_CARE_PROVIDER_SITE_OTHER): Payer: Self-pay | Admitting: Internal Medicine

## 2016-01-10 ENCOUNTER — Other Ambulatory Visit (INDEPENDENT_AMBULATORY_CARE_PROVIDER_SITE_OTHER): Payer: Self-pay | Admitting: Internal Medicine

## 2016-01-10 ENCOUNTER — Encounter (INDEPENDENT_AMBULATORY_CARE_PROVIDER_SITE_OTHER): Payer: Self-pay | Admitting: *Deleted

## 2016-01-10 VITALS — BP 130/80 | HR 72 | Temp 98.9°F | Resp 18 | Ht 62.0 in | Wt 146.8 lb

## 2016-01-10 DIAGNOSIS — K279 Peptic ulcer, site unspecified, unspecified as acute or chronic, without hemorrhage or perforation: Secondary | ICD-10-CM

## 2016-01-10 DIAGNOSIS — Z862 Personal history of diseases of the blood and blood-forming organs and certain disorders involving the immune mechanism: Secondary | ICD-10-CM | POA: Diagnosis not present

## 2016-01-10 DIAGNOSIS — K219 Gastro-esophageal reflux disease without esophagitis: Secondary | ICD-10-CM

## 2016-01-10 DIAGNOSIS — K589 Irritable bowel syndrome without diarrhea: Secondary | ICD-10-CM | POA: Diagnosis not present

## 2016-01-10 NOTE — Progress Notes (Signed)
Presenting complaint;  Follow-up for GERD, peptic ulcer disease IBS and iron deficiency anemia.  Subjective:  Samantha Wang's 45 year old Caucasian female who is here for scheduled visit. She was last seen on 09/13/2015. She had esophagogastroduodenoscopy 3 months ago and was found to have 2 small antral ulcers along with one 20 mm prepyloric ulcer. Biopsies were negative for malignancy and/or H. pylori infection. PPI was changed from omeprazole to pantoprazole. She now returns for follow-up visit. She has no complaints other than intermittent diarrhea and urgency. She denies heartburn nausea vomiting or abdominal pain. She has intermittent diarrhea with urgency. When she has these spells she may have 3 to 4 bowel movements before she goes to work. She does not have any problem breast of the day. All of her stools are formed or soft. She has never been constipated. She denies melena or rectal bleeding or nocturnal diarrhea. She feels stressed level as improved and she is also coping better. She exercises 5 days each week. He has gained 4 pounds since last visit. She does not take OTC NSAIDs.   Current Medications: Outpatient Encounter Prescriptions as of 01/10/2016  Medication Sig  . buPROPion (WELLBUTRIN SR) 150 MG 12 hr tablet Take 1 tablet (150 mg total) by mouth 2 (two) times daily.  . Cholecalciferol (VITAMIN D) 2000 UNITS tablet Take 1 tablet (2,000 Units total) by mouth every Monday.  . clonazePAM (KLONOPIN) 0.5 MG tablet 1/2 to 1 po BID prn anxiety  . Ferrous Sulfate (SLOW FE PO) Take by mouth. Patient's states that she is taking twice a week.  . fluticasone (FLONASE) 50 MCG/ACT nasal spray USE TWO SPRAY(S) IN EACH NOSTRIL ONCE DAILY AS NEEDED FOR CONGESTION --  **NEEDS  OFFICE  VISIT**  . loratadine (CLARITIN) 10 MG tablet Take 10 mg by mouth daily.  . pantoprazole (PROTONIX) 40 MG tablet Take 1 tablet (40 mg total) by mouth 2 (two) times daily before a meal.  . [DISCONTINUED]  cyclobenzaprine (FLEXERIL) 5 MG tablet Take 1 tablet (5 mg total) by mouth 3 (three) times daily as needed for muscle spasms. (Patient not taking: Reported on 01/10/2016)  . [DISCONTINUED] traMADol (ULTRAM) 50 MG tablet Take 1 tablet (50 mg total) by mouth every 6 (six) hours as needed. (Patient not taking: Reported on 01/10/2016)   No facility-administered encounter medications on file as of 01/10/2016.    Objective: Blood pressure 130/80, pulse 72, temperature 98.9 F (37.2 C), temperature source Oral, resp. rate 18, height  (1.575 m), weight 146 lb 12.8 oz (66.588 kg), last menstrual period 12/23/2015. Patient is alert and in no acute distress. Conjunctiva is pink. Sclera is nonicteric Oropharyngeal mucosa is normal. No neck masses or thyromegaly noted. Cardiac exam with regular rhythm normal S1 and S2. No murmur or gallop noted. Lungs are clear to auscultation. Abdomen is symmetrical and soft with mild midepigastric tenderness to the right of midline. No organomegaly or masses. No LE edema or clubbing noted.  Labs/studies Results: Lab data from 09/13/2015  WBC 6.0, H&H 13.3 and 40.7 and platelet count 335K  Serum ferritin was 11. Serum ferritin was 1 on 05/29/2014.  Assessment:  #1. Peptic ulcer disease. She has refractory disease. She has been empirically treated for H. pylori gastritis and she also has had EGD with EUS with deeper biopsies at [facility] which were negative for Crohn's disease. EGD three months ago revealed one large prepyloric gastric ulcer and two smaller antral ulcers. She is now on double dose PPI and doing well. #2. GERD.  Symptoms are well controlled with therapy. #3. Irritable bowel syndrome. She is having intermittent symptoms and presently on no therapy. #4. History of iron deficiency anemia most likely secondary to impaired iron absorption due to chronic PPI therapy. She has never been documented to have heme-positive stools but she could've been losing blood  intermittently from peptic ulcer disease.   Plan:  Continue pantoprazole at 40 mg by mouth twice a day until EGD next month. Continue Slow Fe 1 tablet twice a week. EGD in 4-6 weeks. She will have CBC and serum ferritin at the time of EGD. Office visit in 6 months.

## 2016-01-10 NOTE — Patient Instructions (Signed)
Esophagogastroduodenoscopy to be scheduled in 4 weeks. Will do blood work and the time of endoscopy(CBC and serum ferritin).

## 2016-01-18 ENCOUNTER — Telehealth: Payer: Self-pay | Admitting: *Deleted

## 2016-01-18 NOTE — Telephone Encounter (Signed)
Patient called to cancel appointment for 01/19/16 due to her coming down with the flu. Rescheduled to 02/23/16 with Ms. Debbie.

## 2016-01-19 ENCOUNTER — Ambulatory Visit: Payer: 59 | Admitting: Certified Nurse Midwife

## 2016-01-26 ENCOUNTER — Encounter: Payer: Self-pay | Admitting: Family Medicine

## 2016-01-26 ENCOUNTER — Ambulatory Visit (INDEPENDENT_AMBULATORY_CARE_PROVIDER_SITE_OTHER): Payer: Self-pay | Admitting: Family Medicine

## 2016-01-26 VITALS — BP 112/74 | Ht 62.0 in | Wt 147.6 lb

## 2016-01-26 DIAGNOSIS — M542 Cervicalgia: Secondary | ICD-10-CM
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
## 2016-02-08 ENCOUNTER — Telehealth (INDEPENDENT_AMBULATORY_CARE_PROVIDER_SITE_OTHER): Payer: Self-pay | Admitting: *Deleted

## 2016-02-08 NOTE — Telephone Encounter (Signed)
Samantha CornfieldStephanie called and canceled EGD sch'd 02/16/16. She says her insurance has a changed and it will cost her over 800.00 for her part of the procedure -- if anything gets worse she will let us know

## 2016-02-08 NOTE — Telephone Encounter (Signed)
Patient's message noted

## 2016-02-16 ENCOUNTER — Ambulatory Visit (HOSPITAL_COMMUNITY): Admission: RE | Admit: 2016-02-16 | Payer: 59 | Source: Ambulatory Visit | Admitting: Internal Medicine

## 2016-02-16 ENCOUNTER — Encounter (HOSPITAL_COMMUNITY): Admission: RE | Payer: Self-pay | Source: Ambulatory Visit

## 2016-02-16 SURGERY — EGD (ESOPHAGOGASTRODUODENOSCOPY)
Anesthesia: Moderate Sedation

## 2016-02-23 ENCOUNTER — Ambulatory Visit: Payer: 59 | Admitting: Certified Nurse Midwife

## 2016-03-06 ENCOUNTER — Other Ambulatory Visit (INDEPENDENT_AMBULATORY_CARE_PROVIDER_SITE_OTHER): Payer: Self-pay | Admitting: Internal Medicine

## 2016-03-08 ENCOUNTER — Ambulatory Visit (INDEPENDENT_AMBULATORY_CARE_PROVIDER_SITE_OTHER): Payer: 59 | Admitting: Certified Nurse Midwife

## 2016-03-08 ENCOUNTER — Encounter: Payer: Self-pay | Admitting: Certified Nurse Midwife

## 2016-03-08 VITALS — BP 106/64 | HR 68 | Resp 16 | Ht 62.25 in | Wt 146.0 lb

## 2016-03-08 DIAGNOSIS — Z01419 Encounter for gynecological examination (general) (routine) without abnormal findings: Secondary | ICD-10-CM

## 2016-03-08 DIAGNOSIS — Z124 Encounter for screening for malignant neoplasm of cervix: Secondary | ICD-10-CM | POA: Diagnosis not present

## 2016-03-08 DIAGNOSIS — Z0000 Encounter for general adult medical examination without abnormal findings: Secondary | ICD-10-CM

## 2016-03-08 LAB — COMPREHENSIVE METABOLIC PANEL
ALT: 10 U/L (ref 6–29)
AST: 14 U/L (ref 10–30)
Albumin: 4.2 g/dL (ref 3.6–5.1)
Alkaline Phosphatase: 45 U/L (ref 33–115)
BUN: 11 mg/dL (ref 7–25)
CHLORIDE: 107 mmol/L (ref 98–110)
CO2: 25 mmol/L (ref 20–31)
CREATININE: 0.8 mg/dL (ref 0.50–1.10)
Calcium: 8.7 mg/dL (ref 8.6–10.2)
GLUCOSE: 87 mg/dL (ref 65–99)
POTASSIUM: 4.1 mmol/L (ref 3.5–5.3)
SODIUM: 139 mmol/L (ref 135–146)
Total Bilirubin: 0.2 mg/dL (ref 0.2–1.2)
Total Protein: 6.9 g/dL (ref 6.1–8.1)

## 2016-03-08 LAB — LIPID PANEL
CHOL/HDL RATIO: 2.5 ratio (ref <=5.0)
Cholesterol: 161 mg/dL (ref 125–200)
HDL: 64 mg/dL (ref >=46)
LDL CALC: 83 mg/dL (ref <130)
TRIGLYCERIDES: 68 mg/dL (ref <150)
VLDL: 14 mg/dL (ref <30)

## 2016-03-08 LAB — TSH: TSH: 2.23 mIU/L

## 2016-03-08 NOTE — Patient Instructions (Signed)
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
## 2016-03-09 LAB — RPR

## 2016-03-09 LAB — HEPATITIS C ANTIBODY: HCV Ab: NEGATIVE

## 2016-03-09 NOTE — Progress Notes (Signed)
Reviewed personally.  M. Suzanne Benford Asch, MD.  

## 2016-03-13 ENCOUNTER — Ambulatory Visit (INDEPENDENT_AMBULATORY_CARE_PROVIDER_SITE_OTHER): Payer: 59 | Admitting: Internal Medicine

## 2016-03-13 ENCOUNTER — Telehealth: Payer: Self-pay | Admitting: Certified Nurse Midwife

## 2016-03-13 NOTE — Telephone Encounter (Signed)
Routing to Deborah Leonard CNM as FYI. Will close encounter. 

## 2016-03-13 NOTE — Telephone Encounter (Signed)
Spoke with patient. Advised I have reviewed a copy of her wellness form. Advised I see two areas that may have been resign for denial, but I cannot guarantee these are the reasons. Advised she needed to sign the top half of the wellness form and the date of service needed to be listed. The patient is agreeable. She will bring a new wellness form by the office with the top completed by her and signed. I will then complete the bottom half for Samantha Wang CNM to sign. I will notify her once this form has been refaxed. She is agreeable.

## 2016-03-13 NOTE — Telephone Encounter (Signed)
Spoke with patient. Patient states that she left a health form with Leota SauersDeborah Leonard CNM at her aex on 03/08/2016 to be faxed to her job. Reports this has been denied and she is unsure why. Asking if there was something missing from the form. Form is not available in EP[employer]. Advised I will send a message to Leota Sauerseborah Leonard CNM and Joy regarding health form and return call with further information. Advised this form may have been sent to the scan center to be scanned into her chart. If this is the case it will take a couple of days for it to be scanned. The patient is agreeable.  Cc: Cornelia CopaJoy Johnson

## 2016-03-13 NOTE — Telephone Encounter (Signed)
Patient would like to speak with nurse about the health form she gave Debbie at last visit.

## 2016-03-13 NOTE — Telephone Encounter (Signed)
Patient came in the office and signed and dated original  form. I faxed the form to the number listed on form.

## 2016-03-13 NOTE — Telephone Encounter (Signed)
Was given to Arna Mediciora to have faxed to company and for her chart the day after her visit

## 2016-03-14 LAB — IPS PAP TEST WITH HPV

## 2016-04-12 ENCOUNTER — Encounter (INDEPENDENT_AMBULATORY_CARE_PROVIDER_SITE_OTHER): Payer: Self-pay | Admitting: *Deleted

## 2016-04-13 ENCOUNTER — Other Ambulatory Visit (INDEPENDENT_AMBULATORY_CARE_PROVIDER_SITE_OTHER): Payer: Self-pay | Admitting: *Deleted

## 2016-04-13 DIAGNOSIS — K279 Peptic ulcer, site unspecified, unspecified as acute or chronic, without hemorrhage or perforation: Secondary | ICD-10-CM

## 2016-04-13 DIAGNOSIS — K219 Gastro-esophageal reflux disease without esophagitis: Secondary | ICD-10-CM

## 2016-06-04 ENCOUNTER — Ambulatory Visit (INDEPENDENT_AMBULATORY_CARE_PROVIDER_SITE_OTHER): Payer: 59 | Admitting: Family Medicine

## 2016-06-04 ENCOUNTER — Encounter: Payer: Self-pay | Admitting: Family Medicine

## 2016-06-04 VITALS — BP 128/80 | Ht 62.0 in | Wt 149.0 lb

## 2016-06-04 DIAGNOSIS — G4700 Insomnia, unspecified: Secondary | ICD-10-CM

## 2016-06-04 DIAGNOSIS — IMO0001 Reserved for inherently not codable concepts without codable children: Secondary | ICD-10-CM

## 2016-06-04 DIAGNOSIS — F411 Generalized anxiety disorder: Secondary | ICD-10-CM | POA: Diagnosis not present

## 2016-06-04 DIAGNOSIS — R030 Elevated blood-pressure reading, without diagnosis of hypertension: Secondary | ICD-10-CM

## 2016-06-04 MED ORDER — CLONAZEPAM 0.5 MG PO TABS: ORAL_TABLET | ORAL | 2 refills | Status: DC

## 2016-06-04 NOTE — Progress Notes (Signed)
Subjective:    Patient ID: Samantha Wang, female    DOB: 09/30/1971, 45 y.o.   MRN: [id number]  Gastroesophageal Reflux  This is a chronic problem. Treatments tried: Protonix.   Patient states no other concerns this visit.  Pain flaring up some  BP good recenly  extrcising sev times per wk    ex still    Scope to do tsoon  Take s afew clon per wk  Has stopped the Wellbutrin. Feels overall doing well off the medication. Still uses clonazepam when necessary. Otherwise feeling good. Due to see a gastroenterologist soon for ulcer workup     Review of Systems No headache, no major weight loss or weight gain, no chest pain no back pain abdominal pain no change in bowel habits complete ROS otherwise negative     Objective:   Physical Exam   Alert vitals stable, NAD. Blood pressure good on repeat. HEENT normal. Lungs clear. Heart regular rate and rhythm.      Assessment & Plan:  Impression chronic anxiety depression clinically improved patient has stopped using the Wellbutrin. Uses only occasional clonazepam. Also having challenges with probable ulcer to endoscopy soon. Uses clonazepam when necessary. Check yearly at this point [facility]

## 2016-06-06 ENCOUNTER — Encounter (HOSPITAL_COMMUNITY)
Admission: RE | Disposition: A | Discharge status: Home / Self Care | Payer: Self-pay | Source: Ambulatory Visit | Attending: Internal Medicine

## 2016-06-06 ENCOUNTER — Encounter (HOSPITAL_COMMUNITY): Payer: Self-pay | Admitting: *Deleted

## 2016-06-06 ENCOUNTER — Ambulatory Visit (HOSPITAL_COMMUNITY)
Admission: RE | Admit: 2016-06-06 | Discharge: 2016-06-06 | Disposition: A | Discharge status: Home / Self Care | Payer: 59 | Source: Ambulatory Visit | Attending: Internal Medicine | Admitting: Internal Medicine

## 2016-06-06 DIAGNOSIS — Z79899 Other long term (current) drug therapy: Secondary | ICD-10-CM | POA: Diagnosis not present

## 2016-06-06 DIAGNOSIS — K228 Other specified diseases of esophagus: Secondary | ICD-10-CM

## 2016-06-06 DIAGNOSIS — Z7951 Long term (current) use of inhaled steroids: Secondary | ICD-10-CM | POA: Diagnosis not present

## 2016-06-06 DIAGNOSIS — Z8711 Personal history of peptic ulcer disease: Secondary | ICD-10-CM | POA: Insufficient documentation

## 2016-06-06 DIAGNOSIS — K449 Diaphragmatic hernia without obstruction or gangrene: Secondary | ICD-10-CM | POA: Diagnosis not present

## 2016-06-06 DIAGNOSIS — R1013 Epigastric pain: Secondary | ICD-10-CM | POA: Insufficient documentation

## 2016-06-06 DIAGNOSIS — K279 Peptic ulcer, site unspecified, unspecified as acute or chronic, without hemorrhage or perforation: Secondary | ICD-10-CM

## 2016-06-06 DIAGNOSIS — F419 Anxiety disorder, unspecified: Secondary | ICD-10-CM | POA: Diagnosis not present

## 2016-06-06 DIAGNOSIS — K259 Gastric ulcer, unspecified as acute or chronic, without hemorrhage or perforation: Secondary | ICD-10-CM | POA: Insufficient documentation

## 2016-06-06 DIAGNOSIS — K219 Gastro-esophageal reflux disease without esophagitis: Secondary | ICD-10-CM | POA: Diagnosis not present

## 2016-06-06 HISTORY — PX: ESOPHAGOGASTRODUODENOSCOPY: SHX5428

## 2016-06-06 SURGERY — EGD (ESOPHAGOGASTRODUODENOSCOPY)
Anesthesia: Moderate Sedation

## 2016-06-06 MED ORDER — MIDAZOLAM HCL 5 MG/5ML IJ SOLN
INTRAMUSCULAR | Status: AC
  Filled 2016-06-06: qty 10

## 2016-06-06 MED ORDER — STERILE WATER FOR IRRIGATION IR SOLN
Status: DC | PRN
  Administered 2016-06-06: 100 mL

## 2016-06-06 MED ORDER — BUTAMBEN-TETRACAINE-BENZOCAINE 2-2-14 % EX AERO
INHALATION_SPRAY | CUTANEOUS | Status: DC | PRN
  Administered 2016-06-06: 2 via TOPICAL

## 2016-06-06 MED ORDER — MIDAZOLAM HCL 5 MG/5ML IJ SOLN
INTRAMUSCULAR | Status: DC | PRN
  Administered 2016-06-06 (×7): 2 mg via INTRAVENOUS

## 2016-06-06 MED ORDER — SODIUM CHLORIDE 0.9 % IV SOLN
INTRAVENOUS | Status: DC
  Administered 2016-06-06: 14:00:00 via INTRAVENOUS

## 2016-06-06 MED ORDER — MEPERIDINE HCL 50 MG/ML IJ SOLN
INTRAMUSCULAR | Status: AC
  Filled 2016-06-06: qty 1

## 2016-06-06 MED ORDER — MIDAZOLAM HCL 5 MG/5ML IJ SOLN
INTRAMUSCULAR | Status: AC
  Filled 2016-06-06: qty 5

## 2016-06-06 MED ORDER — MEPERIDINE HCL 50 MG/ML IJ SOLN
INTRAMUSCULAR | Status: DC | PRN
  Administered 2016-06-06 (×3): 25 mg via INTRAVENOUS

## 2016-06-06 NOTE — H&P (Signed)
Samantha Wang is an 45 y.o. female.   Chief Complaint: Patient is here for EGD. HPI: She is 45 year old Caucasian female who is 5270 history of nonhealing peptic ulcer disease who underwent diagnostic EGD and December 2016 because of marked antral wall thickening and abdominal CT. She was found to have small and one large prepyloric ulcer. Biopsy revealed benign etiology like previous times. She's been maintained on pantoprazole twice daily. Lately she's been having intermittent epigastric pain at different times. She has had nausea but no vomiting melena or rectal bleeding. Her appetite is normal and she has not lost any weight. She does not take NSAIDs.  Past Medical History:  Diagnosis Date  . Allergy   . Anemia   . Anxiety   . Breast nodule    left   . GERD (gastroesophageal reflux disease)   . H/O cesarean section    history of 2 c-sections    . History of hiatal hernia   . Peptic ulcer disease   . Reflux     Past Surgical History:  Procedure Laterality Date  . CESAREAN SECTION  [id number]  . CESAREAN SECTION     x2  . ESOPHAGOGASTRODUODENOSCOPY N/A 10/11/2015   Procedure: ESOPHAGOGASTRODUODENOSCOPY (EGD);  Surgeon: Malissa HippoNajeeb U Sharona Rovner, MD;  Location: [facility];  Service: Endoscopy;  Laterality: N/A;  1230  . UPPER GASTROINTESTINAL ENDOSCOPY      Family History  Problem Relation Age of Onset  . Healthy Mother   . Hyperlipidemia Father   . Healthy Brother   . Healthy Son   . Healthy Daughter    Social History:  reports that she has never smoked. She has never used smokeless tobacco. She reports that she does not drink alcohol or use drugs.  Allergies: No Known Allergies  Medications Prior to Admission  Medication Sig Dispense Refill  . clonazePAM (KLONOPIN) 0.5 MG tablet 1/2 to 1 po BID prn anxiety 30 tablet 2  . Ferrous Sulfate (SLOW FE PO) Take 1 tablet by mouth 2 (two) times a week. Patient's states that she is taking twice a week.    . fluticasone (FLONASE) 50  MCG/ACT nasal spray USE TWO SPRAY(S) IN EACH NOSTRIL ONCE DAILY AS NEEDED FOR CONGESTION --  **NEEDS  OFFICE  VISIT** 16 g 5  . loratadine (CLARITIN) 10 MG tablet Take 10 mg by mouth daily.    . pantoprazole (PROTONIX) 40 MG tablet TAKE ONE TABLET BY MOUTH TWICE DAILY BEFORE  A  MEAL 60 tablet 5  . Vitamin D, Ergocalciferol, (DRISDOL) 50000 units CAPS capsule Take 50,000 Units by mouth every 7 (seven) days.      No results found for this or any previous visit (from the past 48 hour(s)). No results found.  ROS  Blood pressure 120/83, pulse 89, temperature 97.7 F (36.5 C), temperature source Oral, resp. rate 18, last menstrual period 05/31/2016, SpO2 100 %. Physical Exam  Constitutional: She appears well-developed and well-nourished.  HENT:  Mouth/Throat: Oropharynx is clear and moist.  Eyes: Conjunctivae are normal. No scleral icterus.  Neck: No thyromegaly present.  Cardiovascular: Normal rate, regular rhythm and normal heart sounds.   No murmur heard. Respiratory: Effort normal and breath sounds normal.  GI: Soft. She exhibits no distension and no mass. There is no tenderness.  Musculoskeletal: She exhibits no edema.  Lymphadenopathy:    She has no cervical adenopathy.  Neurological: She is alert.  Skin: Skin is warm and dry.     Assessment/Plan Refractory peptic ulcer disease.  Diagnostic EGD.  Lionel DecemberNajeeb Cierria Height, MD 06/06/2016, 3:04 PM

## 2016-06-06 NOTE — Discharge Instructions (Signed)
Esophagogastroduodenoscopy, Care After Refer to this sheet in the next few weeks. These instructions provide you with information about caring for yourself after your procedure. Your health care provider may also give you more specific instructions. Your treatment has been planned according to current medical practices, but problems sometimes occur. Call your health care provider if you have any problems or questions after your procedure. WHAT TO EXPECT AFTER THE PROCEDURE After your procedure, it is typical to feel:  Soreness in your throat.  Pain with swallowing.  Sick to your stomach (nauseous).  Bloated.  Dizzy.  Fatigued. HOME CARE INSTRUCTIONS  Do not eat or drink anything until the numbing medicine (local anesthetic) has worn off and your gag reflex has returned. You will know that the local anesthetic has worn off when you can swallow comfortably.  Do not drive or operate machinery until directed by your health care provider.  Take medicines only as directed by your health care provider. SEEK MEDICAL CARE IF:   You cannot stop coughing.  You are not urinating at all or less than usual. SEEK IMMEDIATE MEDICAL CARE IF:  You have difficulty swallowing.  You cannot eat or drink.  You have worsening throat or chest pain.  You have dizziness or lightheadedness or you faint.  You have nausea or vomiting.  You have chills.  You have a fever.  You have severe abdominal pain.  You have black, tarry, or bloody stools.   This information is not intended to replace advice given to you by your health care provider. Make sure you discuss any questions you have with your health care provider.   Document Released: 09/24/2012 Document Revised: 10/29/2014 Document Reviewed: 09/24/2012 Elsevier Interactive Patient Education [employer] Resume usual medications and low residue diet. No driving for 24 hours. Physician will call with biopsy results.         Low-Fiber Diet Fiber is found in fruits, vegetables, and whole grains. A low-fiber diet restricts fibrous foods that are not digested in the small intestine. A diet containing about 10-15 grams of fiber per day is considered low fiber. Low-fiber diets may be used to:  Promote healing and rest the bowel during intestinal flare-ups.  Prevent blockage of a partially obstructed or narrowed gastrointestinal tract.  Reduce fecal weight and volume.  Slow the movement of feces. You may be on a low-fiber diet as a transitional diet following surgery, after an injury (trauma), or because of a short (acute) or lifelong (chronic) illness. Your health care provider will determine the length of time you need to stay on this diet.  WHAT DO I NEED TO KNOW ABOUT A LOW-FIBER DIET? Always check the fiber content on the packaging's Nutrition Facts label, especially on foods from the grains list. Ask your dietitian if you have questions about specific foods that are related to your condition, especially if the food is not listed below. In general, a low-fiber food will have less than 2 g of fiber. WHAT FOODS CAN I EAT? Grains All breads and crackers made with white flour. Sweet rolls, doughnuts, waffles, pancakes, [place] toast, bagels. Pretzels, Melba toast, zwieback. Well-cooked cereals, such as cornmeal, farina, or cream cereals. Dry cereals that do not contain whole grains, fruit, or nuts, such as refined corn, wheat, rice, and oat cereals. Potatoes prepared any way without skins, plain pastas and noodles, refined white rice. Use white flour for baking and making sauces. Use allowed list of grains for casseroles, dumplings, and puddings.  Vegetables  Strained tomato and vegetable juices. Fresh lettuce, cucumber, spinach. Well-cooked (no skin or pulp) or canned vegetables, such as asparagus, bean sprouts, beets, carrots, green beans, mushrooms, potatoes, pumpkin, spinach, yellow squash, tomato  sauce/puree, turnips, yams, and zucchini. Keep servings limited to  cup.  Fruits All fruit juices except prune juice. Cooked or canned fruits without skin and seeds, such as applesauce, apricots, cherries, fruit cocktail, grapefruit, grapes, mandarin oranges, melons, peaches, pears, pineapple, and plums. Fresh fruits without skin, such as apricots, avocados, bananas, melons, pineapple, nectarines, and peaches. Keep servings limited to  cup or 1 piece.  Meat and Other Protein Sources Ground or well-cooked tender beef, ham, veal, lamb, pork, or poultry. Eggs, plain cheese. Fish, oysters, shrimp, lobster, and other seafood. Liver, organ meats. Smooth nut butters. Dairy All milk products and alternative dairy substitutes, such as soy, rice, almond, and coconut, not containing added whole nuts, seeds, or added fruit. Beverages Decaf coffee, fruit, and vegetable juices or smoothies (small amounts, with no pulp or skins, and with fruits from allowed list), sports drinks, herbal tea. Condiments Ketchup, mustard, vinegar, cream sauce, cheese sauce, cocoa powder. Spices in moderation, such as allspice, basil, bay leaves, celery powder or leaves, cinnamon, cumin powder, curry powder, ginger, mace, marjoram, onion or garlic powder, oregano, paprika, parsley flakes, ground pepper, rosemary, sage, savory, tarragon, thyme, and turmeric. Sweets and Desserts Plain cakes and cookies, pie made with allowed fruit, pudding, custard, cream pie. Gelatin, fruit, ice, sherbet, frozen ice pops. Ice cream, ice milk without nuts. Plain hard candy, honey, jelly, molasses, syrup, sugar, chocolate syrup, gumdrops, marshmallows. Limit overall sugar intake.  Fats and Oil Margarine, butter, cream, mayonnaise, salad oils, plain salad dressings made from allowed foods. Choose healthy fats such as olive oil, canola oil, and omega-3 fatty acids (such as found in salmon or tuna) when possible.  Other Bouillon, broth, or cream soups  made from allowed foods. Any strained soup. Casseroles or mixed dishes made with allowed foods. The items listed above may not be a complete list of recommended foods or beverages. Contact your dietitian for more options.  WHAT FOODS ARE NOT RECOMMENDED? Grains All whole wheat and whole grain breads and crackers. Multigrains, rye, bran seeds, nuts, or coconut. Cereals containing whole grains, multigrains, bran, coconut, nuts, raisins. Cooked or dry oatmeal, steel-cut oats. Coarse wheat cereals, granola. Cereals advertised as high fiber. Potato skins. Whole grain pasta, wild or brown rice. Popcorn. Coconut flour. Bran, buckwheat, corn bread, multigrains, rye, wheat germ.  Vegetables Fresh, cooked or canned vegetables, such as artichokes, asparagus, beet greens, broccoli, Brussels sprouts, cabbage, celery, cauliflower, corn, eggplant, kale, legumes or beans, okra, peas, and tomatoes. Avoid large servings of any vegetables, especially raw vegetables.  Fruits Fresh fruits, such as apples with or without skin, berries, cherries, figs, grapes, grapefruit, guavas, kiwis, mangoes, oranges, papayas, pears, persimmons, pineapple, and pomegranate. Prune juice and juices with pulp, stewed or dried prunes. Dried fruits, dates, raisins. Fruit seeds or skins. Avoid large servings of all fresh fruits. Meats and Other Protein Sources Tough, fibrous meats with gristle. Chunky nut butter. Cheese made with seeds, nuts, or other foods not recommended. Nuts, seeds, legumes (beans, including baked beans), dried peas, beans, lentils.  Dairy Yogurt or cheese that contains nuts, seeds, or added fruit.  Beverages Fruit juices with high pulp, prune juice. Caffeinated coffee and teas.  Condiments Coconut, maple syrup, pickles, olives. Sweets and Desserts Desserts, cookies, or candies that contain nuts or coconut, chunky peanut butter, dried fruits. Jams,  preserves with seeds, marmalade. Large amounts of sugar and sweets. Any  other dessert made with fruits from the not recommended list.  Other Soups made from vegetables that are not recommended or that contain other foods not recommended.  The items listed above may not be a complete list of foods and beverages to avoid. Contact your dietitian for more information.   This information is not intended to replace advice given to you by your health care provider. Make sure you discuss any questions you have with your health care provider.   Document Released: 03/30/2002 Document Revised: 10/13/2013 Document Reviewed: 08/31/2013 Elsevier Interactive Patient Education [employer] Inc[employer]

## 2016-06-06 NOTE — Op Note (Signed)
[facility] Patient Name: Samantha Wang Procedure Date: 06/06/2016 3:00 PM MRN: [id number] Date of Birth: [birth year] Attending MD: Lionel December , MD CSN: [id number] Age: 45 Admit Type: Outpatient Procedure:                Upper GI endoscopy Indications:              Diagnostic procedure Providers:                Lionel December, MD, Edrick Kins, RN, Lollie Marrow.                            Val Eagle, Technician Referring MD:             Vilinda Blanks. Gerda Diss, MD Medicines:                Cetacaine spray, Meperidine 75 mg IV, Midazolam 14                            mg IV Complications:            No immediate complications. Estimated Blood Loss:     Estimated blood loss was minimal. Procedure:                Pre-Anesthesia Assessment:                           - Prior to the procedure, a History and Physical                            was performed, and patient medications and                            allergies were reviewed. The patient's tolerance of                            previous anesthesia was also reviewed. The risks                            and benefits of the procedure and the sedation                            options and risks were discussed with the patient.                            All questions were answered, and informed consent                            was obtained. Prior Anticoagulants: The patient has                            taken no previous anticoagulant or antiplatelet                            agents. ASA Grade Assessment: II - A patient with  mild systemic disease. After reviewing the risks                            and benefits, the patient was deemed in                            satisfactory condition to undergo the procedure.                           After obtaining informed consent, the endoscope was                            passed under direct vision. Throughout the                            procedure, the  patient's blood pressure, pulse, and                            oxygen saturations were monitored continuously. The                            EG-[id number] ([id number]) scope was introduced through the                            mouth, and advanced to the prepyloric region,                            stomach. The upper GI endoscopy was accomplished                            without difficulty. Scope In: 3:20:16 PM Scope Out: 3:39:58 PM Total Procedure Duration: 0 hours 19 minutes 42 seconds  Findings:      The examined esophagus was normal.      The Z-line was irregular and was found 36 cm from the incisors.      A 2 cm hiatal hernia was present.      The cardia, gastric fundus, gastric body and gastric antrum were normal.      One non-bleeding cratered gastric ulcer with no stigmata of bleeding was       found in the prepyloric region of the stomach and at the pylorus.       appearance suggested underlying mass. The lesion was 30 mm in largest       dimension. Biopsies were taken with a cold forceps for histology.      An examination of the duodenum was not performed. Impression:               - Normal esophagus.                           - Z-line irregular, 36 cm from the incisors.                           - 2 cm hiatal hernia.                           -  Normal cardia, gastric fundus, gastric body and                            antrum.                           - Non-bleeding gastric ulcer with no stigmata of                            bleeding. appearance suggested underlying mass.                            Biopsied.                           - Scope could not be advanced into duodenum                            secondary to deformed and heart to see pylorus. Moderate Sedation:      Moderate (conscious) sedation was administered by the endoscopy nurse       and supervised by the endoscopist. The following parameters were       monitored: oxygen saturation, heart rate, blood pressure,  CO2       capnography and response to care. Total physician intraservice time was       30 minutes. Recommendation:           - Patient has a contact number available for                            emergencies. The signs and symptoms of potential                            delayed complications were discussed with the                            patient. Return to normal activities tomorrow.                            Written discharge instructions were provided to the                            patient.                           - Patient has a contact number available for                            emergencies. The signs and symptoms of potential                            delayed complications were discussed with the                            patient. Return to normal activities tomorrow.  Written discharge instructions were provided to the                            patient.                           - Low fiber diet today.                           - Continue present medications.                           - Await pathology results. Procedure Code(s):        --- Professional ---                           [phone], 52, Esophagogastroduodenoscopy, flexible,                            transoral; with biopsy, single or multiple                           99152, Moderate sedation services provided by the                            same physician or other qualified health care                            professional performing the diagnostic or                            therapeutic service that the sedation supports,                            requiring the presence of an independent trained                            observer to assist in the monitoring of the                            patient's level of consciousness and physiological                            status; initial 15 minutes of intraservice time,                            patient age 27 years or  older                           [phone], Moderate sedation services; each additional                            15 minutes intraservice time Diagnosis Code(s):        --- Professional ---  K22.8, Other specified diseases of esophagus                           K44.9, Diaphragmatic hernia without obstruction or                            gangrene                           K25.9, Gastric ulcer, unspecified as acute or                            chronic, without hemorrhage or perforation CPT copyright 2016 American [employer]. All rights reserved. The codes documented in this report are preliminary and upon coder review may  be revised to meet current compliance requirements. Lionel December, MD Lionel December, MD 06/06/2016 3:53:23 PM This report has been signed electronically. Number of Addenda: 0

## 2016-06-07 ENCOUNTER — Other Ambulatory Visit (INDEPENDENT_AMBULATORY_CARE_PROVIDER_SITE_OTHER): Payer: Self-pay | Admitting: Internal Medicine

## 2016-06-07 DIAGNOSIS — R109 Unspecified abdominal pain: Secondary | ICD-10-CM

## 2016-06-07 DIAGNOSIS — K311 Adult hypertrophic pyloric stenosis: Secondary | ICD-10-CM

## 2016-06-07 DIAGNOSIS — K259 Gastric ulcer, unspecified as acute or chronic, without hemorrhage or perforation: Secondary | ICD-10-CM

## 2016-06-07 DIAGNOSIS — R110 Nausea: Secondary | ICD-10-CM

## 2016-06-08 ENCOUNTER — Encounter (INDEPENDENT_AMBULATORY_CARE_PROVIDER_SITE_OTHER): Payer: Self-pay

## 2016-06-08 ENCOUNTER — Encounter (HOSPITAL_COMMUNITY): Payer: Self-pay | Admitting: Internal Medicine

## 2016-06-11 ENCOUNTER — Ambulatory Visit (HOSPITAL_COMMUNITY)
Admission: RE | Admit: 2016-06-11 | Discharge: 2016-06-11 | Disposition: A | Discharge status: Home / Self Care | Payer: 59 | Source: Ambulatory Visit | Attending: Internal Medicine | Admitting: Internal Medicine

## 2016-06-11 DIAGNOSIS — K449 Diaphragmatic hernia without obstruction or gangrene: Secondary | ICD-10-CM | POA: Insufficient documentation

## 2016-06-11 DIAGNOSIS — K311 Adult hypertrophic pyloric stenosis: Secondary | ICD-10-CM | POA: Insufficient documentation

## 2016-06-11 DIAGNOSIS — N6489 Other specified disorders of breast: Secondary | ICD-10-CM | POA: Diagnosis not present

## 2016-06-11 DIAGNOSIS — R110 Nausea: Secondary | ICD-10-CM | POA: Insufficient documentation

## 2016-06-11 DIAGNOSIS — R109 Unspecified abdominal pain: Secondary | ICD-10-CM | POA: Diagnosis not present

## 2016-06-11 DIAGNOSIS — K259 Gastric ulcer, unspecified as acute or chronic, without hemorrhage or perforation: Secondary | ICD-10-CM | POA: Insufficient documentation

## 2016-06-11 MED ORDER — IOPAMIDOL (ISOVUE-300) INJECTION 61%
100.0000 mL | Freq: Once | INTRAVENOUS | Status: AC | PRN
  Administered 2016-06-11: 100 mL via INTRAVENOUS

## 2016-07-11 ENCOUNTER — Telehealth (INDEPENDENT_AMBULATORY_CARE_PROVIDER_SITE_OTHER): Payer: Self-pay | Admitting: *Deleted

## 2016-07-11 NOTE — Telephone Encounter (Signed)
Patient called and wanted to see if her appointment on 07/17/2016,she still needs to come,as [facility] was [facility]   Per Dr.Rehman cancel this appointment , he wants to see patient after her surgery.  Patient called and made aware. She has had another bx. She will go this Friday to discuss plan of treatment.

## 2016-07-12 NOTE — Telephone Encounter (Signed)
I have cancelled the appointment for 07/17/16

## 2016-07-13 DIAGNOSIS — K253 Acute gastric ulcer without hemorrhage or perforation: Secondary | ICD-10-CM | POA: Insufficient documentation

## 2016-07-17 ENCOUNTER — Ambulatory Visit (INDEPENDENT_AMBULATORY_CARE_PROVIDER_SITE_OTHER): Payer: 59 | Admitting: Internal Medicine

## 2016-08-22 HISTORY — PX: OTHER SURGICAL HISTORY: SHX169

## 2016-11-19 ENCOUNTER — Other Ambulatory Visit: Payer: Self-pay | Admitting: Certified Nurse Midwife

## 2016-11-19 DIAGNOSIS — Z1231 Encounter for screening mammogram for malignant neoplasm of breast: Secondary | ICD-10-CM

## 2016-11-27 ENCOUNTER — Encounter: Payer: Self-pay | Admitting: Internal Medicine

## 2016-12-18 ENCOUNTER — Ambulatory Visit
Admission: RE | Admit: 2016-12-18 | Discharge: 2016-12-18 | Disposition: A | Discharge status: Home / Self Care | Payer: 59 | Source: Ambulatory Visit | Attending: Certified Nurse Midwife | Admitting: Certified Nurse Midwife

## 2016-12-18 DIAGNOSIS — Z1231 Encounter for screening mammogram for malignant neoplasm of breast: Secondary | ICD-10-CM | POA: Diagnosis not present

## 2016-12-20 IMAGING — DX DG CERVICAL SPINE COMPLETE 4+V
5 series · 5 of 5 positions shown · non-contrast
Comparison: None.

CLINICAL DATA: Neck pain.  Headache

EXAM:
CERVICAL SPINE  4+ VIEWS

[c-spine lat]
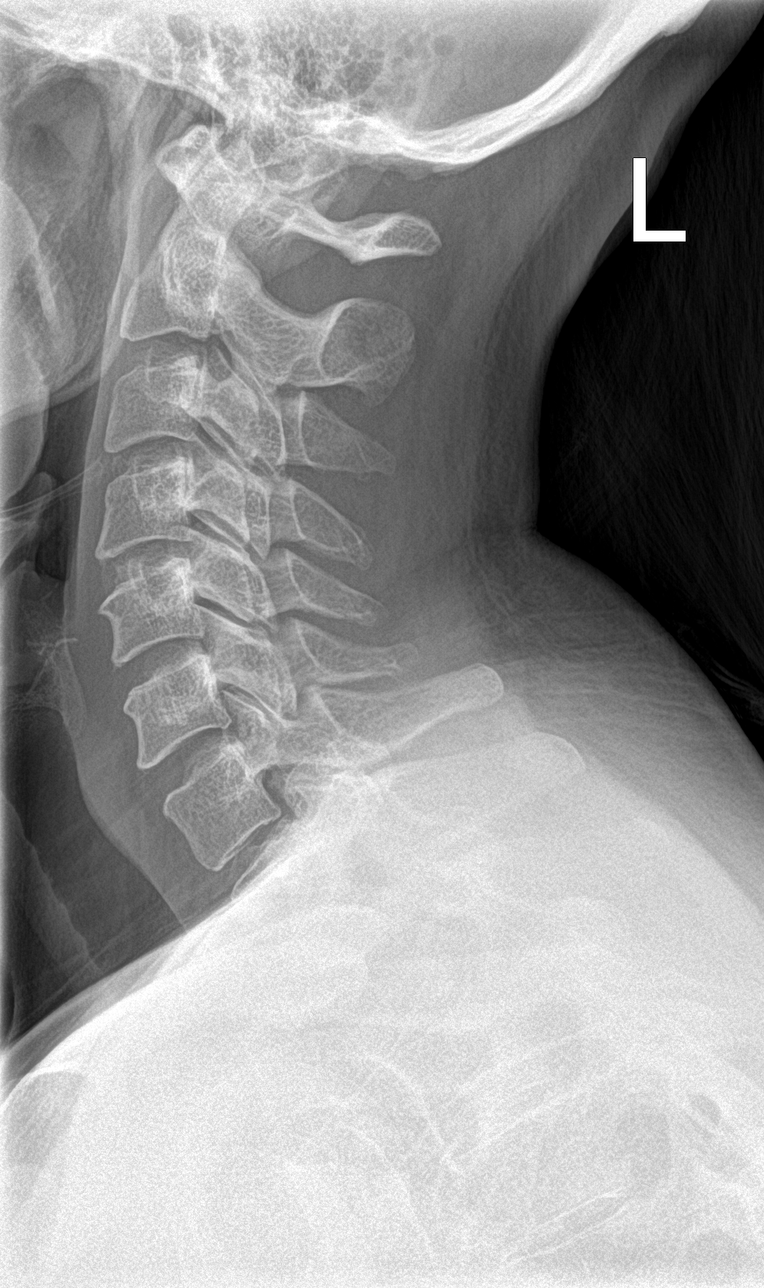

[c-spine obl (1 of 2)]
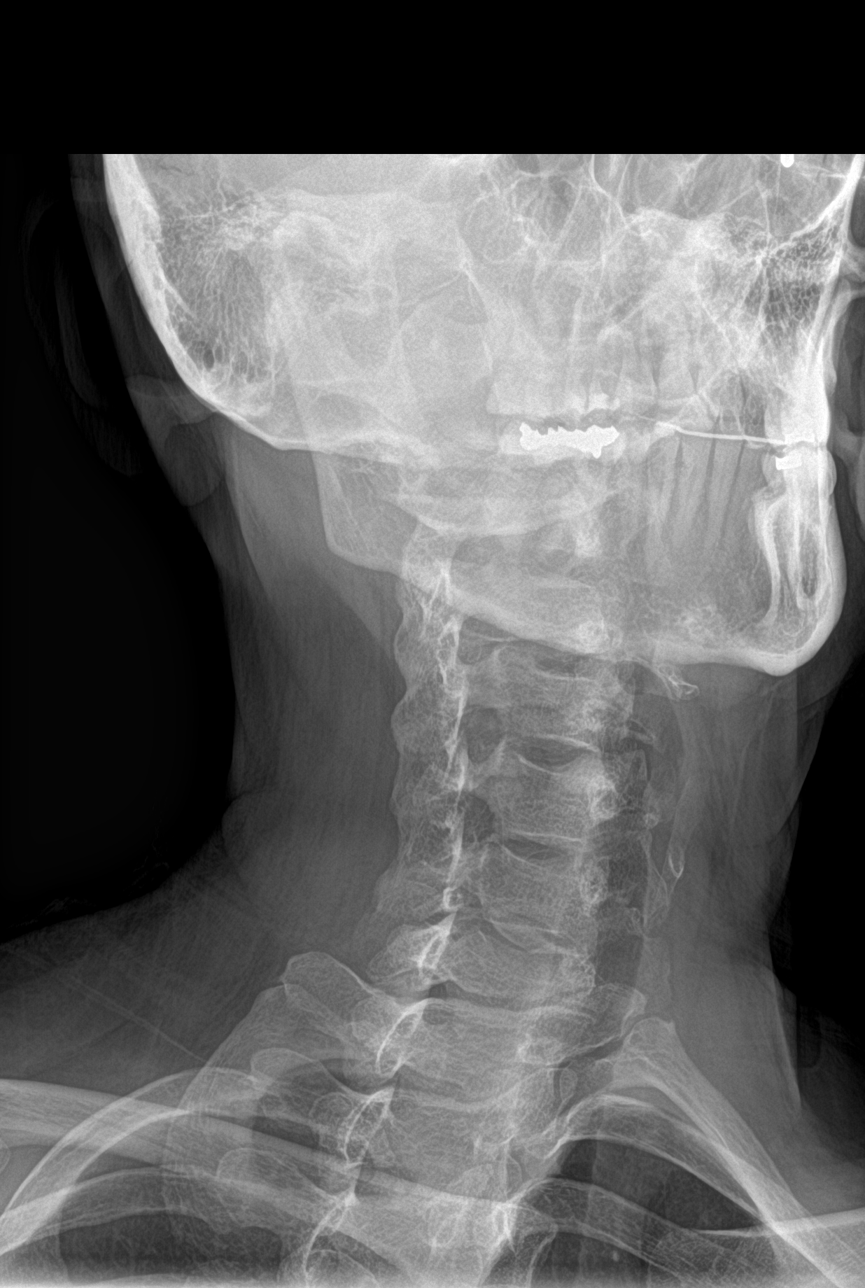

[c-spine obl (2 of 2)]
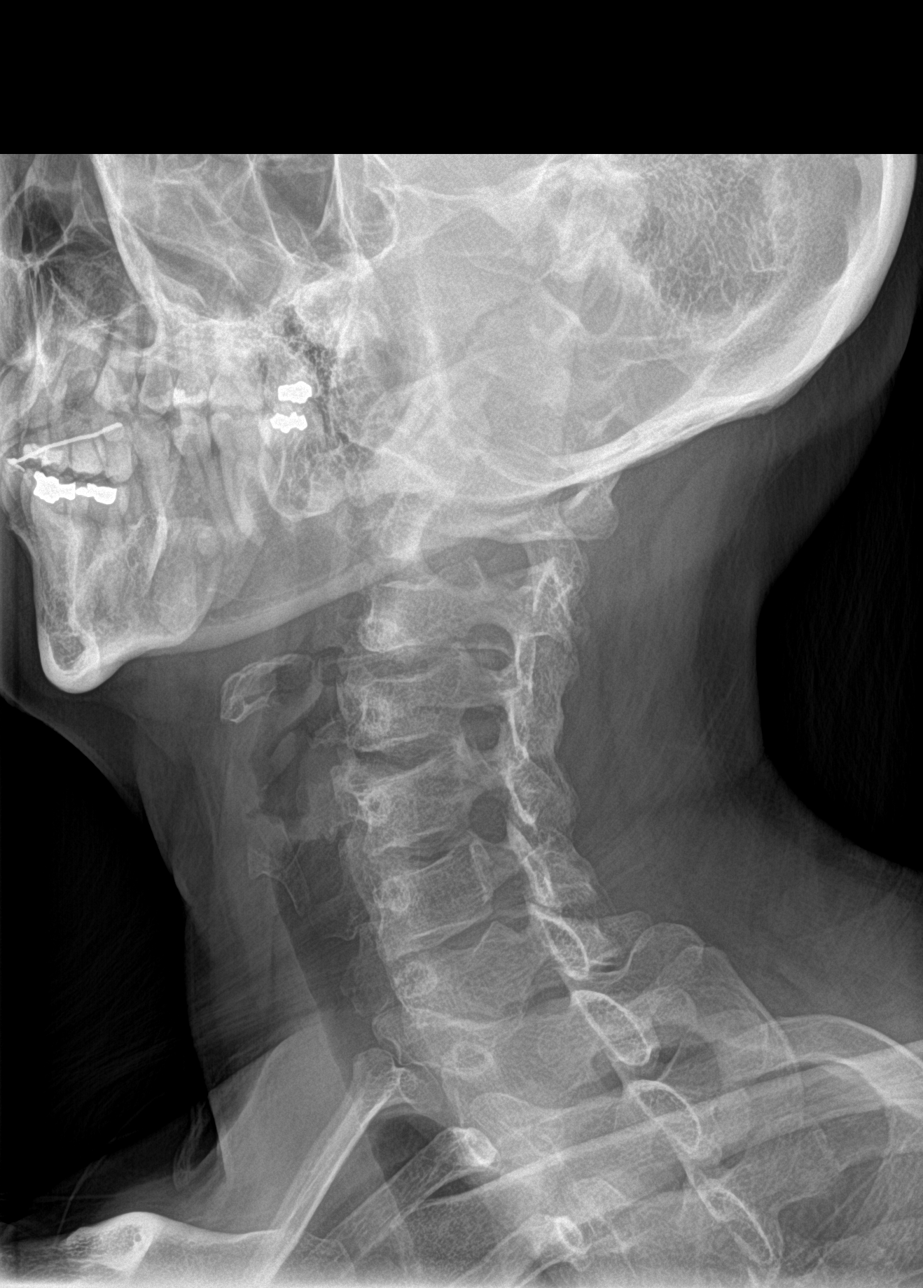

[c-spine ap]
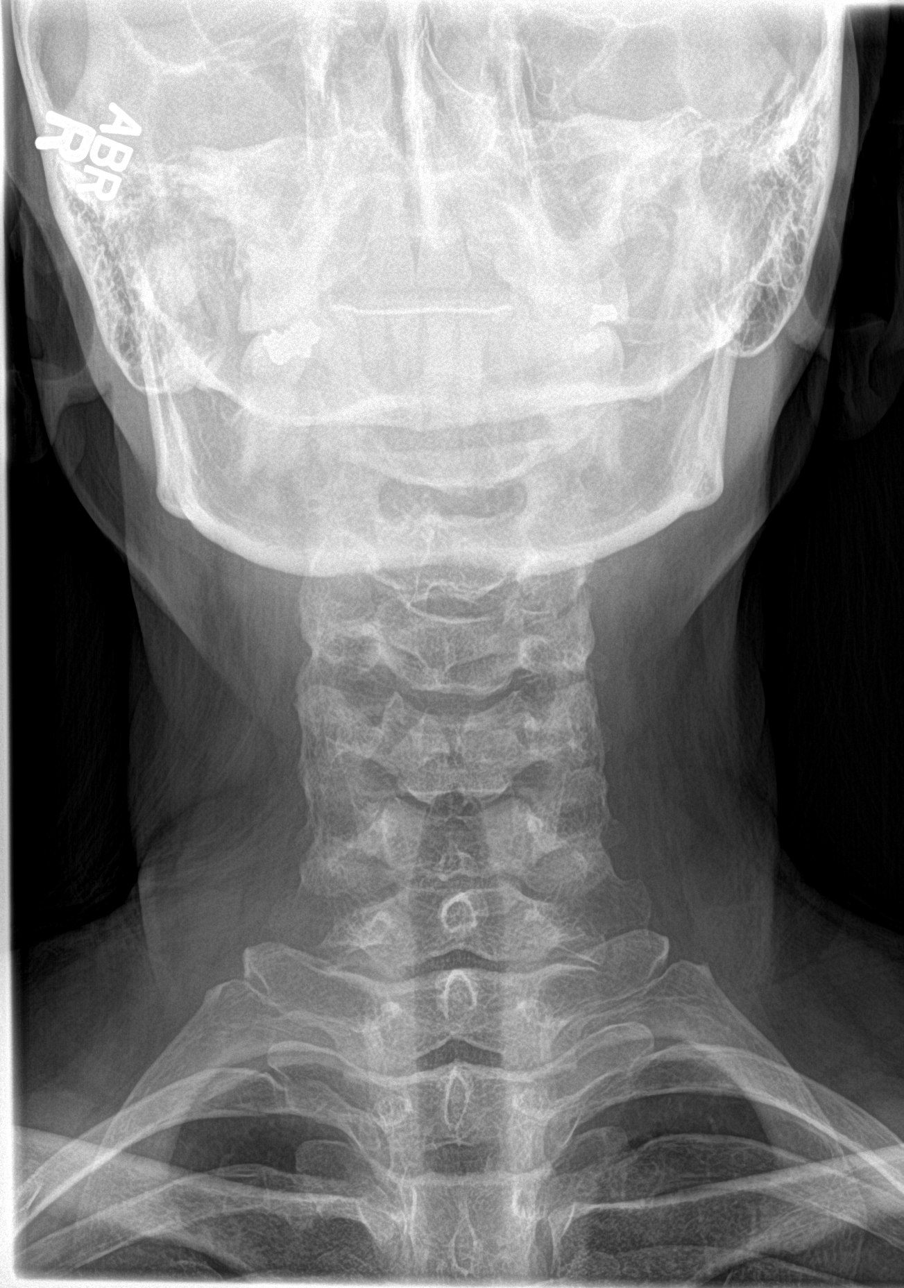

[c-spine open mouth]
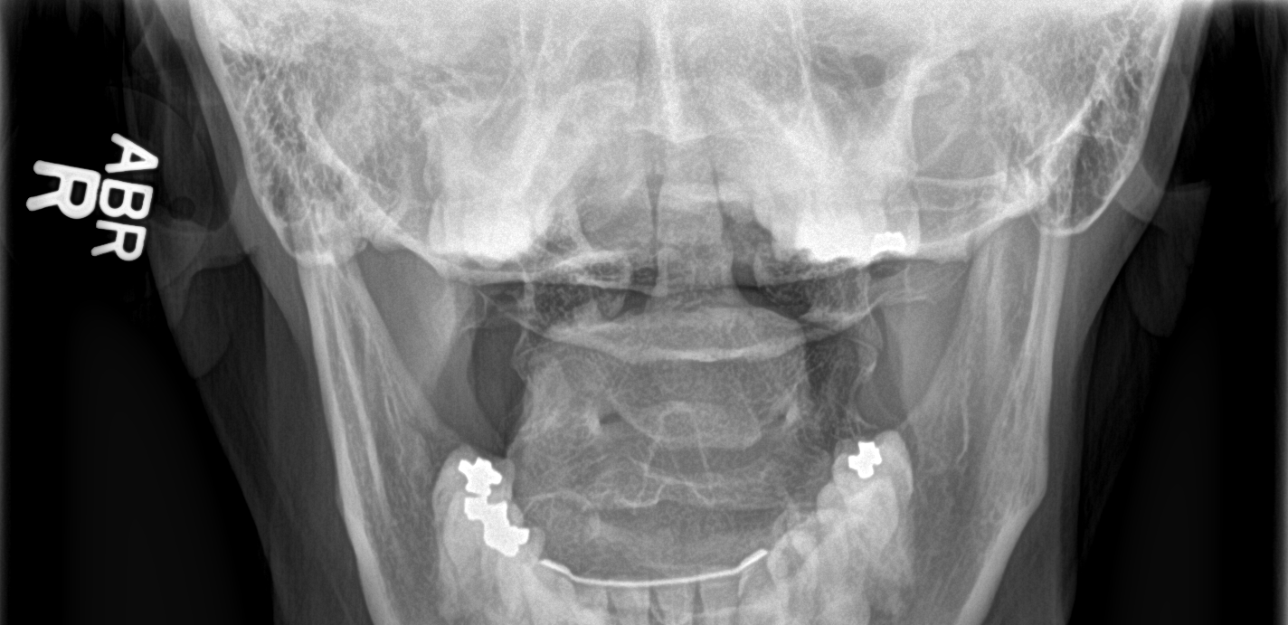

[5 of 5 positions shown; findings below may reference images not displayed]

FINDINGS: There is no evidence of cervical spine fracture or prevertebral soft
tissue swelling. Alignment is normal. No other significant bone
abnormalities are identified.
IMPRESSION: Negative cervical spine radiographs.

## 2016-12-24 ENCOUNTER — Ambulatory Visit (INDEPENDENT_AMBULATORY_CARE_PROVIDER_SITE_OTHER): Payer: 59 | Admitting: Family Medicine

## 2016-12-24 ENCOUNTER — Encounter: Payer: Self-pay | Admitting: Family Medicine

## 2016-12-24 VITALS — BP 122/82 | Ht 62.0 in | Wt 143.4 lb

## 2016-12-24 DIAGNOSIS — J0110 Acute frontal sinusitis, unspecified: Secondary | ICD-10-CM | POA: Diagnosis not present

## 2016-12-24 MED ORDER — AMOXICILLIN 400 MG/5ML PO SUSR: ORAL | 0 refills | Status: DC

## 2016-12-24 NOTE — Progress Notes (Signed)
Subjective:    Patient ID: Samantha Wang, female    DOB: 08-07-1971, 46 y.o.   MRN: [id number]  Sinusitis  This is a new problem. The current episode started in the past 7 days. Associated symptoms include congestion, coughing, ear pain, headaches and a sore throat. Past treatments include oral decongestants.   Started one wk ago  No fever  drevoled hking cough   Not going sey    Worsening eith frontal headache  bilat ptessure  Off and on  No sig high fever    Using prn mucinex decpong an styenol ;;   Review of Systems  HENT: Positive for congestion, ear pain and sore throat.   Respiratory: Positive for cough.   Neurological: Positive for headaches.       Objective:   Physical Exam Alert, mild malaise. Hydration good Vitals stable. frontal/ maxillary tenderness evident positive nasal congestion. pharynx normal neck supple  lungs clear/no crackles or wheezes. heart regular in rhythm        Assessment & Plan:  Impression rhinosinusitis likely post viral, discussed with patient. plan antibiotics prescribed. Questions answered. Symptomatic care discussed. warning signs discussed. WSL

## 2017-01-30 ENCOUNTER — Telehealth (INDEPENDENT_AMBULATORY_CARE_PROVIDER_SITE_OTHER): Payer: Self-pay | Admitting: *Deleted

## 2017-01-30 NOTE — Telephone Encounter (Signed)
Appointment given to the patient for 02/04/17 at 9:30am per Dr. Karilyn Cota.  Patient was contacted by phone.

## 2017-01-30 NOTE — Telephone Encounter (Signed)
Patient called and states that she is having IBS/Diarrhea and Incision tenderness to sometime painful. Her surgeon has released her and advised her that any problems she has she should follow up with Dr.Rehman.  She wants to be seen as the symptoms are not getting better.  Talked with Dr.Rehman - he ask that she be seen on Monday-02/04/2017.

## 2017-02-04 ENCOUNTER — Other Ambulatory Visit (INDEPENDENT_AMBULATORY_CARE_PROVIDER_SITE_OTHER): Payer: Self-pay | Admitting: *Deleted

## 2017-02-04 ENCOUNTER — Encounter (INDEPENDENT_AMBULATORY_CARE_PROVIDER_SITE_OTHER): Payer: Self-pay | Admitting: Internal Medicine

## 2017-02-04 ENCOUNTER — Ambulatory Visit (INDEPENDENT_AMBULATORY_CARE_PROVIDER_SITE_OTHER): Payer: 59 | Admitting: Internal Medicine

## 2017-02-04 VITALS — BP 120/82 | HR 72 | Temp 98.3°F | Resp 18 | Ht 62.0 in | Wt 143.5 lb

## 2017-02-04 DIAGNOSIS — D508 Other iron deficiency anemias: Secondary | ICD-10-CM

## 2017-02-04 DIAGNOSIS — R109 Unspecified abdominal pain: Secondary | ICD-10-CM | POA: Diagnosis not present

## 2017-02-04 MED ORDER — DICYCLOMINE HCL 10 MG PO CAPS: 10.0000 mg | ORAL_CAPSULE | Freq: Two times a day (BID) | ORAL | 5 refills | Status: DC

## 2017-02-04 NOTE — Progress Notes (Signed)
Presenting complaint;  Abdominal pain. History of iron deficiency aKaruna  Database and Subjective:  Samantha Wang is 46 year old Caucasian female who has several year history of nonhealing gastric ulcer. She never tested positive for H. pylori and biopsies were also negative for Crohn's disease. EGD in August 2017 revealed large antral ulcer with appearance of underlying mass and deformed pylorus. Duodenum could not be examined. She was referred to Dr. Flonnie Hailstone of [facility]. She underwent antrectomy and truncal vagotomy in November 2017. Postop course was uneventful and she has been released by Dr. Flonnie Hailstone.  Patient complains of soreness in the region of abdominal scar. This pain seemed to get worse with physical activity. She has not noted any ulcer or drainage from her scar. This pain is mild. He also complains of midabdominal cramping after meals. This symptom occurs at least 2-3 times a week. She states this symptom makes her very nervous and she has been using Klonopin more often than in the past. Cramping may last for several minutes and is not associated with nausea vomiting or diarrhea. She states she lost 15 pounds following her gastric surgery but she has gained 7 back. She is having 1-2 stools per day. Stool consistency varies from loose to formed. She denies melena or rectal bleeding. She denies chest pain heartburn or dysphagia. She has been off PPI since her surgery. She tells me she was having issues with bile reflux soon after surgery but not anymore. She is taking iron pill no more than once a week.   Current Medications: Outpatient Encounter Prescriptions as of 02/04/2017  Medication Sig  . clonazePAM (KLONOPIN) 0.5 MG tablet 1/2 to 1 po BID prn anxiety  . Ferrous Sulfate (SLOW FE PO) Take 1 tablet by mouth. Patient's states that she is taking once a week.  . fluticasone (FLONASE) 50 MCG/ACT nasal spray USE TWO SPRAY(S) IN EACH NOSTRIL ONCE DAILY AS NEEDED FOR CONGESTION --  **NEEDS  OFFICE   VISIT**  . loratadine (CLARITIN) 10 MG tablet Take 10 mg by mouth daily.  . [DISCONTINUED] amoxicillin (AMOXIL) 400 MG/5ML suspension Two tspns bid for ten days (Patient not taking: Reported on 02/04/2017)  . [DISCONTINUED] pantoprazole (PROTONIX) 40 MG tablet TAKE ONE TABLET BY MOUTH TWICE DAILY BEFORE  A  MEAL (Patient not taking: Reported on 02/04/2017)  . [DISCONTINUED] Vitamin D, Ergocalciferol, (DRISDOL) 50000 units CAPS capsule Take 50,000 Units by mouth every 7 (seven) days.   No facility-administered encounter medications on file as of 02/04/2017.      Objective: Blood pressure 120/82, pulse 72, temperature 98.3 F (36.8 C), temperature source Oral, resp. rate 18, height  (1.575 m), weight 143 lb 8 oz (65.1 kg). Patient is alert and in no acute distress. Conjunctiva is pink. Sclera is nonicteric Oropharyngeal mucosa is normal. No neck masses or thyromegaly noted. Cardiac exam with regular rhythm normal S1 and S2. No murmur or gallop noted. Lungs are clear to auscultation. Abdomen symmetrical with upper midline scar. There is is slightly raised area in the middle of scar which is tender but no fluctuation noted. Bowel sounds are normal. On palpation abdomen is soft. Mild tenderness noted below the right costal margin but no organomegaly or masses. No LE edema or clubbing noted.  Labs/studies Results: Lab data from 08/28/2016  H&H 8.7 and 27.0.   Assessment:  #1. Abdominal pain. He appears to have 2 different kinds of pain. Superficial pain appears to be due to scar and she may have in addition to the suture. This  pain should eventually resolve. Second pain is postprandial and possibly manifestation of dumping syndrome he went though she does not have all the typical symptoms. Doubt that she has post vagotomy gastroparesis. If she does not respond to anti-spondylotic therapy will need further workup. #2. History of iron deficiency anemia. Hemoglobin over 5 months ago was  8.7.   Plan:  Dicyclomine 10 mg by mouth twice a day before meals. Patient will keep symptom and food diary for the next 12 weeks. She will go the lab for CBC serum iron TIBC and ferritin levels. Office visit in 3 months.

## 2017-02-04 NOTE — Patient Instructions (Signed)
Take dicyclomine 30 minutes before lunch and evening meal or before breakfast and evening meal. Keep symptom diary as discussed until next office visit. Physician will call with results of blood tests.

## 2017-02-05 ENCOUNTER — Other Ambulatory Visit (INDEPENDENT_AMBULATORY_CARE_PROVIDER_SITE_OTHER): Payer: Self-pay | Admitting: *Deleted

## 2017-02-05 DIAGNOSIS — D649 Anemia, unspecified: Secondary | ICD-10-CM

## 2017-02-05 DIAGNOSIS — K279 Peptic ulcer, site unspecified, unspecified as acute or chronic, without hemorrhage or perforation: Secondary | ICD-10-CM

## 2017-02-05 LAB — CBC
HEMATOCRIT: 31 % — AB (ref 34.0–46.6)
HEMOGLOBIN: 9 g/dL — AB (ref 11.1–15.9)
MCH: 21.6 pg — ABNORMAL LOW (ref 26.6–33.0)
MCHC: 29 g/dL — AB (ref 31.5–35.7)
MCV: 75 fL — ABNORMAL LOW (ref 79–97)
Platelets: 375 10*3/uL (ref 150–379)
RBC: 4.16 x10E6/uL (ref 3.77–5.28)
RDW: 17.1 % — ABNORMAL HIGH (ref 12.3–15.4)
WBC: 5.6 10*3/uL (ref 3.4–10.8)

## 2017-02-05 LAB — IRON AND TIBC
IRON SATURATION: 3 % — AB (ref 15–55)
IRON: 12 ug/dL — AB (ref 27–159)
Total Iron Binding Capacity: 423 ug/dL (ref 250–450)
UIBC: 411 ug/dL (ref 131–425)

## 2017-02-05 LAB — FERRITIN: FERRITIN: 4 ng/mL — AB (ref 15–150)

## 2017-02-15 ENCOUNTER — Other Ambulatory Visit (INDEPENDENT_AMBULATORY_CARE_PROVIDER_SITE_OTHER): Payer: Self-pay | Admitting: *Deleted

## 2017-02-15 ENCOUNTER — Encounter (INDEPENDENT_AMBULATORY_CARE_PROVIDER_SITE_OTHER): Payer: Self-pay | Admitting: *Deleted

## 2017-02-15 DIAGNOSIS — D649 Anemia, unspecified: Secondary | ICD-10-CM

## 2017-02-15 DIAGNOSIS — K279 Peptic ulcer, site unspecified, unspecified as acute or chronic, without hemorrhage or perforation: Secondary | ICD-10-CM

## 2017-02-20 ENCOUNTER — Ambulatory Visit (INDEPENDENT_AMBULATORY_CARE_PROVIDER_SITE_OTHER): Payer: 59 | Admitting: Family Medicine

## 2017-02-20 ENCOUNTER — Encounter: Payer: Self-pay | Admitting: Family Medicine

## 2017-02-20 VITALS — BP 122/82 | Ht 62.0 in | Wt 143.8 lb

## 2017-02-20 DIAGNOSIS — F411 Generalized anxiety disorder: Secondary | ICD-10-CM | POA: Diagnosis not present

## 2017-02-20 DIAGNOSIS — F5101 Primary insomnia: Secondary | ICD-10-CM | POA: Diagnosis not present

## 2017-02-20 MED ORDER — CLONAZEPAM 0.5 MG PO TABS: ORAL_TABLET | ORAL | 5 refills | Status: DC

## 2017-02-20 NOTE — Progress Notes (Signed)
Subjective:    Patient ID: Samantha Wang, female    DOB: [birth year], 46 y.o.   MRN: [id number]  HPI  Patient arrives for a follow up on anxiety. Patient has no problems or concerns.  More cahallenges with stomach symtoms and anxiety   Work wide opern and crazy  Takes a half clonazepam nearly evry day  Overall medications still definitely helps patient's anxiety.   Review of Systems No headache, no major weight loss or weight gain, no chest pain no back pain abdominal pain no change in bowel habits complete ROS otherwise negative     Objective:   Physical Exam  Alert vitals stable, NAD. Blood pressure good on repeat. HEENT normal. Lungs clear. Heart regular rate and rhythm.       Assessment & Plan:  Impression chronic anxiety good control meds no bad side effects to maintain same diet exercise discussed check every 6 months

## 2017-03-07 ENCOUNTER — Other Ambulatory Visit (INDEPENDENT_AMBULATORY_CARE_PROVIDER_SITE_OTHER): Payer: Self-pay | Admitting: *Deleted

## 2017-03-07 DIAGNOSIS — D508 Other iron deficiency anemias: Secondary | ICD-10-CM

## 2017-03-07 DIAGNOSIS — K279 Peptic ulcer, site unspecified, unspecified as acute or chronic, without hemorrhage or perforation: Secondary | ICD-10-CM

## 2017-03-08 DIAGNOSIS — D508 Other iron deficiency anemias: Secondary | ICD-10-CM | POA: Diagnosis not present

## 2017-03-08 DIAGNOSIS — K279 Peptic ulcer, site unspecified, unspecified as acute or chronic, without hemorrhage or perforation: Secondary | ICD-10-CM | POA: Diagnosis not present

## 2017-03-09 LAB — HEMOGLOBIN AND HEMATOCRIT, BLOOD
HEMATOCRIT: 30.7 % — AB (ref 34.0–46.6)
HEMOGLOBIN: 9.2 g/dL — AB (ref 11.1–15.9)

## 2017-03-12 ENCOUNTER — Ambulatory Visit (INDEPENDENT_AMBULATORY_CARE_PROVIDER_SITE_OTHER): Payer: 59 | Admitting: Certified Nurse Midwife

## 2017-03-12 ENCOUNTER — Encounter: Payer: Self-pay | Admitting: Certified Nurse Midwife

## 2017-03-12 ENCOUNTER — Other Ambulatory Visit (INDEPENDENT_AMBULATORY_CARE_PROVIDER_SITE_OTHER): Payer: Self-pay | Admitting: *Deleted

## 2017-03-12 VITALS — BP 110/74 | HR 72 | Resp 12 | Ht 63.0 in | Wt 144.0 lb

## 2017-03-12 DIAGNOSIS — Z0000 Encounter for general adult medical examination without abnormal findings: Secondary | ICD-10-CM | POA: Diagnosis not present

## 2017-03-12 DIAGNOSIS — Z01419 Encounter for gynecological examination (general) (routine) without abnormal findings: Secondary | ICD-10-CM

## 2017-03-12 DIAGNOSIS — K279 Peptic ulcer, site unspecified, unspecified as acute or chronic, without hemorrhage or perforation: Secondary | ICD-10-CM

## 2017-03-12 LAB — COMPREHENSIVE METABOLIC PANEL
ALBUMIN: 4.1 g/dL (ref 3.6–5.1)
ALK PHOS: 40 U/L (ref 33–115)
ALT: 6 U/L (ref 6–29)
AST: 12 U/L (ref 10–35)
BUN: 11 mg/dL (ref 7–25)
CALCIUM: 8.7 mg/dL (ref 8.6–10.2)
CO2: 24 mmol/L (ref 20–31)
Chloride: 106 mmol/L (ref 98–110)
Creat: 0.73 mg/dL (ref 0.50–1.10)
GLUCOSE: 82 mg/dL (ref 65–99)
POTASSIUM: 3.9 mmol/L (ref 3.5–5.3)
Sodium: 139 mmol/L (ref 135–146)
TOTAL PROTEIN: 6.9 g/dL (ref 6.1–8.1)
Total Bilirubin: 0.3 mg/dL (ref 0.2–1.2)

## 2017-03-12 LAB — LIPID PANEL
CHOL/HDL RATIO: 2.2 ratio (ref <5.0)
Cholesterol: 145 mg/dL (ref <200)
HDL: 65 mg/dL (ref >50)
LDL Cholesterol: 66 mg/dL (ref <100)
Triglycerides: 70 mg/dL (ref <150)
VLDL: 14 mg/dL (ref <30)

## 2017-03-12 NOTE — Progress Notes (Signed)
46 y.o. [id number] Married  Caucasian Fe here for annual exam. Periods changing occasional missed period in the past year and when she had ulcer surgery. Change in amount at times, but no issues. Feels so much better after stomach ulcers removed. GI still monitoring anemia, was recently done, no results yet. Desires screening labs for insurance at work. No other health concerns today. Planning a vacation soon.   Patient's last menstrual period was 03/06/2017.          Sexually active: Yes.    The current method of family planning is vasectomy.    Exercising: Yes.    walking Smoker:  no  Health Maintenance: Pap:  01-11-14 neg, 03-08-16 neg HPV HR neg History of Abnormal Pap: no MMG:  12-18-16 category c density birads 1:neg Self Breast exams: occ Colonoscopy:  none BMD:   2016 TDaP:  2011 Shingles: no Pneumonia: no Hep C and HIV: Hep c neg 2017 Labs: yes   reports that she has never smoked. She has never used smokeless tobacco. She reports that she drinks about 1.2 oz of alcohol per week . She reports that she does not use drugs.  Past Medical History:  Diagnosis Date  . Allergy   . Anemia   . Anxiety   . Breast nodule    left   . GERD (gastroesophageal reflux disease)   . H/O cesarean section    history of 2 c-sections    . History of hiatal hernia   . Peptic ulcer disease   . Reflux     Past Surgical History:  Procedure Laterality Date  . CESAREAN SECTION  [id number]  . CESAREAN SECTION     x2  . ESOPHAGOGASTRODUODENOSCOPY N/A 10/11/2015   Procedure: ESOPHAGOGASTRODUODENOSCOPY (EGD);  Surgeon: Malissa HippoNajeeb U Rehman, MD;  Location: [facility];  Service: Endoscopy;  Laterality: N/A;  1230  . ESOPHAGOGASTRODUODENOSCOPY N/A 06/06/2016   Procedure: ESOPHAGOGASTRODUODENOSCOPY (EGD);  Surgeon: Malissa HippoNajeeb U Rehman, MD;  Location: [facility];  Service: Endoscopy;  Laterality: N/A;  255  . ulcer surgery  08/2016   abdominal  . UPPER GASTROINTESTINAL ENDOSCOPY      Current Outpatient  Prescriptions  Medication Sig Dispense Refill  . clonazePAM (KLONOPIN) 0.5 MG tablet 1/2 to 1 po BID prn anxiety 30 tablet 5  . dicyclomine (BENTYL) 10 MG capsule Take 1 capsule (10 mg total) by mouth 2 (two) times daily before lunch and supper. 60 capsule 5  . Ferrous Sulfate (SLOW FE PO) Take 1 tablet by mouth. Patient's states that she is taking once a week.    . fluticasone (FLONASE) 50 MCG/ACT nasal spray USE TWO SPRAY(S) IN EACH NOSTRIL ONCE DAILY AS NEEDED FOR CONGESTION --  **NEEDS  OFFICE  VISIT** 16 g 5  . loratadine (CLARITIN) 10 MG tablet Take 10 mg by mouth daily.     No current facility-administered medications for this visit.     Family History  Problem Relation Age of Onset  . Healthy Mother   . Hyperlipidemia Father   . Healthy Brother   . Healthy Son   . Healthy Daughter   . Breast cancer Maternal Grandmother     ROS:  Pertinent items are noted in HPI.  Otherwise, a comprehensive ROS was negative.  Exam:   BP 110/74   Pulse 72   Resp 12   Ht 5\' 3"  (1.6 m)   Wt 144 lb (65.3 kg)   LMP 03/06/2017   BMI 25.51 kg/m  Height: 5\' 3"  (160  cm) Ht Readings from Last 3 Encounters:  03/12/17 5\' 3"  (1.6 m)  02/20/17 5\' 2"  (1.575 m)  02/04/17 5\' 2"  (1.575 m)    General appearance: alert, cooperative and appears stated age Head: Normocephalic, without obvious abnormality, atraumatic Neck: no adenopathy, supple, symmetrical, trachea midline and thyroid normal to inspection and palpation Lungs: clear to auscultation bilaterally Breasts: normal appearance, no masses or tenderness, No nipple retraction or dimpling, No nipple discharge or bleeding, No axillary or supraclavicular adenopathy Heart: regular rate and rhythm Abdomen: soft, non-tender; no masses,  no organomegaly Extremities: extremities normal, atraumatic, no cyanosis or edema Skin: Skin color, texture, turgor normal. No rashes or lesions Lymph nodes: Cervical, supraclavicular, and axillary nodes normal. No  abnormal inguinal nodes palpated Neurologic: Grossly normal   Pelvic: External genitalia:  no lesions              Urethra:  normal appearing urethra with no masses, tenderness or lesions              Bartholin's and Skene's: normal                 Vagina: normal appearing vagina with normal color and discharge, no lesions              Cervix: no cervical motion tenderness, no lesions and normal appearance              Pap taken: No. Bimanual Exam:  Uterus:  normal size, contour, position, consistency, mobility, non-tender              Adnexa: normal adnexa and no mass, fullness, tenderness               Rectovaginal: Confirms               Anus:  normal sphincter tone, no lesions  Chaperone present: yes  A:  Well Woman with normal exam  Contraception  Spouse vasectomy  Menstrual cycle changes  Recent stomach ulcer surgery, still under follow up for anemia  Family history of breast cancer MGM  Screening labs  P:   Reviewed health and wellness pertinent to exam  Discussed importance of keeping menses calendar and to notify if no period in 3 months, or increase bleeding with periods. Given menses calendar to keep. Warning signs reviewed with cycle changes.  Continue follow up with GI as indicated  Labs: TSH,Lipid panel, Vitamin D, CMP  Pap smear: no    Reviewed importance of SBE, mammogram yearly, exercise, diet , calcium and Vitamin D intake.  Rv aex, prn  An After Visit Summary was printed and given to the patient.

## 2017-03-12 NOTE — Patient Instructions (Signed)

## 2017-03-13 LAB — VITAMIN D 25 HYDROXY (VIT D DEFICIENCY, FRACTURES): Vit D, 25-Hydroxy: 29 ng/mL — ABNORMAL LOW (ref 30–100)

## 2017-03-13 LAB — TSH: TSH: 1.57 mIU/L

## 2017-03-14 ENCOUNTER — Encounter (INDEPENDENT_AMBULATORY_CARE_PROVIDER_SITE_OTHER): Payer: Self-pay | Admitting: *Deleted

## 2017-03-14 ENCOUNTER — Other Ambulatory Visit (INDEPENDENT_AMBULATORY_CARE_PROVIDER_SITE_OTHER): Payer: Self-pay | Admitting: *Deleted

## 2017-03-14 DIAGNOSIS — K279 Peptic ulcer, site unspecified, unspecified as acute or chronic, without hemorrhage or perforation: Secondary | ICD-10-CM

## 2017-04-05 DIAGNOSIS — K279 Peptic ulcer, site unspecified, unspecified as acute or chronic, without hemorrhage or perforation: Secondary | ICD-10-CM | POA: Diagnosis not present

## 2017-04-06 LAB — HEMOGLOBIN AND HEMATOCRIT, BLOOD
HEMOGLOBIN: 10 g/dL — AB (ref 11.1–15.9)
Hematocrit: 34.5 % (ref 34.0–46.6)

## 2017-04-08 ENCOUNTER — Other Ambulatory Visit (INDEPENDENT_AMBULATORY_CARE_PROVIDER_SITE_OTHER): Payer: Self-pay | Admitting: *Deleted

## 2017-04-08 DIAGNOSIS — K279 Peptic ulcer, site unspecified, unspecified as acute or chronic, without hemorrhage or perforation: Secondary | ICD-10-CM

## 2017-04-17 ENCOUNTER — Encounter (INDEPENDENT_AMBULATORY_CARE_PROVIDER_SITE_OTHER): Payer: Self-pay | Admitting: *Deleted

## 2017-04-17 ENCOUNTER — Other Ambulatory Visit (INDEPENDENT_AMBULATORY_CARE_PROVIDER_SITE_OTHER): Payer: Self-pay | Admitting: *Deleted

## 2017-04-17 DIAGNOSIS — K279 Peptic ulcer, site unspecified, unspecified as acute or chronic, without hemorrhage or perforation: Secondary | ICD-10-CM

## 2017-04-23 ENCOUNTER — Ambulatory Visit (INDEPENDENT_AMBULATORY_CARE_PROVIDER_SITE_OTHER): Payer: 59 | Admitting: Internal Medicine

## 2017-05-02 DIAGNOSIS — K279 Peptic ulcer, site unspecified, unspecified as acute or chronic, without hemorrhage or perforation: Secondary | ICD-10-CM | POA: Diagnosis not present

## 2017-05-03 LAB — CBC
Hematocrit: 34.4 % (ref 34.0–46.6)
Hemoglobin: 10.4 g/dL — ABNORMAL LOW (ref 11.1–15.9)
MCH: 23.5 pg — AB (ref 26.6–33.0)
MCHC: 30.2 g/dL — AB (ref 31.5–35.7)
MCV: 78 fL — AB (ref 79–97)
PLATELETS: 332 10*3/uL (ref 150–379)
RBC: 4.42 x10E6/uL (ref 3.77–5.28)
RDW: 18.7 % — ABNORMAL HIGH (ref 12.3–15.4)
WBC: 5.4 10*3/uL (ref 3.4–10.8)

## 2017-05-07 ENCOUNTER — Ambulatory Visit (INDEPENDENT_AMBULATORY_CARE_PROVIDER_SITE_OTHER): Payer: 59 | Admitting: Internal Medicine

## 2017-05-07 ENCOUNTER — Encounter (INDEPENDENT_AMBULATORY_CARE_PROVIDER_SITE_OTHER): Payer: Self-pay | Admitting: Internal Medicine

## 2017-05-07 VITALS — BP 109/86 | HR 88 | Resp 16 | Ht 62.0 in | Wt 141.1 lb

## 2017-05-07 DIAGNOSIS — D508 Other iron deficiency anemias: Secondary | ICD-10-CM

## 2017-05-07 DIAGNOSIS — K580 Irritable bowel syndrome with diarrhea: Secondary | ICD-10-CM

## 2017-05-07 MED ORDER — RIFAXIMIN 550 MG PO TABS: 550.0000 mg | ORAL_TABLET | Freq: Three times a day (TID) | ORAL | 0 refills | Status: DC

## 2017-05-07 MED ORDER — DICYCLOMINE HCL 10 MG PO CAPS: 10.0000 mg | ORAL_CAPSULE | Freq: Three times a day (TID) | ORAL | 5 refills | Status: DC

## 2017-05-07 NOTE — Progress Notes (Signed)
Presenting complaint;  Follow-up for abdominal pain diarrhea and anemia.  Subjective:  Patient is 46 year old Caucasian female who is here for scheduled visit. She was last seen on 02/04/2017. She has lost another 3 pounds. She does not feel well. She remains with irregular bowel movements. She has anywhere from 0-6 per day. She says she has not had a formed or normal stools since her gastric surgery in November 2017. She had antrectomy and truncal vagotomy. She does not have a good appetite. When she does have a good appetite she is afraid to eat because it triggers or cramping and urgency. She does not take NSAIDs. She is trying to walk daily. She says she has a lot of stress pertaining to her work. She is being asked to do more and more every day. She states her symptoms are affecting quality of her life and her family is not very happy because she refuses to go to eat or participate in social activities.  Current Medications: Outpatient Encounter Prescriptions as of 05/07/2017  Medication Sig  . clonazePAM (KLONOPIN) 0.5 MG tablet 1/2 to 1 po BID prn anxiety  . dicyclomine (BENTYL) 10 MG capsule Take 1 capsule (10 mg total) by mouth 2 (two) times daily before lunch and supper.  . flintstones complete (FLINTSTONES) 60 MG chewable tablet Chew by mouth daily.  . fluticasone (FLONASE) 50 MCG/ACT nasal spray USE TWO SPRAY(S) IN EACH NOSTRIL ONCE DAILY AS NEEDED FOR CONGESTION --  **NEEDS  OFFICE  VISIT**  . loratadine (CLARITIN) 10 MG tablet Take 10 mg by mouth daily.  . [DISCONTINUED] Ferrous Sulfate (SLOW FE PO) Take 1 tablet by mouth. Patient's states that she is taking once a week.   No facility-administered encounter medications on file as of 05/07/2017.      Objective: Blood pressure 109/86, pulse 88, resp. rate 16, height 5\' 2"  (1.575 m), weight 141 lb 1.6 oz (64 kg). A is alert and in no acute distress. Conjunctiva is pink. Sclera is nonicteric Oropharyngeal mucosa is normal. No  neck masses or thyromegaly noted. Cardiac exam with regular rhythm normal S1 and S2. No murmur or gallop noted. Lungs are clear to auscultation. Abdomen is symmetrical. Bowel sounds are normal. On palpation abdomen is soft and nontender without organomegaly or masses. No LE edema or clubbing noted.  Labs/studies Results: Lab data from 05/02/2017  WBC 5.4, H&H 10.4 and 34.4 and platelet count 332K  MCV 78.   H&H was 10 and 34.5 on 04/05/2017.    Assessment:  #1. Iron deficiency anemia. H&H is slowly coming up with by mouth iron. She is only tolerating low-dose iron. Etiology felt to be impaired iron absorption and possibly secondary to loss from peptic ulcer disease and she hasn't fully recovered since surgery.  #2. Irritable bowel syndrome. She may also with dumping syndrome but she has not responded to antacid osmotic therapy. Unpredictable symptoms appear to affect quality of her life. She may also have small intestinal bacterial overgrowth. If she does not respond to therapy would consider further evaluation.   Plan:  Increase dicyclomine to 10 mg before each meal. Xifaxan 550 mg by mouth 3 times a day for 2 weeks. Patient will call with progress report in 2 weeks when she finishes Xifaxan. If she does not feel any better will proceed with EGD and colonoscopy. Office visit in 6 months.

## 2017-05-07 NOTE — Patient Instructions (Addendum)
Please call office with progress report when you finish Xifaxan.

## 2017-05-16 ENCOUNTER — Encounter (INDEPENDENT_AMBULATORY_CARE_PROVIDER_SITE_OTHER): Payer: Self-pay | Admitting: Internal Medicine

## 2017-05-28 ENCOUNTER — Telehealth (INDEPENDENT_AMBULATORY_CARE_PROVIDER_SITE_OTHER): Payer: Self-pay | Admitting: *Deleted

## 2017-05-28 NOTE — Telephone Encounter (Signed)
Forwarded to Dr.Rehman. 

## 2017-05-28 NOTE — Telephone Encounter (Signed)
Call returned. She will try Imodium 2 mg before breakfast and lunch in addition to dicyclomine 3 times a day. Will proceed with diagnostic EGD and colonoscopy.

## 2017-05-28 NOTE — Telephone Encounter (Signed)
Patient called into the office today . She states that Dr.Rehman had put her on antibiotic for loose stools. There was some improvement , it did help some, she had some solid stool. The first week some episodes of loose stool , then midway she had a episode. After being off the Xifaxan for 1 week she had problems this morning and was unable to go to work.  Her call back number is [phone].

## 2017-05-29 ENCOUNTER — Other Ambulatory Visit (INDEPENDENT_AMBULATORY_CARE_PROVIDER_SITE_OTHER): Payer: Self-pay | Admitting: *Deleted

## 2017-05-29 ENCOUNTER — Encounter (INDEPENDENT_AMBULATORY_CARE_PROVIDER_SITE_OTHER): Payer: Self-pay | Admitting: *Deleted

## 2017-05-29 ENCOUNTER — Telehealth (INDEPENDENT_AMBULATORY_CARE_PROVIDER_SITE_OTHER): Payer: Self-pay | Admitting: *Deleted

## 2017-05-29 DIAGNOSIS — R197 Diarrhea, unspecified: Secondary | ICD-10-CM | POA: Insufficient documentation

## 2017-05-29 DIAGNOSIS — D508 Other iron deficiency anemias: Secondary | ICD-10-CM

## 2017-05-29 DIAGNOSIS — R1084 Generalized abdominal pain: Secondary | ICD-10-CM | POA: Insufficient documentation

## 2017-05-29 DIAGNOSIS — D649 Anemia, unspecified: Secondary | ICD-10-CM | POA: Insufficient documentation

## 2017-05-29 MED ORDER — PEG 3350-KCL-NA BICARB-NACL 420 G PO SOLR: 4000.0000 mL | Freq: Once | ORAL | 0 refills | Status: AC

## 2017-05-29 NOTE — Telephone Encounter (Signed)
Patient needs trilyte 

## 2017-05-29 NOTE — Telephone Encounter (Signed)
This was addressed earlier today.

## 2017-05-29 NOTE — Telephone Encounter (Signed)
Patient needs trilyte This encounter was created in error - please disregard. 

## 2017-05-29 NOTE — Telephone Encounter (Signed)
TCS/EGD sch'd 07/19/17 MyChart message sent to patient with appt info and instructions, also will mail instrucitons

## 2017-07-19 ENCOUNTER — Ambulatory Visit (HOSPITAL_COMMUNITY)
Admission: RE | Admit: 2017-07-19 | Discharge: 2017-07-19 | Disposition: A | Discharge status: Home / Self Care | Payer: 59 | Source: Ambulatory Visit | Attending: Internal Medicine | Admitting: Internal Medicine

## 2017-07-19 ENCOUNTER — Encounter (HOSPITAL_COMMUNITY): Payer: Self-pay | Admitting: *Deleted

## 2017-07-19 ENCOUNTER — Encounter (HOSPITAL_COMMUNITY)
Admission: RE | Disposition: A | Discharge status: Home / Self Care | Payer: Self-pay | Source: Ambulatory Visit | Attending: Internal Medicine

## 2017-07-19 DIAGNOSIS — Z8711 Personal history of peptic ulcer disease: Secondary | ICD-10-CM | POA: Insufficient documentation

## 2017-07-19 DIAGNOSIS — K644 Residual hemorrhoidal skin tags: Secondary | ICD-10-CM | POA: Insufficient documentation

## 2017-07-19 DIAGNOSIS — D508 Other iron deficiency anemias: Secondary | ICD-10-CM

## 2017-07-19 DIAGNOSIS — Z538 Procedure and treatment not carried out for other reasons: Secondary | ICD-10-CM | POA: Insufficient documentation

## 2017-07-19 DIAGNOSIS — K219 Gastro-esophageal reflux disease without esophagitis: Secondary | ICD-10-CM | POA: Insufficient documentation

## 2017-07-19 DIAGNOSIS — Z903 Acquired absence of stomach [part of]: Secondary | ICD-10-CM | POA: Insufficient documentation

## 2017-07-19 DIAGNOSIS — D649 Anemia, unspecified: Secondary | ICD-10-CM | POA: Insufficient documentation

## 2017-07-19 DIAGNOSIS — R109 Unspecified abdominal pain: Secondary | ICD-10-CM | POA: Diagnosis not present

## 2017-07-19 DIAGNOSIS — Z803 Family history of malignant neoplasm of breast: Secondary | ICD-10-CM | POA: Insufficient documentation

## 2017-07-19 DIAGNOSIS — R1084 Generalized abdominal pain: Secondary | ICD-10-CM | POA: Insufficient documentation

## 2017-07-19 DIAGNOSIS — F419 Anxiety disorder, unspecified: Secondary | ICD-10-CM | POA: Diagnosis not present

## 2017-07-19 DIAGNOSIS — K228 Other specified diseases of esophagus: Secondary | ICD-10-CM | POA: Diagnosis not present

## 2017-07-19 DIAGNOSIS — K6289 Other specified diseases of anus and rectum: Secondary | ICD-10-CM | POA: Diagnosis not present

## 2017-07-19 DIAGNOSIS — R197 Diarrhea, unspecified: Secondary | ICD-10-CM | POA: Insufficient documentation

## 2017-07-19 DIAGNOSIS — K449 Diaphragmatic hernia without obstruction or gangrene: Secondary | ICD-10-CM | POA: Diagnosis not present

## 2017-07-19 DIAGNOSIS — D509 Iron deficiency anemia, unspecified: Secondary | ICD-10-CM | POA: Insufficient documentation

## 2017-07-19 HISTORY — PX: BIOPSY: SHX5522

## 2017-07-19 HISTORY — PX: COLONOSCOPY: SHX5424

## 2017-07-19 HISTORY — PX: ESOPHAGOGASTRODUODENOSCOPY: SHX5428

## 2017-07-19 HISTORY — DX: Other specified postprocedural states: Z98.890

## 2017-07-19 HISTORY — DX: Other specified postprocedural states: R11.2

## 2017-07-19 LAB — HEMOGLOBIN AND HEMATOCRIT, BLOOD
HEMATOCRIT: 36.2 % (ref 36.0–46.0)
HEMOGLOBIN: 11.4 g/dL — AB (ref 12.0–15.0)

## 2017-07-19 SURGERY — EGD (ESOPHAGOGASTRODUODENOSCOPY)
Anesthesia: Moderate Sedation

## 2017-07-19 MED ORDER — MEPERIDINE HCL 50 MG/ML IJ SOLN
INTRAMUSCULAR | Status: AC
  Filled 2017-07-19: qty 1

## 2017-07-19 MED ORDER — MIDAZOLAM HCL 5 MG/5ML IJ SOLN
INTRAMUSCULAR | Status: AC
  Filled 2017-07-19: qty 10

## 2017-07-19 MED ORDER — SODIUM CHLORIDE 0.9% FLUSH
INTRAVENOUS | Status: AC
  Filled 2017-07-19: qty 10

## 2017-07-19 MED ORDER — PROMETHAZINE HCL 25 MG/ML IJ SOLN
INTRAMUSCULAR | Status: AC
  Filled 2017-07-19: qty 1

## 2017-07-19 MED ORDER — LIDOCAINE VISCOUS 2 % MT SOLN
OROMUCOSAL | Status: AC
  Filled 2017-07-19: qty 15

## 2017-07-19 MED ORDER — PROMETHAZINE HCL 25 MG/ML IJ SOLN
INTRAMUSCULAR | Status: DC | PRN
  Administered 2017-07-19 (×2): 6.25 mg via INTRAVENOUS

## 2017-07-19 MED ORDER — MEPERIDINE HCL 50 MG/ML IJ SOLN
INTRAMUSCULAR | Status: DC | PRN
  Administered 2017-07-19 (×4): 25 mg via INTRAVENOUS

## 2017-07-19 MED ORDER — MIDAZOLAM HCL 5 MG/5ML IJ SOLN
INTRAMUSCULAR | Status: DC | PRN
  Administered 2017-07-19 (×3): 2 mg via INTRAVENOUS
  Administered 2017-07-19: 1 mg via INTRAVENOUS
  Administered 2017-07-19 (×3): 2 mg via INTRAVENOUS

## 2017-07-19 MED ORDER — MIDAZOLAM HCL 5 MG/5ML IJ SOLN
INTRAMUSCULAR | Status: AC
  Filled 2017-07-19: qty 5

## 2017-07-19 MED ORDER — SODIUM CHLORIDE 0.9 % IV SOLN
INTRAVENOUS | Status: DC
  Administered 2017-07-19: 1000 mL via INTRAVENOUS

## 2017-07-19 MED ORDER — LIDOCAINE VISCOUS 2 % MT SOLN
OROMUCOSAL | Status: DC | PRN
Start: 2017-07-19 — End: 2017-07-19
  Administered 2017-07-19: 4 mL via OROMUCOSAL

## 2017-07-19 NOTE — Discharge Instructions (Signed)
Resume usual medications including [place]stone chewable with iron as before. Resume usual diet. No driving for 24 hours. Physician will call with biopsy results.   Colonoscopy, Adult, Care After This sheet gives you information about how to care for yourself after your procedure. Your doctor may also give you more specific instructions. If you have problems or questions, call your doctor. Follow these instructions at home: General instructions   For the first 24 hours after the procedure: ? Do not drive or use machinery. ? Do not sign important documents. ? Do not drink alcohol. ? Do your daily activities more slowly than normal. ? Eat foods that are soft and easy to digest. ? Rest often.  Take over-the-counter or prescription medicines only as told by your doctor.  It is up to you to get the results of your procedure. Ask your doctor, or the department performing the procedure, when your results will be ready. To help cramping and bloating:  Try walking around.  Put heat on your belly (abdomen) as told by your doctor. Use a heat source that your doctor recommends, such as a moist heat pack or a heating pad. ? Put a towel between your skin and the heat source. ? Leave the heat on for 20-30 minutes. ? Remove the heat if your skin turns bright red. This is especially important if you cannot feel pain, heat, or cold. You can get burned. Eating and drinking  Drink enough fluid to keep your pee (urine) clear or pale yellow.  Return to your normal diet as told by your doctor. Avoid heavy or fried foods that are hard to digest.  Avoid drinking alcohol for as long as told by your doctor. Contact a doctor if:  You have blood in your poop (stool) 2-3 days after the procedure. Get help right away if:  You have more than a small amount of blood in your poop.  You see large clumps of tissue (blood clots) in your poop.  Your belly is swollen.  You feel sick to your stomach  (nauseous).  You throw up (vomit).  You have a fever.  You have belly pain that gets worse, and medicine does not help your pain. This information is not intended to replace advice given to you by your health care provider. Make sure you discuss any questions you have with your health care provider. Document Released: 11/10/2010 Document Revised: 07/02/2016 Document Reviewed: 07/02/2016 Elsevier Interactive Patient Education  2017 [employer]  Hemorrhoids Hemorrhoids are swollen veins in and around the rectum or anus. There are two types of hemorrhoids:  Internal hemorrhoids. These occur in the veins that are just inside the rectum. They may poke through to the outside and become irritated and painful.  External hemorrhoids. These occur in the veins that are outside of the anus and can be felt as a painful swelling or hard lump near the anus.  Most hemorrhoids do not cause serious problems, and they can be managed with home treatments such as diet and lifestyle changes. If home treatments do not help your symptoms, procedures can be done to shrink or remove the hemorrhoids. What are the causes? This condition is caused by increased pressure in the anal area. This pressure may result from various things, including:  Constipation.  Straining to have a bowel movement.  Diarrhea.  Pregnancy.  Obesity.  Sitting for long periods of time.  Heavy lifting or other activity that causes you to strain.  Anal sex.  What are the  signs or symptoms? Symptoms of this condition include:  Pain.  Anal itching or irritation.  Rectal bleeding.  Leakage of stool (feces).  Anal swelling.  One or more lumps around the anus.  How is this diagnosed? This condition can often be diagnosed through a visual exam. Other exams or tests may also be done, such as:  Examination of the rectal area with a gloved hand (digital rectal exam).  Examination of the anal canal using a small tube  (anoscope).  A blood test, if you have lost a significant amount of blood.  A test to look inside the colon (sigmoidoscopy or colonoscopy).  How is this treated? This condition can usually be treated at home. However, various procedures may be done if dietary changes, lifestyle changes, and other home treatments do not help your symptoms. These procedures can help make the hemorrhoids smaller or remove them completely. Some of these procedures involve surgery, and others do not. Common procedures include:  Rubber band ligation. Rubber bands are placed at the base of the hemorrhoids to cut off the blood supply to them.  Sclerotherapy. Medicine is injected into the hemorrhoids to shrink them.  Infrared coagulation. A type of light energy is used to get rid of the hemorrhoids.  Hemorrhoidectomy surgery. The hemorrhoids are surgically removed, and the veins that supply them are tied off.  Stapled hemorrhoidopexy surgery. A circular stapling device is used to remove the hemorrhoids and use staples to cut off the blood supply to them.  Follow these instructions at home: Eating and drinking  Eat foods that have a lot of fiber in them, such as whole grains, beans, nuts, fruits, and vegetables. Ask your health care provider about taking products that have added fiber (fiber supplements).  Drink enough fluid to keep your urine clear or pale yellow. Managing pain and swelling  Take warm sitz baths for 20 minutes, 3-4 times a day to ease pain and discomfort.  If directed, apply ice to the affected area. Using ice packs between sitz baths may be helpful. ? Put ice in a plastic bag. ? Place a towel between your skin and the bag. ? Leave the ice on for 20 minutes, 2-3 times a day. General instructions  Take over-the-counter and prescription medicines only as told by your health care provider.  Use medicated creams or suppositories as told.  Exercise regularly.  Go to the bathroom when you  have the urge to have a bowel movement. Do not wait.  Avoid straining to have bowel movements.  Keep the anal area dry and clean. Use wet toilet paper or moist towelettes after a bowel movement.  Do not sit on the toilet for long periods of time. This increases blood pooling and pain. Contact a health care provider if:  You have increasing pain and swelling that are not controlled by treatment or medicine.  You have uncontrolled bleeding.  You have difficulty having a bowel movement, or you are unable to have a bowel movement.  You have pain or inflammation outside the area of the hemorrhoids. This information is not intended to replace advice given to you by your health care provider. Make sure you discuss any questions you have with your health care provider. Document Released: 10/05/2000 Document Revised: 03/07/2016 Document Reviewed: 06/22/2015 Elsevier Interactive Patient Education  2017 [employer]  Upper Endoscopy, Care After Refer to this sheet in the next few weeks. These instructions provide you with information about caring for yourself after your procedure. Your health  care provider may also give you more specific instructions. Your treatment has been planned according to current medical practices, but problems sometimes occur. Call your health care provider if you have any problems or questions after your procedure. What can I expect after the procedure? After the procedure, it is common to have:  A sore throat.  Bloating.  Nausea.  Follow these instructions at home:  Follow instructions from your health care provider about what to eat or drink after your procedure.  Return to your normal activities as told by your health care provider. Ask your health care provider what activities are safe for you.  Take over-the-counter and prescription medicines only as told by your health care provider.  Do not drive for 24 hours if you received a sedative.  Keep all  follow-up visits as told by your health care provider. This is important. Contact a health care provider if:  You have a sore throat that lasts longer than one day.  You have trouble swallowing. Get help right away if:  You have a fever.  You vomit blood or your vomit looks like coffee grounds.  You have bloody, black, or tarry stools.  You have a severe sore throat or you cannot swallow.  You have difficulty breathing.  You have severe pain in your chest or belly. This information is not intended to replace advice given to you by your health care provider. Make sure you discuss any questions you have with your health care provider. Document Released: 04/08/2012 Document Revised: 03/15/2016 Document Reviewed: 07/21/2015 Elsevier Interactive Patient Education  2017 [employer]  Hiatal Hernia A hiatal hernia occurs when part of the stomach slides above the muscle that separates the abdomen from the chest (diaphragm). A person can be born with a hiatal hernia (congenital), or it may develop over time. In almost all cases of hiatal hernia, only the top part of the stomach pushes through the diaphragm. Many people have a hiatal hernia with no symptoms. The larger the hernia, the more likely it is that you will have symptoms. In some cases, a hiatal hernia allows stomach acid to flow back into the tube that carries food from your mouth to your stomach (esophagus). This may cause heartburn symptoms. Severe heartburn symptoms may mean that you have developed a condition called gastroesophageal reflux disease (GERD). What are the causes? This condition is caused by a weakness in the opening (hiatus) where the esophagus passes through the diaphragm to attach to the upper part of the stomach. A person may be born with a weakness in the hiatus, or a weakness can develop over time. What increases the risk? This condition is more likely to develop in:  Older people. Age is a major risk factor for  a hiatal hernia, especially if you are over the age of 51.  Pregnant women.  People who are overweight.  People who have frequent constipation.  What are the signs or symptoms? Symptoms of this condition usually develop in the form of GERD symptoms. Symptoms include:  Heartburn.  Belching.  Indigestion.  Trouble swallowing.  Coughing or wheezing.  Sore throat.  Hoarseness.  Chest pain.  Nausea and vomiting.  How is this diagnosed? This condition may be diagnosed during testing for GERD. Tests that may be done include:  X-rays of your stomach or chest.  An upper gastrointestinal (GI) series. This is an X-ray exam of your GI tract that is taken after you swallow a chalky liquid that shows up clearly  on the X-ray.  Endoscopy. This is a procedure to look into your stomach using a thin, flexible tube that has a tiny camera and light on the end of it.  How is this treated? This condition may be treated by:  Dietary and lifestyle changes to help reduce GERD symptoms.  Medicines. These may include: ? Over-the-counter antacids. ? Medicines that make your stomach empty more quickly. ? Medicines that block the production of stomach acid (H2 blockers). ? Stronger medicines to reduce stomach acid (proton pump inhibitors).  Surgery to repair the hernia, if other treatments are not helping.  If you have no symptoms, you may not need treatment. Follow these instructions at home: Lifestyle and activity  Do not use any products that contain nicotine or tobacco, such as cigarettes and e-cigarettes. If you need help quitting, ask your health care provider.  Try to achieve and maintain a healthy body weight.  Avoid putting pressure on your abdomen. Anything that puts pressure on your abdomen increases the amount of acid that may be pushed up into your esophagus. ? Avoid bending over, especially after eating. ? Raise the head of your bed by putting blocks under the legs. This  keeps your head and esophagus higher than your stomach. ? Do not wear tight clothing around your chest or stomach. ? Try not to strain when having a bowel movement, when urinating, or when lifting heavy objects. Eating and drinking  Avoid foods that can worsen GERD symptoms. These may include: ? Fatty foods, like fried foods. ? Citrus fruits, like oranges or lemon. ? Other foods and drinks that contain acid, like orange juice or tomatoes. ? Spicy food. ? Chocolate.  Eat frequent small meals instead of three large meals a day. This helps prevent your stomach from getting too full. ? Eat slowly. ? Do not lie down right after eating. ? Do not eat 1-2 hours before bed.  Do not drink beverages with caffeine. These include cola, coffee, cocoa, and tea.  Do not drink alcohol. General instructions  Take over-the-counter and prescription medicines only as told by your health care provider.  Keep all follow-up visits as told by your health care provider. This is important. Contact a health care provider if:  Your symptoms are not controlled with medicines or lifestyle changes.  You are having trouble swallowing.  You have coughing or wheezing that will not go away. Get help right away if:  Your pain is getting worse.  Your pain spreads to your arms, neck, jaw, teeth, or back.  You have shortness of breath.  You sweat for no reason.  You feel sick to your stomach (nauseous) or you vomit.  You vomit blood.  You have bright red blood in your stools.  You have black, tarry stools. This information is not intended to replace advice given to you by your health care provider. Make sure you discuss any questions you have with your health care provider. Document Released: 12/29/2003 Document Revised: 10/01/2016 Document Reviewed: 10/01/2016 Elsevier Interactive Patient Education  [employer].

## 2017-07-19 NOTE — H&P (Signed)
Samantha Wang is an 46 y.o. female.   Chief Complaint: Patient is here for EGD and colonoscopy. HPI: patient is 17 old Caucasian female who has several year history of nonhealing peptic ulcer disease. She underwent antrectomy and truncal vagotomy in November 2017 at [facility]. She has not felt well ever since. She has had postprandial diarrhea.She also has developed iron deficiency anemia. She denies melena or rectal bleeding. Her appetite is fair and she has not lost any weight.She does not take OTC NSAIDs. He is here for diagnostic EGD and colonoscopy. Family history negative celiac disease or CRC.  Past Medical History:  Diagnosis Date  . Allergy   . Anemia   . Anxiety   . Breast nodule    left   . GERD (gastroesophageal reflux disease)   . H/O cesarean section    history of 2 c-sections    . History of hiatal hernia   . Peptic ulcer disease   . PONV (postoperative nausea and vomiting)   . Reflux     Past Surgical History:  Procedure Laterality Date  . CESAREAN SECTION  [id number]  . CESAREAN SECTION     x2  . ESOPHAGOGASTRODUODENOSCOPY N/A 10/11/2015   Procedure: ESOPHAGOGASTRODUODENOSCOPY (EGD);  Surgeon: Malissa Hippo, MD;  Location: [facility];  Service: Endoscopy;  Laterality: N/A;  1230  . ESOPHAGOGASTRODUODENOSCOPY N/A 06/06/2016   Procedure: ESOPHAGOGASTRODUODENOSCOPY (EGD);  Surgeon: Malissa Hippo, MD;  Location: [facility];  Service: Endoscopy;  Laterality: N/A;  255  . ulcer surgery  08/2016   abdominal  . UPPER GASTROINTESTINAL ENDOSCOPY      Family History  Problem Relation Age of Onset  . Healthy Mother   . Hyperlipidemia Father   . Healthy Brother   . Healthy Son   . Healthy Daughter   . Breast cancer Maternal Grandmother    Social History:  reports that she has never smoked. She has never used smokeless tobacco. She reports that she drinks about 1.2 oz of alcohol per week . She reports that she does not use drugs.  Allergies: No Known  Allergies  Medications Prior to Admission  Medication Sig Dispense Refill  . clonazePAM (KLONOPIN) 0.5 MG tablet 1/2 to 1 po BID prn anxiety (Patient taking differently: Take 0.25-0.5 mg by mouth 2 (two) times daily as needed. 1/2 to 1 po BID prn anxiety) 30 tablet 5  . dicyclomine (BENTYL) 10 MG capsule Take 1 capsule (10 mg total) by mouth 3 (three) times daily before meals. (Patient taking differently: Take 10 mg by mouth 3 (three) times daily with meals as needed. ) 90 capsule 5  . flintstones complete (FLINTSTONES) 60 MG chewable tablet Chew 1 tablet by mouth daily. With iron    . fluticasone (FLONASE) 50 MCG/ACT nasal spray USE TWO SPRAY(S) IN EACH NOSTRIL ONCE DAILY AS NEEDED FOR CONGESTION --  **NEEDS  OFFICE  VISIT** 16 g 5  . fluticasone (FLONASE) 50 MCG/ACT nasal spray Place 2 sprays into both nostrils daily.    Marland Kitchen loratadine (CLARITIN) 10 MG tablet Take 10 mg by mouth daily as needed for allergies.       No results found for this or any previous visit (from the past 48 hour(s)). No results found.  ROS  Blood pressure 131/86, pulse 95, temperature 98.9 F (37.2 C), temperature source Oral, resp. rate 15, height  (1.575 m), weight 133 lb (60.3 kg), last menstrual period 06/27/2017, SpO2 100 %. Physical Exam  Constitutional: She appears  well-developed and well-nourished.  HENT:  Mouth/Throat: Oropharynx is clear and moist.  Eyes: Conjunctivae are normal. No scleral icterus.  Neck: No thyromegaly present.  Cardiovascular: Normal rate, regular rhythm and normal heart sounds.   No murmur heard. Respiratory: Effort normal and breath sounds normal.  GI: Soft. She exhibits no distension and no mass. There is no tenderness.  Upper midline scar.  Musculoskeletal: She exhibits no edema.  Lymphadenopathy:    She has no cervical adenopathy.  Neurological: She is alert.  Skin: Skin is warm and dry.     Assessment/Plan Iron deficiency anemia. History of peptic ulcer disease.  Status post antrectomy and truncal vagotomy November 2017. Diagnostic EGD and colonoscopy.  Lionel December, MD 07/19/2017, 8:23 AM

## 2017-07-19 NOTE — Op Note (Signed)
[facility] Patient Name: Samantha Wang Procedure Date: 07/19/2017 8:45 AM MRN: [id number] Date of Birth: 1970-12-08 Attending MD: Lionel December , MD CSN: [id number] Age: 46 Admit Type: Outpatient Procedure:                Colonoscopy Indications:              Diarrhea, Iron deficiency anemia Providers:                Lionel December, MD, Loma Messing B. Patsy Lager, RN, Dyann Ruddle Referring MD:             Vilinda Blanks. Gerda Diss, MD Medicines:                Meperidine 50 mg IV, Midazolam 1 mg IV Complications:            No immediate complications. Estimated Blood Loss:     Estimated blood loss: none. Procedure:                Pre-Anesthesia Assessment:                           - Prior to the procedure, a History and Physical                            was performed, and patient medications and                            allergies were reviewed. The patient's tolerance of                            previous anesthesia was also reviewed. The risks                            and benefits of the procedure and the sedation                            options and risks were discussed with the patient.                            All questions were answered, and informed consent                            was obtained. Prior Anticoagulants: The patient has                            taken no previous anticoagulant or antiplatelet                            agents. ASA Grade Assessment: II - A patient with                            mild systemic disease. After reviewing the risks  and benefits, the patient was deemed in                            satisfactory condition to undergo the procedure.                           After obtaining informed consent, the colonoscope                            was passed under direct vision. Throughout the                            procedure, the patient's blood pressure, pulse, and     oxygen saturations were monitored continuously. The                            [id number] ([id number]) scope was introduced through                            the anus and advanced to the the splenic flexure.                            The [id number] ([id number]) scope was introduced                            through the with the intention of advancing to the                            splenic flexure. The scope was advanced to the anus                            before the procedure was aborted. Medications were                            given. The colonoscopy was technically difficult                            and complex due to significant looping and a                            tortuous colon. The patient tolerated the procedure                            well. The quality of the bowel preparation was                            excellent. The rectum was photographed. Two scopes                            were used diameter of 3.2 and 3.8. Scope In: 8:48:29 AM Scope Out: 9:38:14 AM Total Procedure Duration: 0 hours 49 minutes 45 seconds  Findings:      The perianal and digital rectal examinations were normal.  The rectum, recto-sigmoid colon, sigmoid colon, descending colon and       splenic flexure appeared normal.      External hemorrhoids were found during retroflexion. The hemorrhoids       were small.      Anal papilla(e) were hypertrophied. Impression:               - Incomplete exam to splenic flexure.                           - The rectum, recto-sigmoid colon, sigmoid colon,                            descending colon and splenic flexure are normal.                           - External hemorrhoids.                           - Anal papilla(e) were hypertrophied.                           - No specimens collected. Moderate Sedation:      Moderate (conscious) sedation was administered by the endoscopy nurse       and supervised by the endoscopist. The following parameters  were       monitored: oxygen saturation, heart rate, blood pressure, CO2       capnography and response to care. Total physician intraservice time was       54 minutes. Recommendation:           - Patient has a contact number available for                            emergencies. The signs and symptoms of potential                            delayed complications were discussed with the                            patient. Return to normal activities tomorrow.                            Written discharge instructions were provided to the                            patient.                           - Resume previous diet today.                           - Continue present medications.                           - Will check H&H today.                           - Perform a virtual  colonoscopy at appointment to                            be scheduled. Procedure Code(s):        --- Professional ---                           [id number], 53, Colonoscopy, flexible; diagnostic,                            including collection of specimen(s) by brushing or                            washing, when performed (separate procedure)                           99152, Moderate sedation services provided by the                            same physician or other qualified health care                            professional performing the diagnostic or                            therapeutic service that the sedation supports,                            requiring the presence of an independent trained                            observer to assist in the monitoring of the                            patient's level of consciousness and physiological                            status; initial 15 minutes of intraservice time,                            patient age 61 years or older                           [phone], Moderate sedation services; each additional                            15 minutes intraservice time                            99153, Moderate sedation services; each additional                            15 minutes intraservice time                           99153, Moderate sedation services;  each additional                            15 minutes intraservice time Diagnosis Code(s):        --- Professional ---                           K64.4, Residual hemorrhoidal skin tags                           K62.89, Other specified diseases of anus and rectum                           R19.7, Diarrhea, unspecified                           D50.9, Iron deficiency anemia, unspecified CPT copyright 2016 [employer]. All rights reserved. The codes documented in this report are preliminary and upon coder review may  be revised to meet current compliance requirements. Lionel December, MD Lionel December, MD 07/19/2017 9:56:11 AM This report has been signed electronically. Number of Addenda: 0

## 2017-07-19 NOTE — Op Note (Signed)
[facility] Patient Name: Samantha Wang Procedure Date: 07/19/2017 8:03 AM MRN: [id number] Date of Birth: 02/10/1971 Attending MD: Lionel December , MD CSN: [id number] Age: 46 Admit Type: Outpatient Procedure:                Upper GI endoscopy Indications:              Iron deficiency anemia, history of peptic ulcer                            disease. Status post surgery November 2017. Providers:                Lionel December, MD, Criselda Peaches. Patsy Lager, RN, Dyann Ruddle Referring MD:             Vilinda Blanks. Gerda Diss, MD Medicines:                Lidocaine spray, Meperidine 50 mg IV, Midazolam 12                            mg IV, Promethazine 12.5 mg IV Complications:            No immediate complications. Estimated Blood Loss:     Estimated blood loss was minimal. Procedure:                Pre-Anesthesia Assessment:                           - Prior to the procedure, a History and Physical                            was performed, and patient medications and                            allergies were reviewed. The patient's tolerance of                            previous anesthesia was also reviewed. The risks                            and benefits of the procedure and the sedation                            options and risks were discussed with the patient.                            All questions were answered, and informed consent                            was obtained. Prior Anticoagulants: The patient has                            taken no previous anticoagulant or antiplatelet  agents. ASA Grade Assessment: II - A patient with                            mild systemic disease. After reviewing the risks                            and benefits, the patient was deemed in                            satisfactory condition to undergo the procedure.                           After obtaining informed consent, the endoscope was                       passed under direct vision. Throughout the                            procedure, the patient's blood pressure, pulse, and                            oxygen saturations were monitored continuously. The                            [id number] ([id number]) scope was introduced through the                            mouth, and advanced to the third part of duodenum.                            The upper GI endoscopy was accomplished without                            difficulty. The patient tolerated the procedure                            well. Scope In: 8:37:42 AM Scope Out: 8:45:14 AM Total Procedure Duration: 0 hours 7 minutes 32 seconds  Findings:      The examined esophagus was normal.      The Z-line was irregular and was found 36 cm from the incisors.      A 2 cm hiatal hernia was present.      Evidence of a patent Billroth I gastroduodenostomy was found. A gastric       pouch with a normal size was found. The gastroduodenal anastomosis was       characterized by healthy appearing mucosa and visible sutures. This was       traversed.      The cardia and gastric fundus were normal.      The second portion of the duodenum and third portion of the duodenum       were normal. Biopsies were taken with a cold forceps for histology. Impression:               - Normal esophagus.                           -  Z-line irregular, 36 cm from the incisors.                           - 2 cm hiatal hernia.                           - Patent Billroth I gastroduodenostomy was found,                            characterized by healthy appearing mucosa and                            visible sutures.                           - Normal cardia and gastric fundus.                           - Normal second portion of the duodenum and third                            portion of the duodenum. Biopsied. Moderate Sedation:      Moderate (conscious) sedation was administered by the endoscopy nurse        and supervised by the endoscopist. The following parameters were       monitored: oxygen saturation, heart rate, blood pressure, CO2       capnography and response to care. Total physician intraservice time was       16 minutes. Recommendation:           - Patient has a contact number available for                            emergencies. The signs and symptoms of potential                            delayed complications were discussed with the                            patient. Return to normal activities tomorrow.                            Written discharge instructions were provided to the                            patient.                           - Resume previous diet today.                           - Continue present medications.                           - Await pathology results.                           -  See the other procedure note for documentation of                            additional recommendations. Procedure Code(s):        --- Professional ---                           [phone], Esophagogastroduodenoscopy, flexible,                            transoral; with biopsy, single or multiple                           99152, Moderate sedation services provided by the                            same physician or other qualified health care                            professional performing the diagnostic or                            therapeutic service that the sedation supports,                            requiring the presence of an independent trained                            observer to assist in the monitoring of the                            patient's level of consciousness and physiological                            status; initial 15 minutes of intraservice time,                            patient age 60 years or older Diagnosis Code(s):        --- Professional ---                           K22.8, Other specified diseases of esophagus                            K44.9, Diaphragmatic hernia without obstruction or                            gangrene                           Z98.0, Intestinal bypass and anastomosis status                           D50.9, Iron deficiency anemia, unspecified CPT copyright 2016 American Medical Association. All rights reserved. The codes documented in this report are preliminary and upon coder  review may  be revised to meet current compliance requirements. Lionel December, MD Lionel December, MD 07/19/2017 9:49:12 AM This report has been signed electronically. Number of Addenda: 0

## 2017-07-25 ENCOUNTER — Other Ambulatory Visit (INDEPENDENT_AMBULATORY_CARE_PROVIDER_SITE_OTHER): Payer: Self-pay | Admitting: Internal Medicine

## 2017-07-25 ENCOUNTER — Telehealth (INDEPENDENT_AMBULATORY_CARE_PROVIDER_SITE_OTHER): Payer: Self-pay | Admitting: *Deleted

## 2017-07-25 DIAGNOSIS — Z1211 Encounter for screening for malignant neoplasm of colon: Secondary | ICD-10-CM

## 2017-07-25 DIAGNOSIS — Q438 Other specified congenital malformations of intestine: Secondary | ICD-10-CM

## 2017-07-25 NOTE — Telephone Encounter (Signed)
She does not need. Office visit

## 2017-07-25 NOTE — Telephone Encounter (Signed)
Virtual TCS sch'd 08/20/17 -- has 3 mth OV 08/06/17 -- patient wants to know if she needs to keep 08/06/17 appt -- please advise

## 2017-07-25 NOTE — Telephone Encounter (Signed)
Patient aware, appt cx'd

## 2017-07-26 ENCOUNTER — Encounter (HOSPITAL_COMMUNITY): Payer: Self-pay | Admitting: Internal Medicine

## 2017-08-06 ENCOUNTER — Ambulatory Visit (INDEPENDENT_AMBULATORY_CARE_PROVIDER_SITE_OTHER): Payer: 59 | Admitting: Internal Medicine

## 2017-08-20 ENCOUNTER — Ambulatory Visit
Admission: RE | Admit: 2017-08-20 | Discharge: 2017-08-20 | Disposition: A | Discharge status: Home / Self Care | Payer: 59 | Source: Ambulatory Visit | Attending: Internal Medicine | Admitting: Internal Medicine

## 2017-08-20 DIAGNOSIS — Q438 Other specified congenital malformations of intestine: Secondary | ICD-10-CM

## 2017-08-23 ENCOUNTER — Other Ambulatory Visit (INDEPENDENT_AMBULATORY_CARE_PROVIDER_SITE_OTHER): Payer: Self-pay | Admitting: *Deleted

## 2017-08-23 ENCOUNTER — Ambulatory Visit: Payer: 59 | Admitting: Family Medicine

## 2017-08-23 ENCOUNTER — Encounter (INDEPENDENT_AMBULATORY_CARE_PROVIDER_SITE_OTHER): Payer: Self-pay | Admitting: *Deleted

## 2017-08-23 DIAGNOSIS — K279 Peptic ulcer, site unspecified, unspecified as acute or chronic, without hemorrhage or perforation: Secondary | ICD-10-CM

## 2017-08-26 NOTE — Progress Notes (Signed)
Sent Reba a staff message for a spot for this patient.

## 2017-08-29 ENCOUNTER — Ambulatory Visit: Payer: 59 | Admitting: Family Medicine

## 2017-08-29 ENCOUNTER — Encounter: Payer: Self-pay | Admitting: Family Medicine

## 2017-08-29 VITALS — BP 126/88 | Ht 62.0 in | Wt 143.2 lb

## 2017-08-29 DIAGNOSIS — F5101 Primary insomnia: Secondary | ICD-10-CM | POA: Diagnosis not present

## 2017-08-29 DIAGNOSIS — K279 Peptic ulcer, site unspecified, unspecified as acute or chronic, without hemorrhage or perforation: Secondary | ICD-10-CM | POA: Diagnosis not present

## 2017-08-29 MED ORDER — CLONAZEPAM 0.5 MG PO TABS: ORAL_TABLET | ORAL | 5 refills | Status: DC

## 2017-08-29 NOTE — Progress Notes (Signed)
Subjective:    Patient ID: Samantha Wang, female    DOB: [birth year], 46 y.o.   MRN: [id number]  Anxiety  Presents for follow-up visit.    Patient states no other concerns this visit.   Ulcer had imroved post ulcer  Patient still has an occasional abdominal cramps.  She asked me to look at her scan results/virtual colonoscopy result.  Discussed with patient  Patient on medication for chronic anxiety.  Ongoing challenges and stress.  Uses a half a tablet sparingly for anxiety and often at night to help with sleep  .insom   Patient compliant with insomnia medication. Generally takes most nights. No obvious morning drowsiness. Definitely helps patient sleep. Without it patient states would not get a good nights rest.                                                                                     surg and things looked staable  Adhesions and scar tissue s    Review of Systems No headache, no major weight loss or weight gain, no chest pain no back pain abdominal pain no change in bowel habits complete ROS otherwise negative     Objective:   Physical Exam   Alert vitals stable, NAD. Blood pressure good on repeat. HEENT normal. Lungs clear. Heart regular rate and rhythm.      Assessment & Plan:  Impression 1 insomnia discussed.  Clonazepam helps side effects benefits discussed.  To maintain same also element of anxiety.  Encouraged exercise to help with this.  6 months worth written.  Patient can call in 6 months and we will extend this for 6 more months since taking the equivalent of only 1 tablet/day

## 2017-08-30 LAB — HEMOGLOBIN AND HEMATOCRIT, BLOOD
HEMATOCRIT: 34.3 % (ref 34.0–46.6)
Hemoglobin: 11.2 g/dL (ref 11.1–15.9)

## 2017-09-03 ENCOUNTER — Encounter (INDEPENDENT_AMBULATORY_CARE_PROVIDER_SITE_OTHER): Payer: Self-pay | Admitting: Internal Medicine

## 2017-09-19 ENCOUNTER — Ambulatory Visit (INDEPENDENT_AMBULATORY_CARE_PROVIDER_SITE_OTHER): Payer: 59 | Admitting: Internal Medicine

## 2017-09-19 ENCOUNTER — Encounter (INDEPENDENT_AMBULATORY_CARE_PROVIDER_SITE_OTHER): Payer: Self-pay | Admitting: Internal Medicine

## 2017-09-19 VITALS — BP 118/90 | HR 74 | Temp 98.5°F | Resp 18 | Ht 62.0 in | Wt 141.1 lb

## 2017-09-19 DIAGNOSIS — Z862 Personal history of diseases of the blood and blood-forming organs and certain disorders involving the immune mechanism: Secondary | ICD-10-CM

## 2017-09-19 DIAGNOSIS — K580 Irritable bowel syndrome with diarrhea: Secondary | ICD-10-CM

## 2017-09-19 NOTE — Patient Instructions (Signed)
Next blood work in first or second week of February 2019. Notify if IBS symptoms get worse.

## 2017-09-19 NOTE — Progress Notes (Signed)
Presenting complaint;  Follow-up for diarrhea urgency and iron deficiency anemia.  Database and subjective:  Patient is 46 year old Caucasian female who has several year history of nonhealing peptic ulcer disease.  Last year she was noted to have developed a very large prepyloric ulcer with deformity to pylorus.  She underwent antrectomy and truncal vagotomy by Dr[facility] Deveron FurlongPerry Shen [facility] in November 2017.  Since then she has developed diarrhea with urgency felt to be due to dumping syndrome and IBS.  She has been under a lot of stress.  She also developed iron deficiency anemia.  She underwent EGD and colonoscopy in September 2018.  EGD reveal no evidence ofed recurrent peptic ulcer disease and gastro duodenostomy was patent.  Small bowel biopsy was negative for celiac disease.  Colonoscopy is was incomplete and followed by virtual colonoscopy.  She has been on p.o. iron as well as dicyclomine.  Patient is here for scheduled visit.  She states she feels about the same.  She has anywhere from 1-5 stools per day.  Symptoms that bother her as the most is the urgency.  She occasionally may skip a day.  She does not feel constipated though.  She denies melena or rectal bleeding nausea or vomiting.  She also complains of feeling a knot in epigastric region.  The symptoms are unrelated to meals.  It is not relieved with defecation or passing flatus.  The symptom may last for hours.  She is not having the symptoms today.  She says her appetite is not good.  She has gained 3 pounds since her last visit though.  She feels the anxiolytic is helping her IBS symptoms and urgency much better than dicyclomine.  She uses dicyclomine only on as-needed basis.  She states her symptoms have affected the quality of her life which she has learned to cope with her symptoms better than she has in the past.    Current Medications: Outpatient Encounter Medications as of 09/19/2017  Medication Sig  . clonazePAM (KLONOPIN) 0.5 MG  tablet 1/2 to 1 po BID prn anxiety  . dicyclomine (BENTYL) 10 MG capsule Take 1 capsule (10 mg total) by mouth 3 (three) times daily before meals. (Patient taking differently: Take 10 mg by mouth 3 (three) times daily with meals as needed. )  . flintstones complete (FLINTSTONES) 60 MG chewable tablet Chew 1 tablet by mouth daily. With iron  . fluticasone (FLONASE) 50 MCG/ACT nasal spray USE TWO SPRAY(S) IN EACH NOSTRIL ONCE DAILY AS NEEDED FOR CONGESTION --  **NEEDS  OFFICE  VISIT**  . loratadine (CLARITIN) 10 MG tablet Take 10 mg by mouth daily as needed for allergies.   . [DISCONTINUED] fluticasone (FLONASE) 50 MCG/ACT nasal spray Place 2 sprays into both nostrils daily.   No facility-administered encounter medications on file as of 09/19/2017.      Objective: Blood pressure 118/90, pulse 74, temperature 98.5 F (36.9 C), temperature source Oral, resp. rate 18, height 5\' 2"  (1.575 m), weight 141 lb 1.6 oz (64 kg), last menstrual period 08/30/2017. Patient is alert and in no acute distress. Conjunctiva is pink. Sclera is nonicteric Oropharyngeal mucosa is normal. No neck masses or thyromegaly noted. Cardiac exam with regular rhythm normal S1 and S2. No murmur or gallop noted. Lungs are clear to auscultation. Abdomen is symmetrical.  She has well-healed upper midline scar.  Bowel sounds are normal.  No incisional hernia noted.  Abdomen is soft and nontender and percussion note is tympanitic.  No organomegaly or masses either.  No LE edema or clubbing noted.  Labs/studies Results:  Lab data from 08/29/2017 H&H 11.2 and 34.3.   Assessment:  #1.  Irritable bowel syndrome.  Diarrhea secondary to IBS and gastric surgery for nonhealing peptic ulcer disease.  Her symptoms are made worse by stress and anxiety.  She seems to be getting more relief with anxiolytic rather than dicyclomine.  She is using dicyclomine when diarrhea and her urgency is more pronounced.  She is maintaining her  weight.  #2.  History of iron deficiency anemia.  Recent EGD was negative for recurrent peptic ulcer disease.  Duodenal biopsy was negative for celiac disease.  Colonoscopy was incomplete followed by virtual colonoscopy and no significant abnormality noted.  She has responded to p.o. Iron.  #3.  Intermittent epigastric "knot".  She does not have incisional hernia on examination this symptom may be due to aerophagia or colonic spasm.  If she has another prolonged episode will consider plain abdominal film to determine if she has gastric or colonic distention.  Plan:  Patient reassured. Patient will call office if epigastric discomfort increases in which case we will do plain abdominal film. She will continue Flintstone chewable with iron 1 tablet daily. She will continue to use dicyclomine on as-needed basis. CBC in 3 months. Office visit in 6 months.

## 2017-10-21 ENCOUNTER — Encounter: Payer: Self-pay | Admitting: Family Medicine

## 2017-10-21 ENCOUNTER — Ambulatory Visit: Payer: 59 | Admitting: Family Medicine

## 2017-10-21 VITALS — BP 138/86 | Temp 98.1°F | Ht 62.0 in | Wt 146.0 lb

## 2017-10-21 DIAGNOSIS — J0110 Acute frontal sinusitis, unspecified: Secondary | ICD-10-CM | POA: Diagnosis not present

## 2017-10-21 MED ORDER — AMOXICILLIN 500 MG PO CAPS: 500.0000 mg | ORAL_CAPSULE | Freq: Three times a day (TID) | ORAL | 0 refills | Status: DC

## 2017-10-21 NOTE — Progress Notes (Signed)
Subjective:    Patient ID: Antony Contras, female    DOB: 29-Aug-1971, 46 y.o.   MRN: [id number]  Sinusitis  This is a new problem. Episode onset: 2 weeks. Associated symptoms include congestion, coughing, ear pain, headaches and a sore throat. Past treatments include acetaminophen (allergy meds).   comong on slowly   Cough and cong and dranage  Di energy  Trying otc meds 2ithout much help  gafusl progression of symtom   Morning thr pain is worse, but doe  No sig disch at this time   Full ear sens ation both ears, transient dizzines both ears   Review of Systems  HENT: Positive for congestion, ear pain and sore throat.   Respiratory: Positive for cough.   Neurological: Positive for headaches.       Objective:   Physical Exam Alert, mild malaise. Hydration good Vitals stable. frontal/ maxillary tenderness evident positive nasal congestion. pharynx normal neck supple  lungs clear/no crackles or wheezes. heart regular in rhythm        Assessment & Plan:  Impression rhinosinusitis likely post viral, discussed with patient. plan antibiotics prescribed. Questions answered. Symptomatic care discussed. warning signs discussed. WSL

## 2018-03-13 ENCOUNTER — Encounter: Payer: Self-pay | Admitting: Certified Nurse Midwife

## 2018-03-13 ENCOUNTER — Ambulatory Visit: Payer: 59 | Admitting: Certified Nurse Midwife

## 2018-03-13 ENCOUNTER — Other Ambulatory Visit: Payer: Self-pay

## 2018-03-13 ENCOUNTER — Other Ambulatory Visit (HOSPITAL_COMMUNITY)
Admission: RE | Admit: 2018-03-13 | Discharge: 2018-03-13 | Disposition: A | Discharge status: Home / Self Care | Payer: 59 | Source: Ambulatory Visit | Attending: Obstetrics & Gynecology | Admitting: Obstetrics & Gynecology

## 2018-03-13 VITALS — BP 110/64 | HR 68 | Resp 16 | Ht 62.5 in | Wt 150.0 lb

## 2018-03-13 DIAGNOSIS — Z0000 Encounter for general adult medical examination without abnormal findings: Secondary | ICD-10-CM

## 2018-03-13 DIAGNOSIS — R5383 Other fatigue: Secondary | ICD-10-CM

## 2018-03-13 DIAGNOSIS — Z124 Encounter for screening for malignant neoplasm of cervix: Secondary | ICD-10-CM

## 2018-03-13 DIAGNOSIS — Z862 Personal history of diseases of the blood and blood-forming organs and certain disorders involving the immune mechanism: Secondary | ICD-10-CM

## 2018-03-13 DIAGNOSIS — G43001 Migraine without aura, not intractable, with status migrainosus: Secondary | ICD-10-CM

## 2018-03-13 DIAGNOSIS — Z01419 Encounter for gynecological examination (general) (routine) without abnormal findings: Secondary | ICD-10-CM

## 2018-03-13 NOTE — Progress Notes (Signed)
47 y.o. G74P2002 Married  Caucasian Fe here for annual exam. Periods normal, has been keeping menses calendar and are regular. Some heavy occurrences, but headaches have increased with periods, either before or after and medication does not relieve the occurrence. Denies aura, but nausea continually with occurrence. " Need some relief from the headaches.. GI issues still persist once ulcers removed, but less, some IBS. Social stress with mother's death suddenly from heart attack after being treated for brain cancer. Emotionally still coping with her death. Changes at work adding to stress. Sees MD for Klonopin and Bentyl medication management. No other health issues today.  Patient's last menstrual period was 02/23/2018 (exact date).          Sexually active: Yes.    The current method of family planning is vasectomy.    Exercising: Yes.    walking Smoker:  no  Health Maintenance: Pap:  03-08-16 neg HPV HR neg History of Abnormal Pap: no MMG:  12-18-16 category c density birads 1:neg Self Breast exams: occ Colonoscopy:  2018 virtual BMD:   2016 TDaP:  2011 Shingles: no Pneumonia: no Hep C and HIV: Hep c neg 2017 Labs: yes   reports that she has never smoked. She has never used smokeless tobacco. She reports that she drinks about 0.6 - 1.2 oz of alcohol per week. She reports that she does not use drugs.  Past Medical History:  Diagnosis Date  . Allergy   . Anemia   . Anxiety   . Breast nodule    left   . GERD (gastroesophageal reflux disease)   . H/O cesarean section    history of 2 c-sections    . History of hiatal hernia   . Peptic ulcer disease   . PONV (postoperative nausea and vomiting)   . Reflux     Past Surgical History:  Procedure Laterality Date  . BIOPSY  07/19/2017   Procedure: BIOPSY;  Surgeon: Malissa Hippo, MD;  Location: [facility];  Service: Endoscopy;;  duodenum  . CESAREAN SECTION  [id number]  . CESAREAN SECTION     x2  . COLONOSCOPY N/A 07/19/2017   Procedure: COLONOSCOPY;  Surgeon: Malissa Hippo, MD;  Location: [facility];  Service: Endoscopy;  Laterality: N/A;  . ESOPHAGOGASTRODUODENOSCOPY N/A 10/11/2015   Procedure: ESOPHAGOGASTRODUODENOSCOPY (EGD);  Surgeon: Malissa Hippo, MD;  Location: [facility];  Service: Endoscopy;  Laterality: N/A;  1230  . ESOPHAGOGASTRODUODENOSCOPY N/A 06/06/2016   Procedure: ESOPHAGOGASTRODUODENOSCOPY (EGD);  Surgeon: Malissa Hippo, MD;  Location: [facility];  Service: Endoscopy;  Laterality: N/A;  255  . ESOPHAGOGASTRODUODENOSCOPY N/A 07/19/2017   Procedure: ESOPHAGOGASTRODUODENOSCOPY (EGD);  Surgeon: Malissa Hippo, MD;  Location: [facility];  Service: Endoscopy;  Laterality: N/A;  8:25  . ulcer surgery  08/2016   abdominal  . UPPER GASTROINTESTINAL ENDOSCOPY      Current Outpatient Medications  Medication Sig Dispense Refill  . clonazePAM (KLONOPIN) 0.5 MG tablet 1/2 to 1 po BID prn anxiety 30 tablet 5  . dicyclomine (BENTYL) 10 MG capsule Take 1 capsule (10 mg total) by mouth 3 (three) times daily before meals. (Patient taking differently: Take 10 mg by mouth 3 (three) times daily with meals as needed. ) 90 capsule 5  . flintstones complete (FLINTSTONES) 60 MG chewable tablet Chew 1 tablet by mouth daily. With iron    . fluticasone (FLONASE) 50 MCG/ACT nasal spray USE TWO SPRAY(S) IN EACH NOSTRIL ONCE DAILY AS NEEDED FOR CONGESTION --  **  NEEDS  OFFICE  VISIT** 16 g 5  . loratadine (CLARITIN) 10 MG tablet Take 10 mg by mouth daily as needed for allergies.      No current facility-administered medications for this visit.     Family History  Problem Relation Age of Onset  . Healthy Mother   . Hyperlipidemia Father   . Healthy Brother   . Healthy Son   . Healthy Daughter   . Breast cancer Maternal Grandmother     ROS:  Pertinent items are noted in HPI.  Otherwise, a comprehensive ROS was negative.  Exam:   BP 110/64   Pulse 68   Resp 16   Ht 5' 2.5" (1.588 m)   Wt 150 lb  (68 kg)   LMP 02/23/2018 (Exact Date)   BMI 27.00 kg/m  Height: 5' 2.5" (158.8 cm) Ht Readings from Last 3 Encounters:  03/13/18 5' 2.5" (1.588 m)  10/21/17  (1.575 m)  09/19/17  (1.575 m)    General appearance: alert, cooperative and appears stated age Head: Normocephalic, without obvious abnormality, atraumatic Neck: no adenopathy, supple, symmetrical, trachea midline and thyroid normal to inspection and palpation Lungs: clear to auscultation bilaterally Breasts: normal appearance, no masses or tenderness, No nipple retraction or dimpling, No nipple discharge or bleeding, No axillary or supraclavicular adenopathy Heart: regular rate and rhythm Abdomen: soft, non-tender; no masses,  no organomegaly Extremities: extremities normal, atraumatic, no cyanosis or edema Skin: Skin color, texture, turgor normal. No rashes or lesions Lymph nodes: Cervical, supraclavicular, and axillary nodes normal. No abnormal inguinal nodes palpated Neurologic: Grossly normal   Pelvic: External genitalia:  no lesions              Urethra:  normal appearing urethra with no masses, tenderness or lesions              Bartholin's and Skene's: normal                 Vagina: normal appearing vagina with normal color and discharge, no lesions              Cervix: multiparous appearance, no cervical motion tenderness and no lesions              Pap taken: Yes.   Bimanual Exam:  Uterus:  normal size, contour, position, consistency, mobility, non-tender and anteverted              Adnexa: normal adnexa and no mass, fullness, tenderness               Rectovaginal: Confirms               Anus:  normal sphincter tone, no lesions  Chaperone present: yes  A:  Well Woman with normal exam  Contraception spouse vasectomy  Menstrual migraines and also migraines without period with increasing intensity, no aura  Social stress with mother's death  History of ulcers with GI management continuing  Mammogram  due, patient will schedule  Screening labs  P:   Reviewed health and wellness pertinent to exam  Discussed referral to headache and wellness for evaluation and treatment of headaches. Patient agreeable. Referral placed and patient will be called with information. Warning signs with headaches given and need for ER visit if occurs.  Encouraged to seek friend and family for support as needed.  Continue follow up with GI as indicated  Lab: TSH, Lipid panel ,CBC, CMP, Vit. D  Pap smear: yes   counseled on breast  self exam, mammography screening, adequate intake of calcium and vitamin D, diet and exercise  return annually or prn  An After Visit Summary was printed and given to the patient.

## 2018-03-13 NOTE — Patient Instructions (Signed)
EXERCISE AND DIET:  We recommended that you start or continue a regular exercise program for good health. Regular exercise means any activity that makes your heart beat faster and makes you sweat.  We recommend exercising at least 30 minutes per day at least 3 days a week, preferably 4 or 5.  We also recommend a diet low in fat and sugar.  Inactivity, poor dietary choices and obesity can cause diabetes, heart attack, stroke, and kidney damage, among others.    ALCOHOL AND SMOKING:  Women should limit their alcohol intake to no more than 7 drinks/beers/glasses of wine (combined, not each!) per week. Moderation of alcohol intake to this level decreases your risk of breast cancer and liver damage. And of course, no recreational drugs are part of a healthy lifestyle.  And absolutely no smoking or even second hand smoke. Most people know smoking can cause heart and lung diseases, but did you know it also contributes to weakening of your bones? Aging of your skin?  Yellowing of your teeth and nails?  CALCIUM AND VITAMIN D:  Adequate intake of calcium and Vitamin D are recommended.  The recommendations for exact amounts of these supplements seem to change often, but generally speaking 600 mg of calcium (either carbonate or citrate) and 800 units of Vitamin D per day seems prudent. Certain women may benefit from higher intake of Vitamin D.  If you are among these women, your doctor will have told you during your visit.    PAP SMEARS:  Pap smears, to check for cervical cancer or precancers,  have traditionally been done yearly, although recent scientific advances have shown that most women can have pap smears less often.  However, every woman still should have a physical exam from her gynecologist every year. It will include a breast check, inspection of the vulva and vagina to check for abnormal growths or skin changes, a visual exam of the cervix, and then an exam to evaluate the size and shape of the uterus and  ovaries.  And after 47 years of age, a rectal exam is indicated to check for rectal cancers. We will also provide age appropriate advice regarding health maintenance, like when you should have certain vaccines, screening for sexually transmitted diseases, bone density testing, colonoscopy, mammograms, etc.   MAMMOGRAMS:  All women over 40 years old should have a yearly mammogram. Many facilities now offer a "3D" mammogram, which may cost around $50 extra out of pocket. If possible,  we recommend you accept the option to have the 3D mammogram performed.  It both reduces the number of women who will be called back for extra views which then turn out to be normal, and it is better than the routine mammogram at detecting truly abnormal areas.    COLONOSCOPY:  Colonoscopy to screen for colon cancer is recommended for all women at age 50.  We know, you hate the idea of the prep.  We agree, BUT, having colon cancer and not knowing it is worse!!  Colon cancer so often starts as a polyp that can be seen and removed at colonscopy, which can quite literally save your life!  And if your first colonoscopy is normal and you have no family history of colon cancer, most women don't have to have it again for 10 years.  Once every ten years, you can do something that may end up saving your life, right?  We will be happy to help you get it scheduled when you are ready.    Be sure to check your insurance coverage so you understand how much it will cost.  It may be covered as a preventative service at no cost, but you should check your particular policy.      Migraine Headache A migraine headache is a very strong throbbing pain on one side or both sides of your head. Migraines can also cause other symptoms. Talk with your doctor about what things may bring on (trigger) your migraine headaches. Follow these instructions at home: Medicines  Take over-the-counter and prescription medicines only as told by your doctor.  Do not  drive or use heavy machinery while taking prescription pain medicine.  To prevent or treat constipation while you are taking prescription pain medicine, your doctor may recommend that you: ? Drink enough fluid to keep your pee (urine) clear or pale yellow. ? Take over-the-counter or prescription medicines. ? Eat foods that are high in fiber. These include fresh fruits and vegetables, whole grains, and beans. ? Limit foods that are high in fat and processed sugars. These include fried and sweet foods. Lifestyle  Avoid alcohol.  Do not use any products that contain nicotine or tobacco, such as cigarettes and e-cigarettes. If you need help quitting, ask your doctor.  Get at least 8 hours of sleep every night.  Limit your stress. General instructions   Keep a journal to find out what may bring on your migraines. For example, write down: ? What you eat and drink. ? How much sleep you get. ? Any change in what you eat or drink. ? Any change in your medicines.  If you have a migraine: ? Avoid things that make your symptoms worse, such as bright lights. ? It may help to lie down in a dark, quiet room. ? Do not drive or use heavy machinery. ? Ask your doctor what activities are safe for you.  Keep all follow-up visits as told by your doctor. This is important. Contact a doctor if:  You get a migraine that is different or worse than your usual migraines. Get help right away if:  Your migraine gets very bad.  You have a fever.  You have a stiff neck.  You have trouble seeing.  Your muscles feel weak or like you cannot control them.  You start to lose your balance a lot.  You start to have trouble walking.  You pass out (faint). This information is not intended to replace advice given to you by your health care provider. Make sure you discuss any questions you have with your health care provider. Document Released: 07/17/2008 Document Revised: 04/27/2016 Document Reviewed:  03/26/2016 Elsevier Interactive Patient Education  2018 [employer].

## 2018-03-14 LAB — COMPREHENSIVE METABOLIC PANEL
ALBUMIN: 4.7 g/dL (ref 3.5–5.5)
ALT: 10 IU/L (ref 0–32)
AST: 14 IU/L (ref 0–40)
Albumin/Globulin Ratio: 1.7 (ref 1.2–2.2)
Alkaline Phosphatase: 49 IU/L (ref 39–117)
BILIRUBIN TOTAL: 0.3 mg/dL (ref 0.0–1.2)
BUN / CREAT RATIO: 15 (ref 9–23)
BUN: 11 mg/dL (ref 6–24)
CHLORIDE: 104 mmol/L (ref 96–106)
CO2: 23 mmol/L (ref 20–29)
CREATININE: 0.74 mg/dL (ref 0.57–1.00)
Calcium: 9 mg/dL (ref 8.7–10.2)
GFR calc Af Amer: 112 mL/min/{1.73_m2} (ref >59)
GFR calc non Af Amer: 97 mL/min/{1.73_m2} (ref >59)
GLOBULIN, TOTAL: 2.7 g/dL (ref 1.5–4.5)
GLUCOSE: 87 mg/dL (ref 65–99)
Potassium: 4.1 mmol/L (ref 3.5–5.2)
SODIUM: 140 mmol/L (ref 134–144)
Total Protein: 7.4 g/dL (ref 6.0–8.5)

## 2018-03-14 LAB — CBC
HEMATOCRIT: 37.4 % (ref 34.0–46.6)
Hemoglobin: 11.5 g/dL (ref 11.1–15.9)
MCH: 25.6 pg — ABNORMAL LOW (ref 26.6–33.0)
MCHC: 30.7 g/dL — ABNORMAL LOW (ref 31.5–35.7)
MCV: 83 fL (ref 79–97)
Platelets: 303 10*3/uL (ref 150–450)
RBC: 4.5 x10E6/uL (ref 3.77–5.28)
RDW: 15.8 % — AB (ref 12.3–15.4)
WBC: 5.7 10*3/uL (ref 3.4–10.8)

## 2018-03-14 LAB — TSH: TSH: 0.849 u[IU]/mL (ref 0.450–4.500)

## 2018-03-14 LAB — LIPID PANEL
CHOLESTEROL TOTAL: 160 mg/dL (ref 100–199)
Chol/HDL Ratio: 2.4 ratio (ref 0.0–4.4)
HDL: 68 mg/dL (ref >39)
LDL Calculated: 83 mg/dL (ref 0–99)
Triglycerides: 45 mg/dL (ref 0–149)
VLDL CHOLESTEROL CAL: 9 mg/dL (ref 5–40)

## 2018-03-14 LAB — VITAMIN D 25 HYDROXY (VIT D DEFICIENCY, FRACTURES): VIT D 25 HYDROXY: 27.3 ng/mL — AB (ref 30.0–100.0)

## 2018-03-18 LAB — CYTOLOGY - PAP: DIAGNOSIS: NEGATIVE

## 2018-04-01 ENCOUNTER — Encounter (INDEPENDENT_AMBULATORY_CARE_PROVIDER_SITE_OTHER): Payer: Self-pay | Admitting: Internal Medicine

## 2018-04-01 ENCOUNTER — Ambulatory Visit (INDEPENDENT_AMBULATORY_CARE_PROVIDER_SITE_OTHER): Payer: 59 | Admitting: Internal Medicine

## 2018-04-01 VITALS — BP 124/80 | HR 76 | Temp 97.9°F | Ht 62.0 in | Wt 152.0 lb

## 2018-04-01 DIAGNOSIS — R197 Diarrhea, unspecified: Secondary | ICD-10-CM | POA: Diagnosis not present

## 2018-04-01 NOTE — Patient Instructions (Signed)
OV in 6 months. Continue the Dicyclomine.

## 2018-04-01 NOTE — Progress Notes (Signed)
Subjective:    Patient ID: Samantha Wang, female    DOB: Mar 21, 1971, 47 y.o.   MRN: [id number] Wt in November 2018 141.  HPI  Here today for f/u. Last seen in November, 2018 by Dr. Karilyn Cota.  Hx of nonhealing PUD.  07/19/2017 EGD: Normal esophagus. Patient billroth I gastroduodenostomy. Normal cardia and gastric fundus. Normal second portion of duodenum and 3rd part of duodenum. Negative for celiac  In 2017 she was noted to have developed a very large prepyloric ulcer with deformity to pylorus. She underwent a antrectomy and truncal vagotomy by Dr. Deveron Furlong of [facility] in November 2017.  Since then she has developed diarrhea with urgency felt to be due to dumping syndrome and IBS.    She also developed iron deficiency anemia.   She tells me she is about the same. She says sometimes she feels a knot in her epigastric region.  She is having multiple stools during the day. She may 3-5 stools a day and sometimes she will not have one. No hx of constipation. She does have urgency and takes Dicyclomine for this.  Her appetite is good. She has gained 11 pounds since her last visit.  She states she has been under a lot of stress. Recently lost her mother to brain cancer.      .  Colonoscopy in 2018   was incomplete and followed by virtual colonoscopy.      Review of Systems Past Medical History:  Diagnosis Date  . Allergy   . Anemia   . Anxiety   . Breast nodule    left   . GERD (gastroesophageal reflux disease)   . H/O cesarean section    history of 2 c-sections    . History of hiatal hernia   . Peptic ulcer disease   . PONV (postoperative nausea and vomiting)   . Reflux     Past Surgical History:  Procedure Laterality Date  . BIOPSY  07/19/2017   Procedure: BIOPSY;  Surgeon: Malissa Hippo, MD;  [facility];  Service: Endoscopy;;  duodenum  . CESAREAN SECTION  [id number]  . CESAREAN SECTION     x2  . COLONOSCOPY N/A 07/19/2017   Procedure: COLONOSCOPY;  Surgeon:  Malissa Hippo, MD;  [facility];  Service: Endoscopy;  Laterality: N/A;  . ESOPHAGOGASTRODUODENOSCOPY N/A 10/11/2015   Procedure: ESOPHAGOGASTRODUODENOSCOPY (EGD);  Surgeon: Malissa Hippo, MD;  [facility];  Service: Endoscopy;  Laterality: N/A;  1230  . ESOPHAGOGASTRODUODENOSCOPY N/A 06/06/2016   Procedure: ESOPHAGOGASTRODUODENOSCOPY (EGD);  Surgeon: Malissa Hippo, MD;  [facility];  Service: Endoscopy;  Laterality: N/A;  255  . ESOPHAGOGASTRODUODENOSCOPY N/A 07/19/2017   Procedure: ESOPHAGOGASTRODUODENOSCOPY (EGD);  Surgeon: Malissa Hippo, MD;  [facility];  Service: Endoscopy;  Laterality: N/A;  8:25  . ulcer surgery  08/2016   abdominal  . UPPER GASTROINTESTINAL ENDOSCOPY      No Known Allergies  Current Outpatient Medications on File Prior to Visit  Medication Sig Dispense Refill  . clonazePAM (KLONOPIN) 0.5 MG tablet 1/2 to 1 po BID prn anxiety 30 tablet 5  . dicyclomine (BENTYL) 10 MG capsule Take 1 capsule (10 mg total) by mouth 3 (three) times daily before meals. (Patient taking differently: Take 10 mg by mouth 3 (three) times daily with meals as needed. ) 90 capsule 5  . flintstones complete (FLINTSTONES) 60 MG chewable tablet Chew 1 tablet by mouth daily. With iron    .  fluticasone (FLONASE) 50 MCG/ACT nasal spray USE TWO SPRAY(S) IN EACH NOSTRIL ONCE DAILY AS NEEDED FOR CONGESTION --  **NEEDS  OFFICE  VISIT** 16 g 5  . loratadine (CLARITIN) 10 MG tablet Take 10 mg by mouth daily as needed for allergies.      No current facility-administered medications on file prior to visit.         Objective:   Physical Exam Blood pressure 124/80, pulse 76, temperature 97.9 F (36.6 C), height 5\' 2"  (1.575 m), weight 152 lb (68.9 kg). Alert and oriented. Skin warm and dry. Oral mucosa is moist.   . Sclera anicteric, conjunctivae is pink. Thyroid not enlarged. No cervical lymphadenopathy. Lungs clear. Heart regular rate and rhythm.   Abdomen is soft. Bowel sounds are positive. No hepatomegaly. No abdominal masses felt. No tenderness.  No edema to lower extremities.          Assessment & Plan:  Diarrhea. She will continue Dicyclomine.  She will have OV in 6 months.

## 2018-05-14 DIAGNOSIS — G43019 Migraine without aura, intractable, without status migrainosus: Secondary | ICD-10-CM | POA: Diagnosis not present

## 2018-05-14 DIAGNOSIS — G43909 Migraine, unspecified, not intractable, without status migrainosus: Secondary | ICD-10-CM | POA: Diagnosis not present

## 2018-05-14 DIAGNOSIS — Z049 Encounter for examination and observation for unspecified reason: Secondary | ICD-10-CM | POA: Diagnosis not present

## 2018-05-14 DIAGNOSIS — R51 Headache: Secondary | ICD-10-CM | POA: Diagnosis not present

## 2018-05-14 DIAGNOSIS — Z79899 Other long term (current) drug therapy: Secondary | ICD-10-CM | POA: Diagnosis not present

## 2018-06-19 ENCOUNTER — Other Ambulatory Visit: Payer: Self-pay | Admitting: Certified Nurse Midwife

## 2018-06-19 DIAGNOSIS — Z1231 Encounter for screening mammogram for malignant neoplasm of breast: Secondary | ICD-10-CM

## 2018-07-09 DIAGNOSIS — G43019 Migraine without aura, intractable, without status migrainosus: Secondary | ICD-10-CM | POA: Diagnosis not present

## 2018-07-09 DIAGNOSIS — G43909 Migraine, unspecified, not intractable, without status migrainosus: Secondary | ICD-10-CM | POA: Diagnosis not present

## 2018-07-09 DIAGNOSIS — R51 Headache: Secondary | ICD-10-CM | POA: Diagnosis not present

## 2018-07-16 ENCOUNTER — Ambulatory Visit
Admission: RE | Admit: 2018-07-16 | Discharge: 2018-07-16 | Disposition: A | Discharge status: Home / Self Care | Payer: 59 | Source: Ambulatory Visit | Attending: Certified Nurse Midwife | Admitting: Certified Nurse Midwife

## 2018-07-16 DIAGNOSIS — Z1231 Encounter for screening mammogram for malignant neoplasm of breast: Secondary | ICD-10-CM

## 2018-08-04 ENCOUNTER — Ambulatory Visit: Payer: 59 | Admitting: Family Medicine

## 2018-08-04 ENCOUNTER — Encounter: Payer: Self-pay | Admitting: Family Medicine

## 2018-08-04 VITALS — BP 138/90 | Ht 62.0 in | Wt 154.0 lb

## 2018-08-04 DIAGNOSIS — F411 Generalized anxiety disorder: Secondary | ICD-10-CM | POA: Diagnosis not present

## 2018-08-04 DIAGNOSIS — Z23 Encounter for immunization: Secondary | ICD-10-CM

## 2018-08-04 DIAGNOSIS — K253 Acute gastric ulcer without hemorrhage or perforation: Secondary | ICD-10-CM

## 2018-08-04 DIAGNOSIS — R109 Unspecified abdominal pain: Secondary | ICD-10-CM | POA: Diagnosis not present

## 2018-08-04 DIAGNOSIS — F5101 Primary insomnia: Secondary | ICD-10-CM | POA: Diagnosis not present

## 2018-08-04 DIAGNOSIS — K529 Noninfective gastroenteritis and colitis, unspecified: Secondary | ICD-10-CM | POA: Diagnosis not present

## 2018-08-04 MED ORDER — CLONAZEPAM 0.5 MG PO TABS: ORAL_TABLET | ORAL | 5 refills | Status: DC

## 2018-08-04 NOTE — Progress Notes (Signed)
Subjective:    Patient ID: Samantha Wang, female    DOB: 1970/11/03, 47 y.o.   MRN: [id number]  HPI Pt here today for yearly check on medications. PUD surgery helped pt a lot  stull having soe g I symtoms, still having loose stools   Dealing with that long term   Having to use the anxiety med more often, states using clonazepam for her 5 times per week.  Feeling down and some stress at times.  Stress.  Stress at home.    Extra stress with mothe rpasing   migr headaches has amped up more lately. Sees dr Neale Burly   Trying meds with them    morningn usually take s the meds to calm thing sdown    Patient frustrated regarding her GI status.  Ongoing loose stools.  Ongoing substantial IBS issues.  Had peptic ulcer disease surgery which helped the ulcers.  However has left her with persistent loose stools and cramping.  Does dicyclomine helps a bit but not a lot.  Feels her current GI doctors have reached an impasse in her care   Review of Systems , no major weight loss or weight gain, no chest pain no back pain a no change in bowel habits complete ROS otherwise negative     Objective:   Physical Exam Alert and oriented, vitals reviewed and stable, NAD ENT-TM's and ext canals WNL bilat via otoscopic exam Soft palate, tonsils and post pharynx WNL via oropharyngeal exam Neck-symmetric, no masses; thyroid nonpalpable and nontender Pulmonary-no tachypnea or accessory muscle use; Clear without wheezes via auscultation Card--no abnrml murmurs, rhythm reg and rate WNL Carotid pulses symmetric, without bruits    Impression chronic anxiety with element of depression.  Long discussion held.  Patient not inclined to take daily agent.  Would prefer to use PRN clonazepam  2.  Chronic GI difficulties.  Primarily IBS-like in nature.  Patient wishes she could get another GIs input.  Long discussion held.  Patient requests that we processes  Flu shot today discussed  Greater than 50% of  this 25 minute face to face visit was spent in counseling and discussion and coordination of care regarding the above diagnosis/diagnosies        Assessment & Plan:

## 2018-08-06 ENCOUNTER — Telehealth: Payer: Self-pay | Admitting: Gastroenterology

## 2018-08-06 NOTE — Telephone Encounter (Signed)
Patient wants to switch from Dr. Karilyn Cota to one of our GI doctors ([facility] Dr Barron Alvine) records in chart from Dr Karilyn Cota, Do you accept patient?

## 2018-08-06 NOTE — Telephone Encounter (Signed)
Yes.  Thank you.

## 2018-08-13 DIAGNOSIS — H40013 Open angle with borderline findings, low risk, bilateral: Secondary | ICD-10-CM | POA: Diagnosis not present

## 2018-08-29 ENCOUNTER — Encounter: Payer: Self-pay | Admitting: Family Medicine

## 2018-09-05 ENCOUNTER — Encounter: Payer: Self-pay | Admitting: Family Medicine

## 2018-09-23 ENCOUNTER — Telehealth: Payer: Self-pay | Admitting: Family Medicine

## 2018-09-23 NOTE — Telephone Encounter (Signed)
Pt called, LMOM for me  [facility].  States that [facility] basically couldn't schedule [place] at the [place] location  Will call pt to clarify & go from there   Pt Wk# [phone], Cell# [phone]

## 2018-09-25 DIAGNOSIS — G43019 Migraine without aura, intractable, without status migrainosus: Secondary | ICD-10-CM | POA: Diagnosis not present

## 2018-09-25 DIAGNOSIS — R51 Headache: Secondary | ICD-10-CM | POA: Diagnosis not present

## 2018-09-25 DIAGNOSIS — G43909 Migraine, unspecified, not intractable, without status migrainosus: Secondary | ICD-10-CM | POA: Diagnosis not present

## 2018-09-30 NOTE — Telephone Encounter (Signed)
[facility] for [facility], need to clarify & work on GI referral

## 2018-10-01 ENCOUNTER — Ambulatory Visit (INDEPENDENT_AMBULATORY_CARE_PROVIDER_SITE_OTHER): Payer: 59 | Admitting: Internal Medicine

## 2018-10-29 ENCOUNTER — Encounter: Payer: Self-pay | Admitting: Family Medicine

## 2018-11-18 DIAGNOSIS — Z79899 Other long term (current) drug therapy: Secondary | ICD-10-CM | POA: Diagnosis not present

## 2018-11-18 DIAGNOSIS — K580 Irritable bowel syndrome with diarrhea: Secondary | ICD-10-CM | POA: Diagnosis not present

## 2018-11-18 DIAGNOSIS — K219 Gastro-esophageal reflux disease without esophagitis: Secondary | ICD-10-CM | POA: Diagnosis not present

## 2018-12-01 DIAGNOSIS — K529 Noninfective gastroenteritis and colitis, unspecified: Secondary | ICD-10-CM | POA: Diagnosis not present

## 2018-12-09 DIAGNOSIS — K529 Noninfective gastroenteritis and colitis, unspecified: Secondary | ICD-10-CM | POA: Diagnosis not present

## 2018-12-15 DIAGNOSIS — K529 Noninfective gastroenteritis and colitis, unspecified: Secondary | ICD-10-CM | POA: Diagnosis not present

## 2018-12-15 DIAGNOSIS — A049 Bacterial intestinal infection, unspecified: Secondary | ICD-10-CM | POA: Diagnosis not present

## 2018-12-23 ENCOUNTER — Ambulatory Visit (INDEPENDENT_AMBULATORY_CARE_PROVIDER_SITE_OTHER): Payer: 59 | Admitting: Internal Medicine

## 2019-02-03 ENCOUNTER — Telehealth: Payer: Self-pay | Admitting: Certified Nurse Midwife

## 2019-02-03 NOTE — Telephone Encounter (Signed)
Patient believes she has yeast infection. Has tried over the counter and helps for about a week before the infection returns. Would like prescription called in so she does not have to visit the office. Patient aware she will probably need to be seen.

## 2019-02-03 NOTE — Telephone Encounter (Signed)
Spoke with patient. Patient states that she has had vaginal discharge and itching for almost a month. Has used Monistat off and on for a month now. Most recently completed 7 day course with relief. Symptoms have now returned. Advised will need OV for further evaluation. Patient is agreeable. Appointment scheduled for tomorrow 02/04/2019 at 11:30 am with Dr.Silva. Patient is agreeable to date and time.  Routing to provider and will close encounter.

## 2019-02-04 ENCOUNTER — Encounter: Payer: Self-pay | Admitting: Obstetrics and Gynecology

## 2019-02-04 ENCOUNTER — Other Ambulatory Visit: Payer: Self-pay

## 2019-02-04 ENCOUNTER — Ambulatory Visit: Payer: 59 | Admitting: Obstetrics and Gynecology

## 2019-02-04 VITALS — BP 110/80 | HR 76 | Temp 98.2°F | Ht 62.0 in | Wt 155.0 lb

## 2019-02-04 DIAGNOSIS — N760 Acute vaginitis: Secondary | ICD-10-CM | POA: Diagnosis not present

## 2019-02-04 MED ORDER — NYSTATIN-TRIAMCINOLONE 100000-0.1 UNIT/GM-% EX CREA: 1.0000 "application " | TOPICAL_CREAM | Freq: Two times a day (BID) | CUTANEOUS | 0 refills | Status: DC

## 2019-02-04 NOTE — Progress Notes (Signed)
GYNECOLOGY  VISIT   HPI: 48 y.o.   Married  Caucasian  female   G2P2002 with Patient's last menstrual period was 01/16/2019 (exact date).   here for vaginal itching and irritation on and off since January.     Thinks she has a yeast infection.   Starting new work out pants recently and then started having itching and burning in her buttock area.  Used [employer] medicated powder and then athletes foot cream.   Now noticing symptoms in the front area.  Tried Monistat, and this helped somewhat.   Has been going to [facility] and treating for bacterial overgrowth in her GI tract.  She took abx with Ciprofloxacin, and thought her vaginitis symptoms got worse.  She did Monistat 7 and got some improvement.   Menses are not quiet as regular.  Can be heavy or night.   No real change in her personal product use.   GYNECOLOGIC HISTORY: Patient's last menstrual period was 01/16/2019 (exact date). Contraception:  Vasectomy  Menopausal hormone therapy:  none Last mammogram:  07/16/18 BIRADS1:Neg  Last pap smear:   03/13/18 Neg        OB History    Gravida  2   Para  2   Term  2   Preterm      AB      Living  2     SAB      TAB      Ectopic      Multiple      Live Births                 Patient Active Problem List   Diagnosis Date Noted  . Generalized abdominal pain 05/29/2017  . Absolute anemia 05/29/2017  . Diarrhea 05/29/2017  . Acute gastric ulcer without hemorrhage or perforation 07/13/2016  . Generalized anxiety disorder 08/27/2015  . Elevated blood pressure 05/25/2015  . Depression 04/18/2015  . Fatigue due to depression 04/18/2015  . Insomnia 06/23/2014  . Menstrual migraine 04/20/2014    Class: History of  . RUQ abdominal pain 08/21/2013  . Anxiety 06/10/2013  . GERD (gastroesophageal reflux disease) 10/06/2012  . PUD (peptic ulcer disease) 10/06/2012  . IBS (irritable bowel syndrome) 10/06/2012  . Stress 10/06/2012    Past Medical History:   Diagnosis Date  . Allergy   . Anemia   . Anxiety   . Breast nodule    left   . GERD (gastroesophageal reflux disease)   . H/O cesarean section    history of 2 c-sections    . History of hiatal hernia   . Peptic ulcer disease   . PONV (postoperative nausea and vomiting)   . Reflux     Past Surgical History:  Procedure Laterality Date  . BIOPSY  07/19/2017   Procedure: BIOPSY;  Surgeon: Malissa Hippo, MD;  [facility];  Service: Endoscopy;;  duodenum  . CESAREAN SECTION  [id number]  . CESAREAN SECTION     x2  . COLONOSCOPY N/A 07/19/2017   Procedure: COLONOSCOPY;  Surgeon: Malissa Hippo, MD;  [facility];  Service: Endoscopy;  Laterality: N/A;  . ESOPHAGOGASTRODUODENOSCOPY N/A 10/11/2015   Procedure: ESOPHAGOGASTRODUODENOSCOPY (EGD);  Surgeon: Malissa Hippo, MD;  [facility];  Service: Endoscopy;  Laterality: N/A;  1230  . ESOPHAGOGASTRODUODENOSCOPY N/A 06/06/2016   Procedure: ESOPHAGOGASTRODUODENOSCOPY (EGD);  Surgeon: Malissa Hippo, MD;  [facility];  Service: Endoscopy;  Laterality: N/A;  255  .  ESOPHAGOGASTRODUODENOSCOPY N/A 07/19/2017   Procedure: ESOPHAGOGASTRODUODENOSCOPY (EGD);  Surgeon: Malissa Hippo, MD;  [facility];  Service: Endoscopy;  Laterality: N/A;  8:25  . ulcer surgery  08/2016   abdominal  . UPPER GASTROINTESTINAL ENDOSCOPY      Current Outpatient Medications  Medication Sig Dispense Refill  . baclofen (LIORESAL) 10 MG tablet TAKE 1 TABLET BY MOUTH TWICE DAILY AS NEEDED FOR HEADACHE. AVOID DAILY USE. LIMIT USE TO 1 2 DAYS PER WEEK.  0  . clonazePAM (KLONOPIN) 0.5 MG tablet 1/2 to 1 po BID prn anxiety 30 tablet 5  . flintstones complete (FLINTSTONES) 60 MG chewable tablet Chew 1 tablet by mouth daily. With iron    . fluticasone (FLONASE) 50 MCG/ACT nasal spray USE TWO SPRAY(S) IN EACH NOSTRIL ONCE DAILY AS NEEDED FOR CONGESTION --  **NEEDS  OFFICE  VISIT** 16 g 5  . Loperamide-Simethicone  (IMODIUM MULTI-SYMPTOM RELIEF PO) Take by mouth daily as needed.    . loratadine (CLARITIN) 10 MG tablet Take 10 mg by mouth daily as needed for allergies.     . SUMAtriptan (IMITREX) 100 MG tablet TAKE 1 TABLET BY MOUTH AS NEEDED FOR MIGRAINE. MAY REPEAT ONCE AFTER 2 HOURS  2   No current facility-administered medications for this visit.      ALLERGIES: Patient has no known allergies.  Family History  Problem Relation Age of Onset  . Healthy Mother   . Hyperlipidemia Father   . Healthy Brother   . Healthy Son   . Healthy Daughter   . Breast cancer Maternal Grandmother     Social History   Socioeconomic History  . Marital status: Married    Spouse name: Not on file  . Number of children: Not on file  . Years of education: Not on file  . Highest education level: Not on file  Occupational History  . Not on file  Social Needs  . Financial resource strain: Not on file  . Food insecurity:    Worry: Not on file    Inability: Not on file  . Transportation needs:    Medical: Not on file    Non-medical: Not on file  Tobacco Use  . Smoking status: Never Smoker  . Smokeless tobacco: Never Used  Substance and Sexual Activity  . Alcohol use: Yes    Alcohol/week: 1.0 - 2.0 standard drinks    Types: 1 - 2 Standard drinks or equivalent per week  . Drug use: No  . Sexual activity: Yes    Partners: Male    Birth control/protection: Other-see comments    Comment: spouse-vasectomy   Lifestyle  . Physical activity:    Days per week: Not on file    Minutes per session: Not on file  . Stress: Not on file  Relationships  . Social connections:    Talks on phone: Not on file    Gets together: Not on file    Attends religious service: Not on file    Active member of club or organization: Not on file    Attends meetings of clubs or organizations: Not on file    Relationship status: Not on file  . Intimate partner violence:    Fear of current or ex partner: Not on file     Emotionally abused: Not on file    Physically abused: Not on file    Forced sexual activity: Not on file  Other Topics Concern  . Not on file  Social History Narrative  .  Not on file    Review of Systems  Genitourinary:       Vaginal irritation   All other systems reviewed and are negative.   PHYSICAL EXAMINATION:    BP 110/80   Pulse 76   Temp 98.2 F (36.8 C) (Oral)   Ht 5\' 2"  (1.575 m)   Wt 155 lb (70.3 kg)   LMP 01/16/2019 (Exact Date)   BMI 28.35 kg/m     General appearance: alert, cooperative and appears stated age   Pelvic: External genitalia:  Pinkish coloration of the vulvar skin.              Urethra:  normal appearing urethra with no masses, tenderness or lesions              Bartholins and Skenes: normal                 Vagina: normal appearing vagina with normal color and discharge, no lesions              Cervix: no lesions                Bimanual Exam:  Uterus:  normal size, contour, position, consistency, mobility, non-tender              Adnexa: no mass, fullness, tenderness           Chaperone was present for exam.  ASSESSMENT  Vulvovaginitis.  I suspect at least yeast.  PLAN  We talked about the various forms of vaginitis including atrophic vaginitis.  Affirm. Mycolog II cream bid x 1 week.  An additional tx to follow results of Affirm.  Probiotics suggested.    An After Visit Summary was printed and given to the patient.  __15____ minutes face to face time of which over 50% was spent in counseling.

## 2019-02-05 LAB — VAGINITIS/VAGINOSIS, DNA PROBE
Candida Species: NEGATIVE
Gardnerella vaginalis: NEGATIVE
Trichomonas vaginosis: NEGATIVE

## 2019-02-06 ENCOUNTER — Telehealth: Payer: Self-pay

## 2019-02-06 MED ORDER — FLUCONAZOLE 150 MG PO TABS: 150.0000 mg | ORAL_TABLET | Freq: Once | ORAL | 0 refills | Status: AC

## 2019-02-06 NOTE — Telephone Encounter (Signed)
-----   Message from Patton Salles, MD sent at 02/05/2019 11:15 AM EDT ----- Please contact patient with results of testing which show no internal vaginal infection.  She can use the Mycolog II for external irritation.  I will agree to give her a prescription for Diflucan 150 mg po x 1 with repeat in 72 hours if needed.  I am doing this as she reported she got some partial relief from Monistat.   If her symptoms persist, I would have her return for evaluation and treatment of vagina atrophy, which can mimic yeast infection.

## 2019-02-06 NOTE — Telephone Encounter (Signed)
Spoke with patient. Results given. Patient verbalizes understanding. Rx for Diflucan 150 mg po x 1 repeat in 72 hours if needed #2 0RF sent to pharmacy on file. Advised if symptoms persist Dr.Silva recommends she return to the office for evaluation and treatment of vaginal atrophy. Patient is agreeable. Encounter closed.

## 2019-03-17 ENCOUNTER — Ambulatory Visit: Payer: 59 | Admitting: Certified Nurse Midwife

## 2019-06-02 ENCOUNTER — Other Ambulatory Visit: Payer: Self-pay | Admitting: *Deleted

## 2019-06-02 DIAGNOSIS — Z20822 Contact with and (suspected) exposure to covid-19: Secondary | ICD-10-CM

## 2019-06-03 LAB — NOVEL CORONAVIRUS, NAA: SARS-CoV-2, NAA: NOT DETECTED

## 2019-07-09 ENCOUNTER — Other Ambulatory Visit: Payer: Self-pay

## 2019-07-13 ENCOUNTER — Ambulatory Visit: Payer: 59 | Admitting: Certified Nurse Midwife

## 2019-07-27 ENCOUNTER — Other Ambulatory Visit: Payer: Self-pay

## 2019-07-29 ENCOUNTER — Encounter: Payer: Self-pay | Admitting: Certified Nurse Midwife

## 2019-07-29 ENCOUNTER — Ambulatory Visit: Payer: 59 | Admitting: Certified Nurse Midwife

## 2019-07-29 ENCOUNTER — Other Ambulatory Visit: Payer: Self-pay

## 2019-07-29 VITALS — BP 114/68 | HR 64 | Temp 97.0°F | Resp 16 | Ht 62.75 in | Wt 153.0 lb

## 2019-07-29 DIAGNOSIS — E559 Vitamin D deficiency, unspecified: Secondary | ICD-10-CM | POA: Diagnosis not present

## 2019-07-29 DIAGNOSIS — Z0000 Encounter for general adult medical examination without abnormal findings: Secondary | ICD-10-CM

## 2019-07-29 DIAGNOSIS — Z01419 Encounter for gynecological examination (general) (routine) without abnormal findings: Secondary | ICD-10-CM | POA: Diagnosis not present

## 2019-07-29 NOTE — Progress Notes (Signed)
48 y.o. 542P2002 Married  Caucasian Fe here for annual exam. Periods changing with occasional missed period, not as long as 3 month. Some vaginal dryness and still has Rx for use. No issues today. Menstrual migraines still occurs, was seen by [facility], was given Rx which has helped. Has not seen Dr. Gerda DissLuking this year, requests labs here. No other health issues today.  Patient's last menstrual period was 07/11/2019 (exact date).          Sexually active: Yes.    The current method of family planning is vasectomy.    Exercising: Yes.    walking Smoker:  no  Review of Systems  Constitutional: Negative.   HENT: Negative.   Eyes: Negative.   Respiratory: Negative.   Cardiovascular: Negative.   Gastrointestinal: Negative.   Genitourinary: Negative.   Musculoskeletal: Negative.   Skin:       Vaginal dryness & itching  Neurological: Negative.   Endo/Heme/Allergies: Negative.   Psychiatric/Behavioral: Negative.     Health Maintenance: Pap:  03-08-16 neg HPV HR neg, 03-13-18 neg History of Abnormal Pap: no MMG:  07-16-18 category c density birads 1:neg Self Breast exams: yes Colonoscopy:  2018 virtual BMD:  2016 TDaP: 2011 Shingles: not done Pneumonia: not done Hep C and HIV: hep c neg 2017 Labs: yes   reports that she has never smoked. She has never used smokeless tobacco. She reports current alcohol use. She reports that she does not use drugs.  Past Medical History:  Diagnosis Date  . Allergy   . Anemia   . Anxiety   . Breast nodule    left   . GERD (gastroesophageal reflux disease)   . H/O cesarean section    history of 2 c-sections    . History of hiatal hernia   . Peptic ulcer disease   . PONV (postoperative nausea and vomiting)   . Reflux     Past Surgical History:  Procedure Laterality Date  . BIOPSY  07/19/2017   Procedure: BIOPSY;  Surgeon: Malissa Hippoehman, Najeeb U, MD;  [facility]: [facility];  Service: Endoscopy;;  duodenum  . CESAREAN SECTION  [id number]   . CESAREAN SECTION     x2  . COLONOSCOPY N/A 07/19/2017   Procedure: COLONOSCOPY;  Surgeon: Malissa Hippoehman, Najeeb U, MD;  Location: [facility];  Service: Endoscopy;  Laterality: N/A;  . ESOPHAGOGASTRODUODENOSCOPY N/A 10/11/2015   Procedure: ESOPHAGOGASTRODUODENOSCOPY (EGD);  Surgeon: Malissa HippoNajeeb U Rehman, MD;  Location: [facility];  Service: Endoscopy;  Laterality: N/A;  1230  . ESOPHAGOGASTRODUODENOSCOPY N/A 06/06/2016   Procedure: ESOPHAGOGASTRODUODENOSCOPY (EGD);  Surgeon: Malissa HippoNajeeb U Rehman, MD;  [facility]: [facility];  Service: Endoscopy;  Laterality: N/A;  255  . ESOPHAGOGASTRODUODENOSCOPY N/A 07/19/2017   Procedure: ESOPHAGOGASTRODUODENOSCOPY (EGD);  Surgeon: Malissa Hippoehman, Najeeb U, MD;  [facility]: [facility];  Service: Endoscopy;  Laterality: N/A;  8:25  . ulcer surgery  08/2016   abdominal  . UPPER GASTROINTESTINAL ENDOSCOPY      Current Outpatient Medications  Medication Sig Dispense Refill  . baclofen (LIORESAL) 10 MG tablet TAKE 1 TABLET BY MOUTH TWICE DAILY AS NEEDED FOR HEADACHE. AVOID DAILY USE. LIMIT USE TO 1 2 DAYS PER WEEK.  0  . clonazePAM (KLONOPIN) 0.5 MG tablet 1/2 to 1 po BID prn anxiety 30 tablet 5  . fluticasone (FLONASE) 50 MCG/ACT nasal spray USE TWO SPRAY(S) IN EACH NOSTRIL ONCE DAILY AS NEEDED FOR CONGESTION --  **NEEDS  OFFICE  VISIT** 16 g 5  . loratadine (CLARITIN)  10 MG tablet Take 10 mg by mouth daily as needed for allergies.     Marland Kitchen nystatin-triamcinolone (MYCOLOG II) cream Apply 1 application topically 2 (two) times daily. Apply to affected area BID for up to 7 days. 60 g 0  . SUMAtriptan (IMITREX) 100 MG tablet TAKE 1 TABLET BY MOUTH AS NEEDED FOR MIGRAINE. MAY REPEAT ONCE AFTER 2 HOURS  2   No current facility-administered medications for this visit.     Family History  Problem Relation Age of Onset  . Healthy Mother   . Hyperlipidemia Father   . Healthy Brother   . Healthy Son   . Healthy Daughter   . Breast cancer Maternal Grandmother     ROS:  Pertinent  items are noted in HPI.  Otherwise, a comprehensive ROS was negative.  Exam:   BP 114/68   Pulse 64   Temp (!) 97 F (36.1 C) (Skin)   Resp 16   Ht 5' 2.75" (1.594 m)   Wt 153 lb (69.4 kg)   LMP 07/11/2019 (Exact Date)   BMI 27.32 kg/m  Height: 5' 2.75" (159.4 cm) Ht Readings from Last 3 Encounters:  07/29/19 5' 2.75" (1.594 m)  02/04/19 5\' 2"  (1.575 m)  08/04/18 5\' 2"  (1.575 m)    General appearance: alert, cooperative and appears stated age Head: Normocephalic, without obvious abnormality, atraumatic Neck: no adenopathy, supple, symmetrical, trachea midline and thyroid normal to inspection and palpation Lungs: clear to auscultation bilaterally Breasts: normal appearance, no masses or tenderness, No nipple retraction or dimpling, No nipple discharge or bleeding, No axillary or supraclavicular adenopathy Heart: regular rate and rhythm Abdomen: soft, non-tender; no masses,  no organomegaly Extremities: extremities normal, atraumatic, no cyanosis or edema Skin: Skin color, texture, turgor normal. No rashes or lesions Lymph nodes: Cervical, supraclavicular, and axillary nodes normal. No abnormal inguinal nodes palpated Neurologic: Grossly normal   Pelvic: External genitalia:  no lesions              Urethra:  normal appearing urethra with no masses, tenderness or lesions              Bartholin's and Skene's: normal                 Vagina: normal appearing vagina with normal color and discharge, no lesions              Cervix: multiparous appearance, no cervical motion tenderness and no lesions              Pap taken: No. Bimanual Exam:  Uterus:  normal size, contour, position, consistency, mobility, non-tender and anteverted              Adnexa: normal adnexa and no mass, fullness, tenderness               Rectovaginal: Confirms               Anus:  normal sphincter tone, no lesions  Chaperone present: yes  A:  Well Woman with normal exam  Contraception spouse  vasectomy  Migraine headaches no aura with [facility]  Vaginal dryness has Rx working well, no refill needed  Mammogram due    P:   Reviewed health and wellness pertinent to exam  Aware of period changes and needs to advise if no period in 3 months.  Continue follow up with MD as indicated.  Will advise if issues with vaginal dryness.  Will schedule mammogram  Pap smear:  no   counseled on breast self exam, mammography screening, feminine hygiene, menopause, adequate intake of calcium and vitamin D, diet and exercise  return annually or prn  An After Visit Summary was printed and given to the patient.

## 2019-07-29 NOTE — Patient Instructions (Signed)
EXERCISE AND DIET:  We recommended that you start or continue a regular exercise program for good health. Regular exercise means any activity that makes your heart beat faster and makes you sweat.  We recommend exercising at least 30 minutes per day at least 3 days a week, preferably 4 or 5.  We also recommend a diet low in fat and sugar.  Inactivity, poor dietary choices and obesity can cause diabetes, heart attack, stroke, and kidney damage, among others.   ° °ALCOHOL AND SMOKING:  Women should limit their alcohol intake to no more than 7 drinks/beers/glasses of wine (combined, not each!) per week. Moderation of alcohol intake to this level decreases your risk of breast cancer and liver damage. And of course, no recreational drugs are part of a healthy lifestyle.  And absolutely no smoking or even second hand smoke. Most people know smoking can cause heart and lung diseases, but did you know it also contributes to weakening of your bones? Aging of your skin?  Yellowing of your teeth and nails? ° °CALCIUM AND VITAMIN D:  Adequate intake of calcium and Vitamin D are recommended.  The recommendations for exact amounts of these supplements seem to change often, but generally speaking 600 mg of calcium (either carbonate or citrate) and 800 units of Vitamin D per day seems prudent. Certain women may benefit from higher intake of Vitamin D.  If you are among these women, your doctor will have told you during your visit.   ° °PAP SMEARS:  Pap smears, to check for cervical cancer or precancers,  have traditionally been done yearly, although recent scientific advances have shown that most women can have pap smears less often.  However, every woman still should have a physical exam from her gynecologist every year. It will include a breast check, inspection of the vulva and vagina to check for abnormal growths or skin changes, a visual exam of the cervix, and then an exam to evaluate the size and shape of the uterus and  ovaries.  And after 48 years of age, a rectal exam is indicated to check for rectal cancers. We will also provide age appropriate advice regarding health maintenance, like when you should have certain vaccines, screening for sexually transmitted diseases, bone density testing, colonoscopy, mammograms, etc.  ° °MAMMOGRAMS:  All women over 40 years old should have a yearly mammogram. Many facilities now offer a "3D" mammogram, which may cost around $50 extra out of pocket. If possible,  we recommend you accept the option to have the 3D mammogram performed.  It both reduces the number of women who will be called back for extra views which then turn out to be normal, and it is better than the routine mammogram at detecting truly abnormal areas.   ° °COLONOSCOPY:  Colonoscopy to screen for colon cancer is recommended for all women at age 50.  We know, you hate the idea of the prep.  We agree, BUT, having colon cancer and not knowing it is worse!!  Colon cancer so often starts as a polyp that can be seen and removed at colonscopy, which can quite literally save your life!  And if your first colonoscopy is normal and you have no family history of colon cancer, most women don't have to have it again for 10 years.  Once every ten years, you can do something that may end up saving your life, right?  We will be happy to help you get it scheduled when you are ready.    Be sure to check your insurance coverage so you understand how much it will cost.  It may be covered as a preventative service at no cost, but you should check your particular policy.   ° ° ° °Atrophic Vaginitis °Atrophic vaginitis is a condition in which the tissues that line the vagina become dry and thin. This condition occurs in women who have stopped having their period. It is caused by a drop in a female hormone (estrogen). This hormone helps: °· To keep the vagina moist. °· To make a clear fluid. This clear fluid helps: °? To make the vagina ready for  sex. °? To protect the vagina from infection. °If the lining of the vagina is dry and thin, it may cause irritation, burning, or itchiness. It may also: °· Make sex painful. °· Make an exam of your vagina painful. °· Cause bleeding. °· Make you lose interest in sex. °· Cause a burning feeling when you pee (urinate). °· Cause a brown or yellow fluid to come from your vagina. °Some women do not have symptoms. °Follow these instructions at home: °Medicines °· Take over-the-counter and prescription medicines only as told by your doctor. °· Do not use herbs or other medicines unless your doctor says it is okay. °· Use medicines for for dryness. These include: °? Oils to make the vagina soft. °? Creams. °? Moisturizers. °General instructions °· Do not douche. °· Do not use products that can make your vagina dry. These include: °? Scented sprays. °? Scented tampons. °? Scented soaps. °· Sex can help increase blood flow and soften the tissue in the vagina. If it hurts to have sex: °? Tell your partner. °? Use products to make sex more comfortable. Use these only as told by your doctor. °Contact a doctor if you: °· Have discharge from the vagina that is different than usual. °· Have a bad smell coming from your vagina. °· Have new symptoms. °· Do not get better. °· Get worse. °Summary °· Atrophic vaginitis is a condition in which the lining of the vagina becomes dry and thin. °· This condition affects women who have stopped having their periods. °· Treatment may include using products that help make the vagina soft. °· Call a doctor if do not get better with treatment. °This information is not intended to replace advice given to you by your health care provider. Make sure you discuss any questions you have with your health care provider. °Document Released: 03/26/2008 Document Revised: 10/21/2017 Document Reviewed: 10/21/2017 °Elsevier Patient Education © 2020 [employer] ° °

## 2019-07-30 LAB — COMPREHENSIVE METABOLIC PANEL
ALT: 10 IU/L (ref 0–32)
AST: 16 IU/L (ref 0–40)
Albumin/Globulin Ratio: 1.7 (ref 1.2–2.2)
Albumin: 4.4 g/dL (ref 3.8–4.8)
Alkaline Phosphatase: 54 IU/L (ref 39–117)
BUN/Creatinine Ratio: 19 (ref 9–23)
BUN: 12 mg/dL (ref 6–24)
Bilirubin Total: 0.2 mg/dL (ref 0.0–1.2)
CO2: 24 mmol/L (ref 20–29)
Calcium: 8.9 mg/dL (ref 8.7–10.2)
Chloride: 102 mmol/L (ref 96–106)
Creatinine, Ser: 0.63 mg/dL (ref 0.57–1.00)
GFR calc Af Amer: 123 mL/min/{1.73_m2} (ref >59)
GFR calc non Af Amer: 106 mL/min/{1.73_m2} (ref >59)
Globulin, Total: 2.6 g/dL (ref 1.5–4.5)
Glucose: 91 mg/dL (ref 65–99)
Potassium: 4.1 mmol/L (ref 3.5–5.2)
Sodium: 139 mmol/L (ref 134–144)
Total Protein: 7 g/dL (ref 6.0–8.5)

## 2019-07-30 LAB — CBC
Hematocrit: 32.4 % — ABNORMAL LOW (ref 34.0–46.6)
Hemoglobin: 9.8 g/dL — ABNORMAL LOW (ref 11.1–15.9)
MCH: 23.1 pg — ABNORMAL LOW (ref 26.6–33.0)
MCHC: 30.2 g/dL — ABNORMAL LOW (ref 31.5–35.7)
MCV: 76 fL — ABNORMAL LOW (ref 79–97)
Platelets: 313 10*3/uL (ref 150–450)
RBC: 4.24 x10E6/uL (ref 3.77–5.28)
RDW: 15.7 % — ABNORMAL HIGH (ref 11.7–15.4)
WBC: 4.6 10*3/uL (ref 3.4–10.8)

## 2019-07-30 LAB — VITAMIN D 25 HYDROXY (VIT D DEFICIENCY, FRACTURES): Vit D, 25-Hydroxy: 28.4 ng/mL — ABNORMAL LOW (ref 30.0–100.0)

## 2019-07-30 LAB — LIPID PANEL
Chol/HDL Ratio: 2.4 ratio (ref 0.0–4.4)
Cholesterol, Total: 165 mg/dL (ref 100–199)
HDL: 68 mg/dL (ref >39)
LDL Chol Calc (NIH): 87 mg/dL (ref 0–99)
Triglycerides: 48 mg/dL (ref 0–149)
VLDL Cholesterol Cal: 10 mg/dL (ref 5–40)

## 2019-07-30 LAB — TSH: TSH: 1.45 u[IU]/mL (ref 0.450–4.500)

## 2019-07-30 LAB — HEMOGLOBIN A1C
Est. average glucose Bld gHb Est-mCnc: 126 mg/dL
Hgb A1c MFr Bld: 6 % — ABNORMAL HIGH (ref 4.8–5.6)

## 2019-10-12 ENCOUNTER — Ambulatory Visit: Payer: 59 | Attending: Internal Medicine

## 2019-10-12 ENCOUNTER — Other Ambulatory Visit: Payer: Self-pay

## 2019-10-12 DIAGNOSIS — Z20822 Contact with and (suspected) exposure to covid-19: Secondary | ICD-10-CM

## 2019-10-13 LAB — NOVEL CORONAVIRUS, NAA: SARS-CoV-2, NAA: NOT DETECTED

## 2019-11-25 ENCOUNTER — Encounter: Payer: Self-pay | Admitting: Family Medicine

## 2020-01-11 ENCOUNTER — Encounter: Payer: Self-pay | Admitting: Certified Nurse Midwife

## 2020-02-15 ENCOUNTER — Other Ambulatory Visit: Payer: Self-pay | Admitting: Obstetrics & Gynecology

## 2020-02-15 DIAGNOSIS — Z1231 Encounter for screening mammogram for malignant neoplasm of breast: Secondary | ICD-10-CM

## 2020-02-16 ENCOUNTER — Other Ambulatory Visit: Payer: Self-pay

## 2020-02-16 ENCOUNTER — Ambulatory Visit
Admission: RE | Admit: 2020-02-16 | Discharge: 2020-02-16 | Disposition: A | Discharge status: Home / Self Care | Payer: 59 | Source: Ambulatory Visit | Attending: Obstetrics & Gynecology | Admitting: Obstetrics & Gynecology

## 2020-02-16 DIAGNOSIS — Z1231 Encounter for screening mammogram for malignant neoplasm of breast: Secondary | ICD-10-CM

## 2020-02-24 ENCOUNTER — Other Ambulatory Visit: Payer: Self-pay

## 2020-02-24 ENCOUNTER — Encounter: Payer: Self-pay | Admitting: Family Medicine

## 2020-02-24 ENCOUNTER — Ambulatory Visit: Payer: 59 | Admitting: Family Medicine

## 2020-02-24 VITALS — BP 130/86 | Temp 98.2°F | Wt 156.2 lb

## 2020-02-24 DIAGNOSIS — F411 Generalized anxiety disorder: Secondary | ICD-10-CM

## 2020-02-24 DIAGNOSIS — G43009 Migraine without aura, not intractable, without status migrainosus: Secondary | ICD-10-CM

## 2020-02-24 MED ORDER — CLONAZEPAM 0.5 MG PO TABS: ORAL_TABLET | ORAL | 5 refills | Status: DC

## 2020-02-24 MED ORDER — SUMATRIPTAN SUCCINATE 100 MG PO TABS: ORAL_TABLET | ORAL | 99 refills | Status: DC

## 2020-02-24 NOTE — Progress Notes (Signed)
Subjective:    Patient ID: Samantha Wang, female    DOB: Sep 10, 1971, 49 y.o.   MRN: [id number]  HPI  Patient arrives for a follow up on migraine headaches and insomnia. Patient states she is doing well with no concerns.  Overall manageable,  Tied into menstrual cycles   Las few months no meds  Pt    melatonin helped a little but not a lot and might have caused headache  Has been on clonazepam, utilizes primarily for substantial anxiety during the day.  Getting back into face-to-face work.  Worried that anxiety will be more of a substantial issue going forward  Migraine headaches overall are in good control and uncommon.  Imitrex definitely helps so when they come  Needing more at one point  exrecising some, walking and doing  Review of Systems No current headache no chest pain no shortness of breath    Objective:   Physical Exam  Alert vitals stable, NAD. Blood pressure good on repeat. HEENT normal. Lungs clear. Heart regular rate and rhythm.       Assessment & Plan:  Impression 1 migraine headaches overall good control discussed to maintain same meds  2.  Generalized anxiety discussed clonazepam refilled proper use discussed precautions discussed

## 2020-08-02 ENCOUNTER — Ambulatory Visit: Payer: 59 | Admitting: Certified Nurse Midwife

## 2020-11-08 ENCOUNTER — Ambulatory Visit: Payer: Managed Care, Other (non HMO) | Admitting: Obstetrics and Gynecology

## 2020-11-08 NOTE — Progress Notes (Deleted)
50 y.o. G62P2002 Married Caucasian female here for annual exam.    PCP:     No LMP recorded.           Sexually active: {yes no:314532}  The current method of family planning is vasectomy.    Exercising: {yes no:314532}  {types:19826} Smoker:  no  Health Maintenance: Pap: 03-13-18 Neg, 03-08-16 Neg:Neg HR HPV, 01-11-14 Neg History of abnormal Pap:  no MMG: 02-16-20 3D/Neg/density C/Birads1 Colonoscopy:  ***07-19-17 incomplete;08-20-17 Virtual colonoscopy:Normal BMD: 03-11-15  Result :Normal TDaP: ***2-30-11 Gardasil:   no HIV:03-08-16 NR Hep C:03-08-16 Neg Screening Labs:  Hb today: ***, Urine today: ***   reports that she has never smoked. She has never used smokeless tobacco. She reports current alcohol use. She reports that she does not use drugs.  Past Medical History:  Diagnosis Date  . Allergy   . Anemia   . Anxiety   . Breast nodule    left   . GERD (gastroesophageal reflux disease)   . H/O cesarean section    history of 2 c-sections    . History of hiatal hernia   . Peptic ulcer disease   . PONV (postoperative nausea and vomiting)   . Reflux     Past Surgical History:  Procedure Laterality Date  . BIOPSY  07/19/2017   Procedure: BIOPSY;  Surgeon: Malissa Hippo, MD;  Location: [facility];  Service: Endoscopy;;  duodenum  . CESAREAN SECTION  [id number]  . CESAREAN SECTION     x2  . COLONOSCOPY N/A 07/19/2017   Procedure: COLONOSCOPY;  Surgeon: Malissa Hippo, MD;  [facility];  Service: Endoscopy;  Laterality: N/A;  . ESOPHAGOGASTRODUODENOSCOPY N/A 10/11/2015   Procedure: ESOPHAGOGASTRODUODENOSCOPY (EGD);  Surgeon: Malissa Hippo, MD;  [facility];  Service: Endoscopy;  Laterality: N/A;  1230  . ESOPHAGOGASTRODUODENOSCOPY N/A 06/06/2016   Procedure: ESOPHAGOGASTRODUODENOSCOPY (EGD);  Surgeon: Malissa Hippo, MD;  [facility];  Service: Endoscopy;  Laterality: N/A;  255  . ESOPHAGOGASTRODUODENOSCOPY N/A 07/19/2017    Procedure: ESOPHAGOGASTRODUODENOSCOPY (EGD);  Surgeon: Malissa Hippo, MD;  [facility];  Service: Endoscopy;  Laterality: N/A;  8:25  . ulcer surgery  08/2016   abdominal  . UPPER GASTROINTESTINAL ENDOSCOPY      Current Outpatient Medications  Medication Sig Dispense Refill  . baclofen (LIORESAL) 10 MG tablet TAKE 1 TABLET BY MOUTH TWICE DAILY AS NEEDED FOR HEADACHE. AVOID DAILY USE. LIMIT USE TO 1 2 DAYS PER WEEK.  0  . cetirizine (ZYRTEC) 10 MG tablet Take 10 mg by mouth daily.    . clonazePAM (KLONOPIN) 0.5 MG tablet 1/2 to 1 po BID prn anxiety 30 tablet 5  . fluticasone (FLONASE) 50 MCG/ACT nasal spray USE TWO SPRAY(S) IN EACH NOSTRIL ONCE DAILY AS NEEDED FOR CONGESTION --  **NEEDS  OFFICE  VISIT** 16 g 5  . SUMAtriptan (IMITREX) 100 MG tablet TAKE 1 TABLET BY MOUTH AS NEEDED FOR MIGRAINE. MAY REPEAT ONCE AFTER 2 HOURS 10 tablet prn   No current facility-administered medications for this visit.    Family History  Problem Relation Age of Onset  . Healthy Mother   . Hyperlipidemia Father   . Healthy Brother   . Healthy Son   . Healthy Daughter   . Breast cancer Maternal Grandmother     Review of Systems  Exam:   There were no vitals taken for this visit.    General appearance: alert, cooperative and appears stated age Head:  normocephalic, without obvious abnormality, atraumatic Neck: no adenopathy, supple, symmetrical, trachea midline and thyroid normal to inspection and palpation Lungs: clear to auscultation bilaterally Breasts: normal appearance, no masses or tenderness, No nipple retraction or dimpling, No nipple discharge or bleeding, No axillary adenopathy Heart: regular rate and rhythm Abdomen: soft, non-tender; no masses, no organomegaly Extremities: extremities normal, atraumatic, no cyanosis or edema Skin: skin color, texture, turgor normal. No rashes or lesions Lymph nodes: cervical, supraclavicular, and axillary nodes normal. Neurologic: grossly  normal  Pelvic: External genitalia:  no lesions              No abnormal inguinal nodes palpated.              Urethra:  normal appearing urethra with no masses, tenderness or lesions              Bartholins and Skenes: normal                 Vagina: normal appearing vagina with normal color and discharge, no lesions              Cervix: no lesions              Pap taken: {yes no:314532} Bimanual Exam:  Uterus:  normal size, contour, position, consistency, mobility, non-tender              Adnexa: no mass, fullness, tenderness              Rectal exam: {yes no:314532}.  Confirms.              Anus:  normal sphincter tone, no lesions  Chaperone was present for exam.  Assessment:   Well woman visit with normal exam.   Plan: Mammogram screening discussed. Self breast awareness reviewed. Pap and HR HPV as above. Guidelines for Calcium, Vitamin D, regular exercise program including cardiovascular and weight bearing exercise.   Follow up annually and prn.   Additional counseling given.  {yes [id number]. _______ minutes face to face time of which over 50% was spent in counseling.    After visit summary provided.

## 2020-11-25 ENCOUNTER — Ambulatory Visit (INDEPENDENT_AMBULATORY_CARE_PROVIDER_SITE_OTHER): Payer: Managed Care, Other (non HMO) | Admitting: Obstetrics and Gynecology

## 2020-11-25 ENCOUNTER — Encounter: Payer: Self-pay | Admitting: Obstetrics and Gynecology

## 2020-11-25 ENCOUNTER — Other Ambulatory Visit: Payer: Self-pay

## 2020-11-25 VITALS — BP 138/80 | HR 76 | Resp 14 | Ht 63.0 in | Wt 142.0 lb

## 2020-11-25 DIAGNOSIS — Z01419 Encounter for gynecological examination (general) (routine) without abnormal findings: Secondary | ICD-10-CM | POA: Diagnosis not present

## 2020-11-25 NOTE — Progress Notes (Signed)
50 y.o. G68P2002 Married Caucasian female here for annual exam.    Patient has hot flashes off & on, mostly at night, manageable.   Has gone for months without menses. Usually having them monthly.   Not vaccinated against Covid.   PCP: Dr.Taylor    Patient's last menstrual period was 08/31/2020 (exact date).     Period Pattern: (!) Irregular     Sexually active: Yes.    The current method of family planning is vasectomy.    Exercising: Yes.    walks most nights Smoker:  no  Health Maintenance: Pap: 03-13-18 neg, 03-08-16 Neg:Neg HR HPV, 01-11-14 Neg History of abnormal Pap:  no MMG:  02-16-20 Neg/Birads1 Colonoscopy: 08-20-17 virtual colonoscopy--toruous colon. 07-19-17 incomplete colonoscopy. BMD: 03-11-15  Result :Normal TDaP:  2016 Gardasil:   no HIV: Neg in preg Hep C: 03-08-16 Neg Screening Labs:  PCP.    reports that she has never smoked. She has never used smokeless tobacco. She reports previous alcohol use. She reports that she does not use drugs.  Past Medical History:  Diagnosis Date  . Allergy   . Anemia   . Anxiety   . Breast nodule    left   . GERD (gastroesophageal reflux disease)   . H/O cesarean section    history of 2 c-sections    . History of hiatal hernia   . Peptic ulcer disease   . PONV (postoperative nausea and vomiting)   . Reflux     Past Surgical History:  Procedure Laterality Date  . BIOPSY  07/19/2017   Procedure: BIOPSY;  Surgeon: Malissa Hippo, MD;  [facility]: [facility];  Service: Endoscopy;;  duodenum  . CESAREAN SECTION  [id number]  . CESAREAN SECTION     x2  . COLONOSCOPY N/A 07/19/2017   Procedure: COLONOSCOPY;  Surgeon: Malissa Hippo, MD;  [facility];  Service: Endoscopy;  Laterality: N/A;  . ESOPHAGOGASTRODUODENOSCOPY N/A 10/11/2015   Procedure: ESOPHAGOGASTRODUODENOSCOPY (EGD);  Surgeon: Malissa Hippo, MD;  [facility];  Service: Endoscopy;  Laterality: N/A;  1230  . ESOPHAGOGASTRODUODENOSCOPY  N/A 06/06/2016   Procedure: ESOPHAGOGASTRODUODENOSCOPY (EGD);  Surgeon: Malissa Hippo, MD;  [facility];  Service: Endoscopy;  Laterality: N/A;  255  . ESOPHAGOGASTRODUODENOSCOPY N/A 07/19/2017   Procedure: ESOPHAGOGASTRODUODENOSCOPY (EGD);  Surgeon: Malissa Hippo, MD;  [facility];  Service: Endoscopy;  Laterality: N/A;  8:25  . PARTIAL GASTRECTOMY  2017  . ulcer surgery  08/2016   abdominal  . UPPER GASTROINTESTINAL ENDOSCOPY      Current Outpatient Medications  Medication Sig Dispense Refill  . baclofen (LIORESAL) 10 MG tablet TAKE 1 TABLET BY MOUTH TWICE DAILY AS NEEDED FOR HEADACHE. AVOID DAILY USE. LIMIT USE TO 1 2 DAYS PER WEEK.  0  . cetirizine (ZYRTEC) 10 MG tablet Take 10 mg by mouth daily.    . clonazePAM (KLONOPIN) 0.5 MG tablet 1/2 to 1 po BID prn anxiety 30 tablet 5  . fluticasone (FLONASE) 50 MCG/ACT nasal spray USE TWO SPRAY(S) IN EACH NOSTRIL ONCE DAILY AS NEEDED FOR CONGESTION --  **NEEDS  OFFICE  VISIT** 16 g 5  . SUMAtriptan (IMITREX) 100 MG tablet TAKE 1 TABLET BY MOUTH AS NEEDED FOR MIGRAINE. MAY REPEAT ONCE AFTER 2 HOURS 10 tablet prn   No current facility-administered medications for this visit.    Family History  Problem Relation Age of Onset  . Healthy Mother   . Cancer Mother  brain cancer-2018  . Hyperlipidemia Father   . Healthy Brother   . Healthy Son   . Healthy Daughter   . Breast cancer Maternal Grandmother     Review of Systems  All other systems reviewed and are negative.   Exam:   BP 138/80   Pulse 76   Resp 14   Ht 5\' 3"  (1.6 m)   Wt 142 lb (64.4 kg)   LMP 08/31/2020 (Exact Date)   BMI 25.15 kg/m     General appearance: alert, cooperative and appears stated age Head: normocephalic, without obvious abnormality, atraumatic Neck: no adenopathy, supple, symmetrical, trachea midline and thyroid normal to inspection and palpation Lungs: clear to auscultation bilaterally Breasts: normal appearance, no  masses or tenderness, No nipple retraction or dimpling, No nipple discharge or bleeding, No axillary adenopathy Heart: regular rate and rhythm Abdomen: soft, non-tender; no masses, no organomegaly Extremities: extremities normal, atraumatic, no cyanosis or edema Skin: skin color, texture, turgor normal. No rashes or lesions Lymph nodes: cervical, supraclavicular, and axillary nodes normal. Neurologic: grossly normal  Pelvic: External genitalia:  no lesions              No abnormal inguinal nodes palpated.              Urethra:  normal appearing urethra with no masses, tenderness or lesions              Bartholins and Skenes: normal                 Vagina: normal appearing vagina with normal color and discharge, no lesions              Cervix: no lesions              Pap taken: No. Bimanual Exam:  Uterus:  normal size, contour, position, consistency, mobility, non-tender              Adnexa: no mass, fullness, tenderness              Rectal exam: Yes.  .  Confirms.              Anus:  normal sphincter tone, no lesions  Chaperone was present for exam.  Assessment:   Well woman visit with normal exam. Perimenopausal female.  Plan: Mammogram screening discussed. Self breast awareness reviewed. Pap and HR HPV as above. Guidelines for Calcium, Vitamin D, regular exercise program including cardiovascular and weight bearing exercise.   Follow up annually and prn.

## 2020-11-25 NOTE — Patient Instructions (Signed)

## 2021-01-27 ENCOUNTER — Ambulatory Visit: Payer: Managed Care, Other (non HMO) | Admitting: Family Medicine

## 2021-01-27 ENCOUNTER — Other Ambulatory Visit: Payer: Self-pay | Admitting: Obstetrics & Gynecology

## 2021-01-27 ENCOUNTER — Encounter: Payer: Self-pay | Admitting: Family Medicine

## 2021-01-27 ENCOUNTER — Other Ambulatory Visit: Payer: Self-pay

## 2021-01-27 VITALS — BP 132/80 | HR 56 | Temp 97.8°F | Ht 63.0 in | Wt 142.0 lb

## 2021-01-27 DIAGNOSIS — Z1231 Encounter for screening mammogram for malignant neoplasm of breast: Secondary | ICD-10-CM

## 2021-01-27 DIAGNOSIS — G43809 Other migraine, not intractable, without status migrainosus: Secondary | ICD-10-CM | POA: Diagnosis not present

## 2021-01-27 DIAGNOSIS — E611 Iron deficiency: Secondary | ICD-10-CM

## 2021-01-27 DIAGNOSIS — Z79899 Other long term (current) drug therapy: Secondary | ICD-10-CM

## 2021-01-27 DIAGNOSIS — F411 Generalized anxiety disorder: Secondary | ICD-10-CM

## 2021-01-27 DIAGNOSIS — D649 Anemia, unspecified: Secondary | ICD-10-CM

## 2021-01-27 DIAGNOSIS — N921 Excessive and frequent menstruation with irregular cycle: Secondary | ICD-10-CM

## 2021-01-27 MED ORDER — SUMATRIPTAN SUCCINATE 100 MG PO TABS: ORAL_TABLET | ORAL | 1 refills | Status: DC

## 2021-01-27 MED ORDER — CLONAZEPAM 0.5 MG PO TABS: ORAL_TABLET | ORAL | 2 refills | Status: DC

## 2021-01-27 NOTE — Progress Notes (Signed)
Patient ID: Samantha Wang, female    DOB: 17-Apr-1971, 50 y.o.   MRN: [id number]   Chief Complaint  Patient presents with  . Anxiety    Medication refill  . Headache    Menstrual related    Subjective:    HPI  Pt seen to establish care and f/u on migraines/anxiety. Pt has h/o menstrual migraines.  Not getting them frequently. Once every couple months. No numbness or vision changes.  Pt taking Klonapin 1/2 -1 tab p.o. bid prn. Pt has h/o anxiety Has h/o stomach issues in past from a surgery.  Also helping when having diarrhea or cramping. Usually in am before work taking klonapin to help with her abd cramping and diarrhea. Happening about  3-4x per week.  Some days trying not to take it.  Seeing gyn and didn't have labs on that visit.  Has h/o anemia and Hb 9.8. Spotting in feb, not having cycles monthly.    Medical History Roschelle has a past medical history of Allergy, Anemia, Anxiety, Breast nodule, GERD (gastroesophageal reflux disease), H/O cesarean section, History of hiatal hernia, Peptic ulcer disease, PONV (postoperative nausea and vomiting), and Reflux.   Outpatient Encounter Medications as of 01/27/2021  Medication Sig  . cetirizine (ZYRTEC) 10 MG tablet Take 10 mg by mouth daily.  . fluticasone (FLONASE) 50 MCG/ACT nasal spray USE TWO SPRAY(S) IN EACH NOSTRIL ONCE DAILY AS NEEDED FOR CONGESTION --  **NEEDS  OFFICE  VISIT**  . [DISCONTINUED] clonazePAM (KLONOPIN) 0.5 MG tablet 1/2 to 1 po BID prn anxiety  . [DISCONTINUED] SUMAtriptan (IMITREX) 100 MG tablet TAKE 1 TABLET BY MOUTH AS NEEDED FOR MIGRAINE. MAY REPEAT ONCE AFTER 2 HOURS  . baclofen (LIORESAL) 10 MG tablet TAKE 1 TABLET BY MOUTH TWICE DAILY AS NEEDED FOR HEADACHE. AVOID DAILY USE. LIMIT USE TO 1 2 DAYS PER WEEK. (Patient not taking: Reported on 01/27/2021)  . clonazePAM (KLONOPIN) 0.5 MG tablet 1/2 to 1 po BID prn anxiety  . SUMAtriptan (IMITREX) 100 MG tablet TAKE 1 TABLET BY MOUTH AS NEEDED FOR  MIGRAINE. MAY REPEAT ONCE AFTER 2 HOURS   No facility-administered encounter medications on file as of 01/27/2021.     Review of Systems  Constitutional: Negative for chills and fever.  HENT: Negative for congestion, rhinorrhea and sore throat.   Respiratory: Negative for cough, shortness of breath and wheezing.   Cardiovascular: Negative for chest pain and leg swelling.  Gastrointestinal: Positive for abdominal pain and diarrhea. Negative for nausea and vomiting.  Genitourinary: Positive for menstrual problem (irregular cycles). Negative for dysuria and frequency.  Musculoskeletal: Negative for arthralgias and back pain.  Skin: Negative for rash.  Neurological: Negative for dizziness, weakness and headaches.  Psychiatric/Behavioral: Negative for dysphoric mood, sleep disturbance and suicidal ideas. The patient is nervous/anxious.      Vitals BP 132/80   Pulse (!) 56   Temp 97.8 F (36.6 C)   Ht 5\' 3"  (1.6 m)   Wt 142 lb (64.4 kg)   LMP 12/08/2020   SpO2 99%   BMI 25.15 kg/m   Objective:   Physical Exam Vitals and nursing note reviewed.  Constitutional:      General: She is not in acute distress.    Appearance: Normal appearance. She is not ill-appearing.  HENT:     Head: Normocephalic and atraumatic.     Nose: Nose normal.     Mouth/Throat:     Mouth: Mucous membranes are moist.     Pharynx: Oropharynx is  clear.  Eyes:     Extraocular Movements: Extraocular movements intact.     Conjunctiva/sclera: Conjunctivae normal.     Pupils: Pupils are equal, round, and reactive to light.  Cardiovascular:     Rate and Rhythm: Regular rhythm. Bradycardia present.     Pulses: Normal pulses.     Heart sounds: Normal heart sounds.  Pulmonary:     Effort: Pulmonary effort is normal.     Breath sounds: Normal breath sounds. No wheezing, rhonchi or rales.  Musculoskeletal:        General: Normal range of motion.     Right lower leg: No edema.     Left lower leg: No edema.   Skin:    General: Skin is warm and dry.     Findings: No lesion or rash.  Neurological:     General: No focal deficit present.     Mental Status: She is alert and oriented to person, place, and time.  Psychiatric:        Mood and Affect: Mood normal. Mood is not anxious or depressed.        Behavior: Behavior normal.      Assessment and Plan   1. Other migraine without status migrainosus, not intractable  2. Anemia, unspecified type - CBC  3. Iron deficiency - CBC - Ferritin - Iron Binding Cap (TIBC)([facility]) - Iron  4. Generalized anxiety disorder  5. High risk medication use - ToxASSURE Select 13 (MW), Urine  6. Metrorrhagia   Pt to cont with f/y with gyn for metrorrhagia and anemia.  GAD/abdominal cramping/diarrhea- use clonazepam prn, if continuing or worsening recommending f/u gi.  Iron def anemia- recheck labs.  Migraines- stable.  Cont imitrex.   Return in about 6 months (around 07/29/2021) for f/u anxiety/migraines.

## 2021-01-28 LAB — IRON AND TIBC
Iron Saturation: 6 % — CL (ref 15–55)
Iron: 23 ug/dL — ABNORMAL LOW (ref 27–159)
Total Iron Binding Capacity: 409 ug/dL (ref 250–450)
UIBC: 386 ug/dL (ref 131–425)

## 2021-01-28 LAB — CBC
Hematocrit: 33.8 % — ABNORMAL LOW (ref 34.0–46.6)
Hemoglobin: 10.7 g/dL — ABNORMAL LOW (ref 11.1–15.9)
MCH: 24.5 pg — ABNORMAL LOW (ref 26.6–33.0)
MCHC: 31.7 g/dL (ref 31.5–35.7)
MCV: 78 fL — ABNORMAL LOW (ref 79–97)
Platelets: 327 10*3/uL (ref 150–450)
RBC: 4.36 x10E6/uL (ref 3.77–5.28)
RDW: 15.3 % (ref 11.7–15.4)
WBC: 5.3 10*3/uL (ref 3.4–10.8)

## 2021-01-28 LAB — FERRITIN: Ferritin: 5 ng/mL — ABNORMAL LOW (ref 15–150)

## 2021-02-01 ENCOUNTER — Other Ambulatory Visit: Payer: Self-pay | Admitting: *Deleted

## 2021-02-01 ENCOUNTER — Encounter: Payer: Self-pay | Admitting: Family Medicine

## 2021-02-01 DIAGNOSIS — R790 Abnormal level of blood mineral: Secondary | ICD-10-CM

## 2021-02-01 DIAGNOSIS — D509 Iron deficiency anemia, unspecified: Secondary | ICD-10-CM | POA: Insufficient documentation

## 2021-02-01 DIAGNOSIS — D649 Anemia, unspecified: Secondary | ICD-10-CM

## 2021-02-01 LAB — TOXASSURE SELECT 13 (MW), URINE

## 2021-02-19 ENCOUNTER — Encounter: Payer: Self-pay | Admitting: Family Medicine

## 2021-03-22 ENCOUNTER — Ambulatory Visit
Admission: RE | Admit: 2021-03-22 | Discharge: 2021-03-22 | Disposition: A | Discharge status: Home / Self Care | Payer: 59 | Source: Ambulatory Visit | Attending: Obstetrics & Gynecology | Admitting: Obstetrics & Gynecology

## 2021-03-22 ENCOUNTER — Other Ambulatory Visit: Payer: Self-pay

## 2021-03-22 DIAGNOSIS — Z1231 Encounter for screening mammogram for malignant neoplasm of breast: Secondary | ICD-10-CM

## 2021-08-15 ENCOUNTER — Other Ambulatory Visit: Payer: Self-pay

## 2021-08-15 ENCOUNTER — Ambulatory Visit: Payer: Managed Care, Other (non HMO) | Admitting: Family Medicine

## 2021-08-15 VITALS — BP 138/86 | HR 65 | Ht 63.0 in | Wt 142.6 lb

## 2021-08-15 DIAGNOSIS — F419 Anxiety disorder, unspecified: Secondary | ICD-10-CM

## 2021-08-15 DIAGNOSIS — K279 Peptic ulcer, site unspecified, unspecified as acute or chronic, without hemorrhage or perforation: Secondary | ICD-10-CM

## 2021-08-15 DIAGNOSIS — R7303 Prediabetes: Secondary | ICD-10-CM

## 2021-08-15 DIAGNOSIS — G43709 Chronic migraine without aura, not intractable, without status migrainosus: Secondary | ICD-10-CM | POA: Diagnosis not present

## 2021-08-15 DIAGNOSIS — G43829 Menstrual migraine, not intractable, without status migrainosus: Secondary | ICD-10-CM

## 2021-08-15 DIAGNOSIS — D508 Other iron deficiency anemias: Secondary | ICD-10-CM

## 2021-08-15 MED ORDER — CLONAZEPAM 0.5 MG PO TABS: ORAL_TABLET | ORAL | 2 refills | Status: DC

## 2021-08-15 MED ORDER — SUMATRIPTAN SUCCINATE 100 MG PO TABS: ORAL_TABLET | ORAL | 1 refills | Status: DC

## 2021-08-15 NOTE — Patient Instructions (Signed)
Medications refilled.  Labs today.  I am placing referral regarding your anemia/iron.  Take care  Dr. Adriana Simas

## 2021-08-15 NOTE — Assessment & Plan Note (Signed)
Not taking iron supplementation but is feeling well. Labs today. Will refer to Hematology for IV Iron.

## 2021-08-15 NOTE — Progress Notes (Signed)
Subjective:  Patient ID: Samantha Wang, female    DOB: [birth year]  Age: 50 y.o. MRN: [id number]  CC: Chief Complaint  Patient presents with   Migraine    Follow up Anxiety follow up No problems or concerns per patient Needs refills on medications    HPI:  50 year old female presents for follow up and to establish care with me.   Anxiety She states that she is doing well. Uses PRN Klonopin. Helps GI symptoms as well.  Iron deficiency anemia Patient states that she is not taking Iron as it causes constipation. Last Hb was 10.7; very low iron stores. Irregular periods (perimenopause).  Denies rectal bleeding.  Has never been given IV Iron.   Menstrual Migraine Responds well to Imitrex. Needs refill. Becoming less of an issue due to more infrequent periods.  Patient Active Problem List   Diagnosis Date Noted   Prediabetes 08/15/2021   Menstrual migraine 08/15/2021   Iron deficiency anemia 02/01/2021   Depression 04/18/2015   Insomnia 06/23/2014   Anxiety 06/10/2013   GERD (gastroesophageal reflux disease) 10/06/2012   PUD (peptic ulcer disease) 10/06/2012   IBS (irritable bowel syndrome) 10/06/2012    Social Hx   Social History   Socioeconomic History   Marital status: Married    Spouse name: Not on file   Number of children: Not on file   Years of education: Not on file   Highest education level: Not on file  Occupational History   Not on file  Tobacco Use   Smoking status: Never   Smokeless tobacco: Never  Vaping Use   Vaping Use: Never used  Substance and Sexual Activity   Alcohol use: Not Currently   Drug use: No   Sexual activity: Yes    Partners: Male    Birth control/protection: Other-see comments    Comment: spouse-vasectomy   Other Topics Concern   Not on file  Social History Narrative   Not on file   Social Determinants of Health   Financial Resource Strain: Not on file  Food Insecurity: Not on file  Transportation Needs: Not on  file  Physical Activity: Not on file  Stress: Not on file  Social Connections: Not on file    Review of Systems  Constitutional: Negative.   Gastrointestinal:  Positive for abdominal pain. Negative for blood in stool.   Objective:  BP 138/86   Pulse 65   Ht 5\' 3"  (1.6 m)   Wt 142 lb 9.6 oz (64.7 kg)   BMI 25.26 kg/m   BP/Weight 08/15/2021 01/27/2021 11/25/2020  Systolic BP 138 132 138  Diastolic BP 86 80 80  Wt. (Lbs) 142.6 142 142  BMI 25.26 25.15 25.15    Physical Exam Constitutional:      General: She is not in acute distress.    Appearance: Normal appearance. She is not ill-appearing.  HENT:     Head: Normocephalic and atraumatic.  Eyes:     General:        Right eye: No discharge.        Left eye: No discharge.     Conjunctiva/sclera: Conjunctivae normal.  Cardiovascular:     Rate and Rhythm: Normal rate and regular rhythm.  Pulmonary:     Effort: Pulmonary effort is normal.     Breath sounds: Normal breath sounds. No wheezing or rales.  Abdominal:     General: There is no distension.     Palpations: Abdomen is soft.  Tenderness: There is no abdominal tenderness.  Neurological:     Mental Status: She is alert.  Psychiatric:        Mood and Affect: Mood normal.        Behavior: Behavior normal.      Lab Results  Component Value Date   WBC 5.3 01/27/2021   HGB 10.7 (L) 01/27/2021   HCT 33.8 (L) 01/27/2021   PLT 327 01/27/2021   GLUCOSE 91 07/29/2019   CHOL 165 07/29/2019   TRIG 48 07/29/2019   HDL 68 07/29/2019   LDLCALC 87 07/29/2019   ALT 10 07/29/2019   AST 16 07/29/2019   NA 139 07/29/2019   K 4.1 07/29/2019   CL 102 07/29/2019   CREATININE 0.63 07/29/2019   BUN 12 07/29/2019   CO2 24 07/29/2019   TSH 1.450 07/29/2019   HGBA1C 6.0 (H) 07/29/2019     Assessment & Plan:   Problem List Items Addressed This Visit       Cardiovascular and Mediastinum   RESOLVED: Migraine   Relevant Medications   clonazePAM (KLONOPIN) 0.5 MG tablet    SUMAtriptan (IMITREX) 100 MG tablet   Menstrual migraine    Stable. Infrequent due to irregular menses. Uses PRN Imitrex. Refilled today.      Relevant Medications   clonazePAM (KLONOPIN) 0.5 MG tablet   SUMAtriptan (IMITREX) 100 MG tablet     Digestive   PUD (peptic ulcer disease) (Chronic)   Relevant Orders   CBC   Vitamin B12   Folate   CMP14+EGFR     Other   Iron deficiency anemia (Chronic)    Not taking iron supplementation but is feeling well. Labs today. Will refer to Hematology for IV Iron.      Relevant Orders   Iron, TIBC and Ferritin Panel   Ambulatory referral to Hematology / Oncology   Prediabetes   Relevant Orders   Hemoglobin A1c   Anxiety - Primary    Stable. PRN Klonopin. Refilled today.        Meds ordered this encounter  Medications   clonazePAM (KLONOPIN) 0.5 MG tablet    Sig: 1/2 to 1 po BID prn anxiety    Dispense:  30 tablet    Refill:  2   SUMAtriptan (IMITREX) 100 MG tablet    Sig: TAKE 1 TABLET BY MOUTH AS NEEDED FOR MIGRAINE. MAY REPEAT ONCE AFTER 2 HOURS    Dispense:  10 tablet    Refill:  1    Follow-up:  Will be made following lab results/referral.  Everlene Other DO [facility]

## 2021-08-15 NOTE — Assessment & Plan Note (Signed)
Stable. PRN Klonopin. Refilled today.

## 2021-08-15 NOTE — Assessment & Plan Note (Signed)
Stable. Infrequent due to irregular menses. Uses PRN Imitrex. Refilled today.

## 2021-08-16 LAB — CMP14+EGFR
ALT: 8 IU/L (ref 0–32)
AST: 15 IU/L (ref 0–40)
Albumin/Globulin Ratio: 1.8 (ref 1.2–2.2)
Albumin: 4.6 g/dL (ref 3.8–4.8)
Alkaline Phosphatase: 55 IU/L (ref 44–121)
BUN/Creatinine Ratio: 13 (ref 9–23)
BUN: 9 mg/dL (ref 6–24)
Bilirubin Total: 0.3 mg/dL (ref 0.0–1.2)
CO2: 23 mmol/L (ref 20–29)
Calcium: 9.1 mg/dL (ref 8.7–10.2)
Chloride: 104 mmol/L (ref 96–106)
Creatinine, Ser: 0.7 mg/dL (ref 0.57–1.00)
Globulin, Total: 2.5 g/dL (ref 1.5–4.5)
Glucose: 91 mg/dL (ref 70–99)
Potassium: 4.2 mmol/L (ref 3.5–5.2)
Sodium: 141 mmol/L (ref 134–144)
Total Protein: 7.1 g/dL (ref 6.0–8.5)
eGFR: 105 mL/min/{1.73_m2} (ref >59)

## 2021-08-16 LAB — HEMOGLOBIN A1C
Est. average glucose Bld gHb Est-mCnc: 123 mg/dL
Hgb A1c MFr Bld: 5.9 % — ABNORMAL HIGH (ref 4.8–5.6)

## 2021-08-16 LAB — IRON,TIBC AND FERRITIN PANEL
Ferritin: 6 ng/mL — ABNORMAL LOW (ref 15–150)
Iron Saturation: 9 % — CL (ref 15–55)
Iron: 32 ug/dL (ref 27–159)
Total Iron Binding Capacity: 357 ug/dL (ref 250–450)
UIBC: 325 ug/dL (ref 131–425)

## 2021-08-16 LAB — CBC
Hematocrit: 37.2 % (ref 34.0–46.6)
Hemoglobin: 11.9 g/dL (ref 11.1–15.9)
MCH: 27.4 pg (ref 26.6–33.0)
MCHC: 32 g/dL (ref 31.5–35.7)
MCV: 86 fL (ref 79–97)
Platelets: 273 10*3/uL (ref 150–450)
RBC: 4.34 x10E6/uL (ref 3.77–5.28)
RDW: 14.5 % (ref 11.7–15.4)
WBC: 4.8 10*3/uL (ref 3.4–10.8)

## 2021-08-16 LAB — VITAMIN B12: Vitamin B-12: 271 pg/mL (ref 232–1245)

## 2021-08-16 LAB — FOLATE: Folate: 5.6 ng/mL (ref >3.0)

## 2021-08-30 NOTE — Progress Notes (Signed)
[facility] [address] [place] [address]   CLINIC:  Medical Oncology/Hematology  Patient Care Team: Coral Spikes, DO as PCP - General (Family Medicine) Derek Jack, MD as Medical Oncologist (Hematology)  CHIEF COMPLAINTS/PURPOSE OF CONSULTATION:  Evaluation of IDA  HISTORY OF PRESENTING ILLNESS:  Samantha Wang 50 y.o. female is here because of evaluation of IDA, at the request of [facility].  Today she reports feeling good. She tried to take iron tablets, but she could not tolerate them as she has a history of stomach ulcers, one of which was treated with surgery in 2017. She reports sporadic light menses that last 2 weeks. She denies ice pica, hematuria, hematochezia, and blacks stools. She walks 30 minutes a day, 5 days a week for exercise. She denies tingling/numbness in her hands and feet. 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 She denies a personal or family history of blood transfusions. Her paternal grandmother had breast and lung cancer, and her mother had brain cancer.   MEDICAL HISTORY:  Past Medical History:  Diagnosis Date   Allergy    Anemia    Anxiety    Breast nodule    left    GERD (gastroesophageal reflux disease)    H/O cesarean section    history of 2 c-sections     History of hiatal hernia    Peptic ulcer disease    PONV (postoperative nausea and vomiting)    Reflux     SURGICAL HISTORY: Past Surgical History:  Procedure Laterality Date   BIOPSY  07/19/2017   Procedure: BIOPSY;  Surgeon: Rogene Houston, MD;  [facility];  Service: Endoscopy;;  duodenum   CESAREAN SECTION  2482,5003   CESAREAN SECTION     x2   COLONOSCOPY N/A 07/19/2017   Procedure: COLONOSCOPY;  Surgeon: Rogene Houston, MD;  [facility]: [facility];  Service: Endoscopy;  Laterality: N/A;   ESOPHAGOGASTRODUODENOSCOPY N/A 10/11/2015   Procedure: ESOPHAGOGASTRODUODENOSCOPY (EGD);  Surgeon: Rogene Houston, MD;   [facility];  Service: Endoscopy;  Laterality: N/A;  1230   ESOPHAGOGASTRODUODENOSCOPY N/A 06/06/2016   Procedure: ESOPHAGOGASTRODUODENOSCOPY (EGD);  Surgeon: Rogene Houston, MD;  [facility];  Service: Endoscopy;  Laterality: N/A;  255   ESOPHAGOGASTRODUODENOSCOPY N/A 07/19/2017   Procedure: ESOPHAGOGASTRODUODENOSCOPY (EGD);  Surgeon: Rogene Houston, MD;  [facility];  Service: Endoscopy;  Laterality: N/A;  8:25   PARTIAL GASTRECTOMY  2017   ulcer surgery  08/2016   abdominal   UPPER GASTROINTESTINAL ENDOSCOPY      SOCIAL HISTORY: Social History   Socioeconomic History   Marital status: Married    Spouse name: Not on file   Number of children: Not on file   Years of education: Not on file   Highest education level: Not on file  Occupational History   Not on file  Tobacco Use   Smoking status: Never   Smokeless tobacco: Never  Vaping Use   Vaping Use: Never used  Substance and Sexual Activity   Alcohol use: Not Currently   Drug use: No   Sexual activity: Yes    Partners: Male    Birth control/protection: Other-see comments    Comment: spouse-vasectomy   Other Topics Concern   Not on file  Social History Narrative   Not on file   Social Determinants of Health   Financial Resource Strain: Not on file  Food Insecurity: Not on  file  Transportation Needs: Not on file  Physical Activity: Not on file  Stress: Not on file  Social Connections: Not on file  Intimate Partner Violence: Not on file    FAMILY HISTORY: Family History  Problem Relation Age of Onset   Healthy Mother    Cancer Mother        brain cancer-2018   Hyperlipidemia Father    Healthy Brother    Healthy Son    Healthy Daughter    Breast cancer Maternal Grandmother     ALLERGIES:  has No Known Allergies.  MEDICATIONS:  Current Outpatient Medications  Medication Sig Dispense Refill   clonazePAM (KLONOPIN) 0.5 MG tablet 1/2 to 1 po BID prn anxiety 30 tablet 2    fluticasone (FLONASE) 50 MCG/ACT nasal spray USE TWO SPRAY(S) IN EACH NOSTRIL ONCE DAILY AS NEEDED FOR CONGESTION --  **NEEDS  OFFICE  VISIT** 16 g 5   SUMAtriptan (IMITREX) 100 MG tablet TAKE 1 TABLET BY MOUTH AS NEEDED FOR MIGRAINE. MAY REPEAT ONCE AFTER 2 HOURS 10 tablet 1   No current facility-administered medications for this visit.    REVIEW OF SYSTEMS:   Review of Systems  Constitutional:  Negative for appetite change and fatigue (75%).  Gastrointestinal:  Negative for blood in stool.  Genitourinary:  Positive for menstrual problem (irregular). Negative for hematuria.   Neurological:  Negative for numbness.  All other systems reviewed and are negative.   PHYSICAL EXAMINATION: ECOG PERFORMANCE STATUS: 0 - Asymptomatic  Vitals:   09/01/21 1111  BP: (!) 144/98  Pulse: 76  Resp: 16  Temp: 98.3 F (36.8 C)  SpO2: 100%   Filed Weights   09/01/21 1111  Weight: 141 lb 4.8 oz (64.1 kg)   Physical Exam Vitals reviewed.  Constitutional:      Appearance: Normal appearance.  Cardiovascular:     Rate and Rhythm: Normal rate and regular rhythm.     Pulses: Normal pulses.     Heart sounds: Normal heart sounds.  Pulmonary:     Effort: Pulmonary effort is normal.     Breath sounds: Normal breath sounds.  Abdominal:     Palpations: Abdomen is soft. There is no hepatomegaly, splenomegaly or mass.     Tenderness: There is no abdominal tenderness.  Musculoskeletal:     Right lower leg: No edema.     Left lower leg: No edema.  Lymphadenopathy:     Cervical: No cervical adenopathy.     Right cervical: No superficial cervical adenopathy.    Left cervical: No superficial cervical adenopathy.     Upper Body:     Right upper body: No supraclavicular, axillary or pectoral adenopathy.     Left upper body: No supraclavicular, axillary or pectoral adenopathy.  Neurological:     General: No focal deficit present.     Mental Status: She is alert and oriented to person, place, and  time.  Psychiatric:        Mood and Affect: Mood normal.        Behavior: Behavior normal.     LABORATORY DATA:  I have reviewed the data as listed Recent Results (from the past 2160 hour(s))  CBC     Status: None   Collection Time: 08/15/21  2:50 PM  Result Value Ref Range   WBC 4.8 3.4 - 10.8 x10E3/uL   RBC 4.34 3.77 - 5.28 x10E6/uL   Hemoglobin 11.9 11.1 - 15.9 g/dL   Hematocrit 37.2 34.0 - 46.6 %   MCV  86 79 - 97 fL   MCH 27.4 26.6 - 33.0 pg   MCHC 32.0 31.5 - 35.7 g/dL   RDW 14.5 11.7 - 15.4 %   Platelets 273 150 - 450 x10E3/uL  Hemoglobin A1c     Status: Abnormal   Collection Time: 08/15/21  2:50 PM  Result Value Ref Range   Hgb A1c MFr Bld 5.9 (H) 4.8 - 5.6 %    Comment:          Prediabetes: 5.7 - 6.4          Diabetes: >6.4          Glycemic control for adults with diabetes: <7.0    Est. average glucose Bld gHb Est-mCnc 123 mg/dL  Iron, TIBC and Ferritin Panel     Status: Abnormal   Collection Time: 08/15/21  2:50 PM  Result Value Ref Range   Total Iron Binding Capacity 357 250 - 450 ug/dL   UIBC 325 131 - 425 ug/dL   Iron 32 27 - 159 ug/dL   Iron Saturation 9 (LL) 15 - 55 %   Ferritin 6 (L) 15 - 150 ng/mL  Vitamin B12     Status: None   Collection Time: 08/15/21  2:50 PM  Result Value Ref Range   Vitamin B-12 271 232 - 1,245 pg/mL  Folate     Status: None   Collection Time: 08/15/21  2:50 PM  Result Value Ref Range   Folate 5.6 >3.0 ng/mL    Comment: A serum folate concentration of less than 3.1 ng/mL is considered to represent clinical deficiency.   CMP14+EGFR     Status: None   Collection Time: 08/15/21  2:50 PM  Result Value Ref Range   Glucose 91 70 - 99 mg/dL   BUN 9 6 - 24 mg/dL   Creatinine, Ser 0.70 0.57 - 1.00 mg/dL   eGFR 105 >59 mL/min/1.73   BUN/Creatinine Ratio 13 9 - 23   Sodium 141 134 - 144 mmol/L   Potassium 4.2 3.5 - 5.2 mmol/L   Chloride 104 96 - 106 mmol/L   CO2 23 20 - 29 mmol/L   Calcium 9.1 8.7 - 10.2 mg/dL   Total  Protein 7.1 6.0 - 8.5 g/dL   Albumin 4.6 3.8 - 4.8 g/dL   Globulin, Total 2.5 1.5 - 4.5 g/dL   Albumin/Globulin Ratio 1.8 1.2 - 2.2   Bilirubin Total 0.3 0.0 - 1.2 mg/dL   Alkaline Phosphatase 55 44 - 121 IU/L   AST 15 0 - 40 IU/L   ALT 8 0 - 32 IU/L    RADIOGRAPHIC STUDIES: I have personally reviewed the radiological images as listed and agreed with the findings in the report. No results found.  ASSESSMENT:  Iron deficiency anemia: - History of partial gastrectomy (antrectomy/vagotomy) secondary to peptic ulcer disease on 08/23/2016 by Dr. Crisoforo Oxford at [facility]. - Denies ice pica.  Denies history of transfusion. - Menstrual cycles are sporadic, not heavy, sometimes last up to 2 weeks. - No bleeding per rectum or melena. - Colonoscopy on 07/19/2017-incomplete exam to splenic flexure.  Rectum, sigmoid colon, descending and splenic flexure were normal.  External hemorrhoids.  Anal papillae were hypertrophied.  Social/family history: - She does office work.  Walks about 30 minutes 5 days a week.  Non-smoker. - No family history of blood transfusions.  Mother had brain tumor.  Paternal grandmother had breast cancer and lung cancer.   PLAN:  Iron deficiency anemia due to decreased absorption: -  We reviewed labs from 08/15/2021.  Ferritin is low at 6 and percent saturation 9.  B12 was 271.  Hemoglobin was 11.9. - High likelihood of decreased iron absorption from distal gastrectomy. - Recommend parenteral iron therapy. - We will plan for Venofer for 3 infusions to give a total of 1 g. - RTC 4 months for follow-up with repeat CBC, ferritin and iron panel.  We will also check for other nutritional deficiencies including folic acid, methylmalonic acid and B12.   All questions were answered. The patient knows to call the clinic with any problems, questions or concerns.  Derek Jack, MD 09/01/21 11:58 AM  [place] [phone]   I, Thana Ates, am acting as a  scribe for Dr. Derek Jack.  I, Derek Jack MD, have reviewed the above documentation for accuracy and completeness, and I agree with the above.

## 2021-09-01 ENCOUNTER — Encounter (HOSPITAL_COMMUNITY): Payer: Self-pay | Admitting: Hematology

## 2021-09-01 ENCOUNTER — Other Ambulatory Visit: Payer: Self-pay

## 2021-09-01 ENCOUNTER — Inpatient Hospital Stay (HOSPITAL_COMMUNITY): Payer: Managed Care, Other (non HMO) | Attending: Hematology | Admitting: Hematology

## 2021-09-01 VITALS — BP 144/98 | HR 76 | Temp 98.3°F | Resp 16 | Ht 62.0 in | Wt 141.3 lb

## 2021-09-01 DIAGNOSIS — D509 Iron deficiency anemia, unspecified: Secondary | ICD-10-CM | POA: Insufficient documentation

## 2021-09-01 DIAGNOSIS — Z803 Family history of malignant neoplasm of breast: Secondary | ICD-10-CM | POA: Diagnosis not present

## 2021-09-01 DIAGNOSIS — Z801 Family history of malignant neoplasm of trachea, bronchus and lung: Secondary | ICD-10-CM | POA: Insufficient documentation

## 2021-09-01 DIAGNOSIS — K909 Intestinal malabsorption, unspecified: Secondary | ICD-10-CM | POA: Diagnosis not present

## 2021-09-01 NOTE — Patient Instructions (Addendum)
[facility] at [facility] Discharge Instructions  You were seen and examined today by Dr. Ellin Saba. Dr. Ellin Saba is a hematologist, meaning that he specializes in blood disorders. Dr. Ellin Saba discussed your past medical history, family history of blood disorders/cancers, and the events that led to you being here today.  You were referred to Dr. Ellin Saba due to anemia. It is most likely that because of your previous stomach surgery, your body is not absorbing the iron as it should. Dr. Ellin Saba has recommended IV iron infusions.  Dr. Ellin Saba will see you back in about 4 months.  Follow-up as scheduled.   Thank you for choosing [facility] at [facility] to provide your oncology and hematology care.  To afford each patient quality time with our provider, please arrive at least 15 minutes before your scheduled appointment time.   If you have a lab appointment with the Cancer Center please come in thru the Main Entrance and check in at the main information desk.  You need to re-schedule your appointment should you arrive 10 or more minutes late.  We strive to give you quality time with our providers, and arriving late affects you and other patients whose appointments are after yours.  Also, if you no show three or more times for appointments you may be dismissed from the clinic at the providers discretion.     Again, thank you for choosing [facility].  Our hope is that these requests will decrease the amount of time that you wait before being seen by our physicians.       _____________________________________________________________  Should you have questions after your visit to [facility], please contact our office at [phone] and follow the prompts.  Our office hours are 8:00 a.m. and 4:30 p.m. Monday - Friday.  Please note that voicemails left after 4:00 p.m. may not be returned until the following  business day.  We are closed weekends and major holidays.  You do have access to a nurse 24-7, just call the main number to the clinic [phone] and do not press any options, hold on the line and a nurse will answer the phone.    For prescription refill requests, have your pharmacy contact our office and allow 72 hours.    Due to Covid, you will need to wear a mask upon entering the hospital. If you do not have a mask, a mask will be given to you at the Main Entrance upon arrival. For doctor visits, patients may have 1 support person age 10 or older with them. For treatment visits, patients can not have anyone with them due to social distancing guidelines and our immunocompromised population.

## 2021-09-05 ENCOUNTER — Other Ambulatory Visit: Payer: Self-pay

## 2021-09-05 ENCOUNTER — Inpatient Hospital Stay (HOSPITAL_COMMUNITY): Payer: Managed Care, Other (non HMO)

## 2021-09-05 VITALS — BP 135/94 | HR 60 | Temp 96.8°F | Resp 18

## 2021-09-05 DIAGNOSIS — D508 Other iron deficiency anemias: Secondary | ICD-10-CM

## 2021-09-05 DIAGNOSIS — D509 Iron deficiency anemia, unspecified: Secondary | ICD-10-CM | POA: Diagnosis not present

## 2021-09-05 MED ORDER — SODIUM CHLORIDE 0.9 % IV SOLN: Freq: Once | INTRAVENOUS | Status: AC

## 2021-09-05 MED ORDER — SODIUM CHLORIDE 0.9 % IV SOLN
300.0000 mg | Freq: Once | INTRAVENOUS | Status: AC
  Administered 2021-09-05: 300 mg via INTRAVENOUS
  Filled 2021-09-05: qty 300

## 2021-09-05 NOTE — Patient Instructions (Signed)
[facility]  Discharge Instructions: Thank you for choosing [facility] to provide your oncology and hematology care.  If you have a lab appointment with the Cancer Center, please come in thru the Main Entrance and check in at the main information desk.  Wear comfortable clothing and clothing appropriate for easy access to any Portacath or PICC line.   We strive to give you quality time with your provider. You may need to reschedule your appointment if you arrive late (15 or more minutes).  Arriving late affects you and other patients whose appointments are after yours.  Also, if you miss three or more appointments without notifying the office, you may be dismissed from the clinic at the provider's discretion.      For prescription refill requests, have your pharmacy contact our office and allow 72 hours for refills to be completed.        To help prevent nausea and vomiting after your treatment, we encourage you to take your nausea medication as directed.  BELOW ARE SYMPTOMS THAT SHOULD BE REPORTED IMMEDIATELY: *FEVER GREATER THAN 100.4 F (38 C) OR HIGHER *CHILLS OR SWEATING *NAUSEA AND VOMITING THAT IS NOT CONTROLLED WITH YOUR NAUSEA MEDICATION *UNUSUAL SHORTNESS OF BREATH *UNUSUAL BRUISING OR BLEEDING *URINARY PROBLEMS (pain or burning when urinating, or frequent urination) *BOWEL PROBLEMS (unusual diarrhea, constipation, pain near the anus) TENDERNESS IN MOUTH AND THROAT WITH OR WITHOUT PRESENCE OF ULCERS (sore throat, sores in mouth, or a toothache) UNUSUAL RASH, SWELLING OR PAIN  UNUSUAL VAGINAL DISCHARGE OR ITCHING   Items with * indicate a potential emergency and should be followed up as soon as possible or go to the Emergency Department if any problems should occur.  Please show the CHEMOTHERAPY ALERT CARD or IMMUNOTHERAPY ALERT CARD at check-in to the Emergency Department and triage nurse.  Should you have questions after your visit or need to cancel  or reschedule your appointment, please contact [facility] [phone]  and follow the prompts.  Office hours are 8:00 a.m. to 4:30 p.m. Monday - Friday. Please note that voicemails left after 4:00 p.m. may not be returned until the following business day.  We are closed weekends and major holidays. You have access to a nurse at all times for urgent questions. Please call the main number to the clinic [phone] and follow the prompts.  For any non-urgent questions, you may also contact your provider using MyChart. We now offer e-Visits for anyone 18 and older to request care online for non-urgent symptoms. For details visit [email or web address].   Also download the MyChart app! Go to the app store, search "MyChart", open the app, select [email or web address], and log in with your MyChart username and password.  Due to Covid, a mask is required upon entering the hospital/clinic. If you do not have a mask, one will be given to you upon arrival. For doctor visits, patients may have 1 support person aged 18 or older with them. For treatment visits, patients cannot have anyone with them due to current Covid guidelines and our immunocompromised population.  

## 2021-09-05 NOTE — Progress Notes (Signed)
Iron infusion given per orders. Patient tolerated it well without problems. Vitals stable and discharged home from clinic ambulatory. Follow up as scheduled.  

## 2021-09-12 ENCOUNTER — Other Ambulatory Visit: Payer: Self-pay

## 2021-09-12 ENCOUNTER — Inpatient Hospital Stay (HOSPITAL_COMMUNITY): Payer: Managed Care, Other (non HMO)

## 2021-09-12 VITALS — BP 114/79 | HR 68 | Temp 98.1°F | Resp 18

## 2021-09-12 DIAGNOSIS — D508 Other iron deficiency anemias: Secondary | ICD-10-CM

## 2021-09-12 DIAGNOSIS — D509 Iron deficiency anemia, unspecified: Secondary | ICD-10-CM | POA: Diagnosis not present

## 2021-09-12 MED ORDER — SODIUM CHLORIDE 0.9 % IV SOLN
300.0000 mg | Freq: Once | INTRAVENOUS | Status: AC
  Administered 2021-09-12: 300 mg via INTRAVENOUS
  Filled 2021-09-12: qty 300

## 2021-09-12 MED ORDER — SODIUM CHLORIDE 0.9 % IV SOLN
Freq: Once | INTRAVENOUS | Status: AC
Start: 2021-09-12 — End: 2021-09-12

## 2021-09-12 NOTE — Patient Instructions (Signed)
[facility]  Discharge Instructions: Thank you for choosing [facility] to provide your oncology and hematology care.  If you have a lab appointment with the Cancer Center, please come in thru the Main Entrance and check in at the main information desk.  Wear comfortable clothing and clothing appropriate for easy access to any Portacath or PICC line.   We strive to give you quality time with your provider. You may need to reschedule your appointment if you arrive late (15 or more minutes).  Arriving late affects you and other patients whose appointments are after yours.  Also, if you miss three or more appointments without notifying the office, you may be dismissed from the clinic at the provider's discretion.      For prescription refill requests, have your pharmacy contact our office and allow 72 hours for refills to be completed.    Today you received Venofer IV iron infusion.     BELOW ARE SYMPTOMS THAT SHOULD BE REPORTED IMMEDIATELY: *FEVER GREATER THAN 100.4 F (38 C) OR HIGHER *CHILLS OR SWEATING *NAUSEA AND VOMITING THAT IS NOT CONTROLLED WITH YOUR NAUSEA MEDICATION *UNUSUAL SHORTNESS OF BREATH *UNUSUAL BRUISING OR BLEEDING *URINARY PROBLEMS (pain or burning when urinating, or frequent urination) *BOWEL PROBLEMS (unusual diarrhea, constipation, pain near the anus) TENDERNESS IN MOUTH AND THROAT WITH OR WITHOUT PRESENCE OF ULCERS (sore throat, sores in mouth, or a toothache) UNUSUAL RASH, SWELLING OR PAIN  UNUSUAL VAGINAL DISCHARGE OR ITCHING   Items with * indicate a potential emergency and should be followed up as soon as possible or go to the Emergency Department if any problems should occur.  Please show the CHEMOTHERAPY ALERT CARD or IMMUNOTHERAPY ALERT CARD at check-in to the Emergency Department and triage nurse.  Should you have questions after your visit or need to cancel or reschedule your appointment, please contact  CANCER CENTER  [phone]  and follow the prompts.  Office hours are 8:00 a.m. to 4:30 p.m. Monday - Friday. Please note that voicemails left after 4:00 p.m. may not be returned until the following business day.  We are closed weekends and major holidays. You have access to a nurse at all times for urgent questions. Please call the main number to the clinic [phone] and follow the prompts.  For any non-urgent questions, you may also contact your provider using MyChart. We now offer e-Visits for anyone 18 and older to request care online for non-urgent symptoms. For details visit [email or web address].   Also download the MyChart app! Go to the app store, search "MyChart", open the app, select [facility], and log in with your MyChart username and password.  Due to Covid, a mask is required upon entering the hospital/clinic. If you do not have a mask, one will be given to you upon arrival. For doctor visits, patients may have 1 support person aged 18 or older with them. For treatment visits, patients cannot have anyone with them due to current Covid guidelines and our immunocompromised population.  

## 2021-09-12 NOTE — Progress Notes (Signed)
Pt presents today for Venofer IV iron infusion per provider's order. Vitals signs stable and pt c/o joint stiffness after her last iron infusion on her way home. Pt stated it stopped completely after an hour.  Pt also stated she was feeling joint stiffness in her legs after the iron infusion today.  Message sent to R.Pennington-PA and she stated some mild joint pain for 1-2 days after IV iron infusion is normal.  She can take Tylenol for symptom relief.  Severe joint pain that does not get better is not normal.  She should seek medical attention and let us know if she has severe joint pain or other concerning symptoms. Pt was told what R.Buford Dresser stated and pt verbalized understanding.  Peripheral IV started with good blood return pre and post infusion.  Venofer 300 mg IV iron infusion given today per MD orders. Tolerated infusion without adverse affects. Vital signs stable. No complaints at this time. Discharged from clinic ambulatory in stable condition. Alert and oriented x 3. F/U with [facility] as scheduled.

## 2021-09-19 ENCOUNTER — Encounter (HOSPITAL_COMMUNITY): Payer: Self-pay

## 2021-09-19 ENCOUNTER — Inpatient Hospital Stay (HOSPITAL_COMMUNITY): Payer: Managed Care, Other (non HMO)

## 2021-09-19 ENCOUNTER — Other Ambulatory Visit: Payer: Self-pay

## 2021-09-19 VITALS — BP 123/86 | HR 65 | Temp 96.8°F | Resp 18

## 2021-09-19 DIAGNOSIS — D509 Iron deficiency anemia, unspecified: Secondary | ICD-10-CM | POA: Diagnosis not present

## 2021-09-19 DIAGNOSIS — D508 Other iron deficiency anemias: Secondary | ICD-10-CM

## 2021-09-19 MED ORDER — SODIUM CHLORIDE 0.9 % IV SOLN
400.0000 mg | Freq: Once | INTRAVENOUS | Status: AC
  Administered 2021-09-19: 400 mg via INTRAVENOUS
  Filled 2021-09-19: qty 20

## 2021-09-19 MED ORDER — SODIUM CHLORIDE 0.9 % IV SOLN
Freq: Once | INTRAVENOUS | Status: AC
Start: 2021-09-19 — End: 2021-09-19

## 2021-09-19 NOTE — Progress Notes (Signed)
Chaplain engaged in a follow-up with Samantha Wang, catching up with her about the holiday season and current shows she is watching.  Today Samantha Wang's last treatment.  Chaplain affirmed and celebrated the time she has been able to get to know [place].    Chaplain has offered listening and presence.     09/19/21 1000  Clinical Encounter Type  Visited With Patient  Visit Type Follow-up

## 2021-09-19 NOTE — Patient Instructions (Signed)
[facility]  Discharge Instructions: Thank you for choosing [facility] to provide your oncology and hematology care.  If you have a lab appointment with the Cancer Center, please come in thru the Main Entrance and check in at the main information desk.  Wear comfortable clothing and clothing appropriate for easy access to any Portacath or PICC line.   We strive to give you quality time with your provider. You may need to reschedule your appointment if you arrive late (15 or more minutes).  Arriving late affects you and other patients whose appointments are after yours.  Also, if you miss three or more appointments without notifying the office, you may be dismissed from the clinic at the provider's discretion.      For prescription refill requests, have your pharmacy contact our office and allow 72 hours for refills to be completed.    Today you received the following: Venofer, return as scheduled.   To help prevent nausea and vomiting after your treatment, we encourage you to take your nausea medication as directed.  BELOW ARE SYMPTOMS THAT SHOULD BE REPORTED IMMEDIATELY: *FEVER GREATER THAN 100.4 F (38 C) OR HIGHER *CHILLS OR SWEATING *NAUSEA AND VOMITING THAT IS NOT CONTROLLED WITH YOUR NAUSEA MEDICATION *UNUSUAL SHORTNESS OF BREATH *UNUSUAL BRUISING OR BLEEDING *URINARY PROBLEMS (pain or burning when urinating, or frequent urination) *BOWEL PROBLEMS (unusual diarrhea, constipation, pain near the anus) TENDERNESS IN MOUTH AND THROAT WITH OR WITHOUT PRESENCE OF ULCERS (sore throat, sores in mouth, or a toothache) UNUSUAL RASH, SWELLING OR PAIN  UNUSUAL VAGINAL DISCHARGE OR ITCHING   Items with * indicate a potential emergency and should be followed up as soon as possible or go to the Emergency Department if any problems should occur.  Please show the CHEMOTHERAPY ALERT CARD or IMMUNOTHERAPY ALERT CARD at check-in to the Emergency Department and triage  nurse.  Should you have questions after your visit or need to cancel or reschedule your appointment, please contact [facility] [phone]  and follow the prompts.  Office hours are 8:00 a.m. to 4:30 p.m. Monday - Friday. Please note that voicemails left after 4:00 p.m. may not be returned until the following business day.  We are closed weekends and major holidays. You have access to a nurse at all times for urgent questions. Please call the main number to the clinic [phone] and follow the prompts.  For any non-urgent questions, you may also contact your provider using MyChart. We now offer e-Visits for anyone 18 and older to request care online for non-urgent symptoms. For details visit [email or web address].   Also download the MyChart app! Go to the app store, search "MyChart", open the app, select [facility], and log in with your MyChart username and password.  Due to Covid, a mask is required upon entering the hospital/clinic. If you do not have a mask, one will be given to you upon arrival. For doctor visits, patients may have 1 support person aged 18 or older with them. For treatment visits, patients cannot have anyone with them due to current Covid guidelines and our immunocompromised population.  

## 2021-09-19 NOTE — Progress Notes (Signed)
Patient tolerated iron infusion, patient states her ankles seem to be stiff and swollen, RN elevated patient's feet. RN verified with Dr. Ellin Saba about patient's joint stiffness and swelling, MD advised patient to take Tylenol when she got home. Peripheral IV site clean and dry with good blood return noted before and after infusion. Band aid applied. Patient declined AVS. VSS with discharge and left in satisfactory condition with no s/s of distress noted.

## 2021-10-05 ENCOUNTER — Encounter (HOSPITAL_COMMUNITY): Payer: Self-pay | Admitting: Hematology

## 2021-10-17 ENCOUNTER — Ambulatory Visit (INDEPENDENT_AMBULATORY_CARE_PROVIDER_SITE_OTHER): Payer: 59 | Admitting: Family Medicine

## 2021-10-17 ENCOUNTER — Encounter (HOSPITAL_COMMUNITY): Payer: Self-pay | Admitting: Hematology

## 2021-10-17 ENCOUNTER — Other Ambulatory Visit: Payer: Self-pay

## 2021-10-17 ENCOUNTER — Encounter: Payer: Self-pay | Admitting: Family Medicine

## 2021-10-17 VITALS — HR 65 | Temp 98.0°F | Ht 62.0 in | Wt 149.0 lb

## 2021-10-17 DIAGNOSIS — J019 Acute sinusitis, unspecified: Secondary | ICD-10-CM

## 2021-10-17 MED ORDER — AMOXICILLIN-POT CLAVULANATE 875-125 MG PO TABS: 1.0000 | ORAL_TABLET | Freq: Two times a day (BID) | ORAL | 0 refills | Status: DC

## 2021-10-17 NOTE — Patient Instructions (Addendum)
A prescription for Augmentin was sent into your pharmacy.  If you do not get over this over the next 10 days you may need a second round of antibiotics.  Feel free to call or send [place] a message if any ongoing issues.  TakeCare-Dr. Lorin Picket   sinusitis, Adult Sinusitis is inflammation of your sinuses. Sinuses are hollow spaces in the bones around your face. Your sinuses are located: Around your eyes. In the middle of your forehead. Behind your nose. In your cheekbones. Mucus normally drains out of your sinuses. When your nasal tissues become inflamed or swollen, mucus can become trapped or blocked. This allows bacteria, viruses, and fungi to grow, which leads to infection. Most infections of the sinuses are caused by a virus. Sinusitis can develop quickly. It can last for up to 4 weeks (acute) or for more than 12 weeks (chronic). Sinusitis often develops after a cold. What are the causes? This condition is caused by anything that creates swelling in the sinuses or stops mucus from draining. This includes: Allergies. Asthma. Infection from bacteria or viruses. Deformities or blockages in your nose or sinuses. Abnormal growths in the nose (nasal polyps). Pollutants, such as chemicals or irritants in the air. Infection from fungi (rare). What increases the risk? You are more likely to develop this condition if you: Have a weak body defense system (immune system). Do a lot of swimming or diving. Overuse nasal sprays. Smoke. What are the signs or symptoms? The main symptoms of this condition are pain and a feeling of pressure around the affected sinuses. Other symptoms include: Stuffy nose or congestion. Thick drainage from your nose. Swelling and warmth over the affected sinuses. Headache. Upper toothache. A cough that may get worse at night. Extra mucus that collects in the throat or the back of the nose (postnasal drip). Decreased sense of smell and taste. Fatigue. A fever. Sore  throat. Bad breath. How is this diagnosed? This condition is diagnosed based on: Your symptoms. Your medical history. A physical exam. Tests to find out if your condition is acute or chronic. This may include: Checking your nose for nasal polyps. Viewing your sinuses using a device that has a light (endoscope). Testing for allergies or bacteria. Imaging tests, such as an MRI or CT scan. In rare cases, a bone biopsy may be done to rule out more serious types of fungal sinus disease. How is this treated? Treatment for sinusitis depends on the cause and whether your condition is chronic or acute. If caused by a virus, your symptoms should go away on their own within 10 days. You may be given medicines to relieve symptoms. They include: Medicines that shrink swollen nasal passages (topical intranasal decongestants). Medicines that treat allergies (antihistamines). A spray that eases inflammation of the nostrils (topical intranasal corticosteroids). Rinses that help get rid of thick mucus in your nose (nasal saline washes). If caused by bacteria, your health care provider may recommend waiting to see if your symptoms improve. Most bacterial infections will get better without antibiotic medicine. You may be given antibiotics if you have: A severe infection. A weak immune system. If caused by narrow nasal passages or nasal polyps, you may need to have surgery. Follow these instructions at home: Medicines Take, use, or apply over-the-counter and prescription medicines only as told by your health care provider. These may include nasal sprays. If you were prescribed an antibiotic medicine, take it as told by your health care provider. Do not stop taking the antibiotic even  if you start to feel better. Hydrate and humidify  Drink enough fluid to keep your urine pale yellow. Staying hydrated will help to thin your mucus. Use a cool mist humidifier to keep the humidity level in your home above  50%. Inhale steam for 10-15 minutes, 3-4 times a day, or as told by your health care provider. You can do this in the bathroom while a hot shower is running. Limit your exposure to cool or dry air. Rest Rest as much as possible. Sleep with your head raised (elevated). Make sure you get enough sleep each night. General instructions  Apply a warm, moist washcloth to your face 3-4 times a day or as told by your health care provider. This will help with discomfort. Wash your hands often with soap and water to reduce your exposure to germs. If soap and water are not available, use hand sanitizer. Do not smoke. Avoid being around people who are smoking (secondhand smoke). Keep all follow-up visits as told by your health care provider. This is important. Contact a health care provider if: You have a fever. Your symptoms get worse. Your symptoms do not improve within 10 days. Get help right away if: You have a severe headache. You have persistent vomiting. You have severe pain or swelling around your face or eyes. You have vision problems. You develop confusion. Your neck is stiff. You have trouble breathing. Summary Sinusitis is soreness and inflammation of your sinuses. Sinuses are hollow spaces in the bones around your face. This condition is caused by nasal tissues that become inflamed or swollen. The swelling traps or blocks the flow of mucus. This allows bacteria, viruses, and fungi to grow, which leads to infection. If you were prescribed an antibiotic medicine, take it as told by your health care provider. Do not stop taking the antibiotic even if you start to feel better. Keep all follow-up visits as told by your health care provider. This is important. This information is not intended to replace advice given to you by your health care provider. Make sure you discuss any questions you have with your health care provider. Document Revised: 03/10/2018 Document Reviewed:  03/10/2018 Elsevier Patient Education  2022 [employer].

## 2021-10-17 NOTE — Progress Notes (Addendum)
Subjective:    Patient ID: Samantha Wang, female    DOB: Sep 02, 1971, 50 y.o.   MRN: [id number]  HPI This verifies that the history review of any tests, physical exam, assessment and plan were conducted by Dr. Lilyan Punt and documented accordingly by him today Lilyan Punt MD primary care [facility]  Congestion/ Sinus pressure / pain upper teeth facial pain x 2 weeks Covid test 1 week ago - negative  Patient presents today with respiratory illness Number of days present-greater than 10  Symptoms include-sinus congestion pressure pain and discomfort over the teeth  Presence of worrisome signs (severe shortness of breath, lethargy, etc.) -no wheezing or difficulty breathing  Recent/current visit to urgent care or ER-none  Recent direct exposure to Covid-none  Any current Covid testing-negative last week  Review of Systems     Objective:   Physical Exam Gen-NAD not toxic TMS-normal bilateral T- normal no redness Chest-CTA respiratory rate normal no crackles CV RRR no murmur Skin-warm dry Neuro-grossly normal Moderate sinus tenderness maxillary       Assessment & Plan:  Patient was seen today for upper respiratory illness. It is felt that the patient is dealing with sinusitis.  Antibiotics were prescribed today.  Compliance discussed.  If worsening symptoms or progressive illness notify us.  If emergency call 911 or go to ER.

## 2021-10-24 ENCOUNTER — Ambulatory Visit: Payer: Managed Care, Other (non HMO) | Admitting: Family Medicine

## 2021-11-30 ENCOUNTER — Other Ambulatory Visit: Payer: Self-pay

## 2021-11-30 ENCOUNTER — Encounter: Payer: Self-pay | Admitting: Obstetrics and Gynecology

## 2021-11-30 ENCOUNTER — Ambulatory Visit (INDEPENDENT_AMBULATORY_CARE_PROVIDER_SITE_OTHER): Payer: 59 | Admitting: Obstetrics and Gynecology

## 2021-11-30 ENCOUNTER — Other Ambulatory Visit (HOSPITAL_COMMUNITY)
Admission: RE | Admit: 2021-11-30 | Discharge: 2021-11-30 | Disposition: A | Discharge status: Home / Self Care | Payer: 59 | Source: Ambulatory Visit | Attending: Obstetrics and Gynecology | Admitting: Obstetrics and Gynecology

## 2021-11-30 VITALS — BP 118/74 | HR 76 | Ht 63.0 in | Wt 148.0 lb

## 2021-11-30 DIAGNOSIS — Z124 Encounter for screening for malignant neoplasm of cervix: Secondary | ICD-10-CM | POA: Diagnosis present

## 2021-11-30 DIAGNOSIS — Z01419 Encounter for gynecological examination (general) (routine) without abnormal findings: Secondary | ICD-10-CM

## 2021-11-30 NOTE — Patient Instructions (Signed)

## 2021-11-30 NOTE — Progress Notes (Signed)
51 y.o. G34P2002 Married Caucasian female here for annual exam.    No menses since 06/2021. Having hot flashes. Hot flashes to affect her sleep. She is doing ok for now.   Had some iron infusions a couple of months ago.  She will have follow up next month.  PCP:  Elmore Guise, DO  Patient's last menstrual period was 06/22/2021 (approximate).     Period Cycle (Days):  (no cycles since 06/2021)     Sexually active: Yes.    The current method of family planning is vasectomy.    Exercising: Yes.     Walks 30 minutes/day Smoker:  no  Health Maintenance: Pap:   03-13-18 neg, 03-08-16 Neg:Neg HR HPV, 01-11-14 Neg History of abnormal Pap:  no MMG:  03-22-21 Neg/BiRads1 Colonoscopy: 08-20-17 virtual colonoscopy--tortuous colon. 07-19-17 incomplete colonoscopy BMD:   03-11-15  Result :Normal TDaP:  2016  Gardasil:   no HIV: Neg in preg Hep C: 03-08-16 Neg Screening Labs:  PCP and hematology in [place]. Flu vaccine:  declined.  Covid vaccine:  declined.   reports that she has never smoked. She has never used smokeless tobacco. She reports that she does not currently use alcohol. She reports that she does not use drugs.  Past Medical History:  Diagnosis Date   Allergy    Anemia    Anxiety    Breast nodule    left    GERD (gastroesophageal reflux disease)    H/O cesarean section    history of 2 c-sections     History of hiatal hernia    Peptic ulcer disease    PONV (postoperative nausea and vomiting)    Reflux     Past Surgical History:  Procedure Laterality Date   BIOPSY  07/19/2017   Procedure: BIOPSY;  Surgeon: Rogene Houston, MD;  [facility];  Service: Endoscopy;;  duodenum   CESAREAN SECTION  [id number]   CESAREAN SECTION     x2   COLONOSCOPY N/A 07/19/2017   Procedure: COLONOSCOPY;  Surgeon: Rogene Houston, MD;  [facility];  Service: Endoscopy;  Laterality: N/A;   ESOPHAGOGASTRODUODENOSCOPY N/A 10/11/2015   Procedure: ESOPHAGOGASTRODUODENOSCOPY  (EGD);  Surgeon: Rogene Houston, MD;  [facility];  Service: Endoscopy;  Laterality: N/A;  1230   ESOPHAGOGASTRODUODENOSCOPY N/A 06/06/2016   Procedure: ESOPHAGOGASTRODUODENOSCOPY (EGD);  Surgeon: Rogene Houston, MD;  [facility];  Service: Endoscopy;  Laterality: N/A;  255   ESOPHAGOGASTRODUODENOSCOPY N/A 07/19/2017   Procedure: ESOPHAGOGASTRODUODENOSCOPY (EGD);  Surgeon: Rogene Houston, MD;  [facility];  Service: Endoscopy;  Laterality: N/A;  8:25   PARTIAL GASTRECTOMY  2017   ulcer surgery  08/2016   abdominal   UPPER GASTROINTESTINAL ENDOSCOPY      Current Outpatient Medications  Medication Sig Dispense Refill   clonazePAM (KLONOPIN) 0.5 MG tablet 1/2 to 1 po BID prn anxiety 30 tablet 2   fluticasone (FLONASE) 50 MCG/ACT nasal spray USE TWO SPRAY(S) IN EACH NOSTRIL ONCE DAILY AS NEEDED FOR CONGESTION --  **NEEDS  OFFICE  VISIT** 16 g 5   SUMAtriptan (IMITREX) 100 MG tablet TAKE 1 TABLET BY MOUTH AS NEEDED FOR MIGRAINE. MAY REPEAT ONCE AFTER 2 HOURS 10 tablet 1   No current facility-administered medications for this visit.    Family History  Problem Relation Age of Onset   Healthy Mother    Cancer Mother        brain cancer-2018   Hyperlipidemia Father  Healthy Brother    Healthy Son    Healthy Daughter    Breast cancer Maternal Grandmother     Review of Systems  All other systems reviewed and are negative.  Exam:   BP 118/74    Pulse 76    Ht 5\' 3"  (1.6 m)    Wt 148 lb (67.1 kg)    LMP 06/22/2021 (Approximate)    SpO2 99%    BMI 26.22 kg/m     General appearance: alert, cooperative and appears stated age Head: normocephalic, without obvious abnormality, atraumatic Neck: no adenopathy, supple, symmetrical, trachea midline and thyroid normal to inspection and palpation Lungs: clear to auscultation bilaterally Breasts: normal appearance, no masses or tenderness, No nipple retraction or dimpling, No nipple discharge or bleeding, No  axillary adenopathy Heart: regular rate and rhythm Abdomen: soft, non-tender; no masses, no organomegaly Extremities: extremities normal, atraumatic, no cyanosis or edema Skin: skin color, texture, turgor normal. No rashes or lesions Lymph nodes: cervical, supraclavicular, and axillary nodes normal. Neurologic: grossly normal  Pelvic: External genitalia:  no lesions              No abnormal inguinal nodes palpated.              Urethra:  normal appearing urethra with no masses, tenderness or lesions              Bartholins and Skenes: normal                 Vagina: normal appearing vagina with normal color and discharge, no lesions              Cervix: no lesions              Pap taken: yes Bimanual Exam:  Uterus:  normal size, contour, position, consistency, mobility, non-tender              Adnexa: no mass, fullness, tenderness              Rectal exam: Yes.  .  Confirms.              Anus:  normal sphincter tone, no lesions  Chaperone was present for exam:  Estill Bamberg, CMA  Assessment:   Well woman visit with gynecologic exam. Perimenopausal female.   Plan: Mammogram screening discussed. Self breast awareness reviewed. Pap and HR HPV collected. Guidelines for Calcium, Vitamin D, regular exercise program including cardiovascular and weight bearing exercise.   Follow up annually and prn.   After visit summary provided.

## 2021-12-05 LAB — CYTOLOGY - PAP
Comment: NEGATIVE
Diagnosis: NEGATIVE
Diagnosis: REACTIVE
High risk HPV: NEGATIVE

## 2021-12-08 ENCOUNTER — Encounter (HOSPITAL_COMMUNITY): Payer: Self-pay | Admitting: Hematology

## 2022-01-02 ENCOUNTER — Inpatient Hospital Stay (HOSPITAL_COMMUNITY): Payer: 59 | Attending: Hematology

## 2022-01-02 DIAGNOSIS — D509 Iron deficiency anemia, unspecified: Secondary | ICD-10-CM | POA: Diagnosis not present

## 2022-01-02 LAB — CBC WITH DIFFERENTIAL/PLATELET
Abs Immature Granulocytes: 0.01 10*3/uL (ref 0.00–0.07)
Basophils Absolute: 0 10*3/uL (ref 0.0–0.1)
Basophils Relative: 0 %
Eosinophils Absolute: 0.1 10*3/uL (ref 0.0–0.5)
Eosinophils Relative: 2 %
HCT: 41.6 % (ref 36.0–46.0)
Hemoglobin: 13.7 g/dL (ref 12.0–15.0)
Immature Granulocytes: 0 %
Lymphocytes Relative: 31 %
Lymphs Abs: 1.6 10*3/uL (ref 0.7–4.0)
MCH: 30.2 pg (ref 26.0–34.0)
MCHC: 32.9 g/dL (ref 30.0–36.0)
MCV: 91.6 fL (ref 80.0–100.0)
Monocytes Absolute: 0.3 10*3/uL (ref 0.1–1.0)
Monocytes Relative: 6 %
Neutro Abs: 3.3 10*3/uL (ref 1.7–7.7)
Neutrophils Relative %: 61 %
Platelets: 275 10*3/uL (ref 150–400)
RBC: 4.54 MIL/uL (ref 3.87–5.11)
RDW: 13.4 % (ref 11.5–15.5)
WBC: 5.4 10*3/uL (ref 4.0–10.5)
nRBC: 0 % (ref 0.0–0.2)

## 2022-01-02 LAB — FERRITIN: Ferritin: 60 ng/mL (ref 11–307)

## 2022-01-02 LAB — IRON AND TIBC
Iron: 103 ug/dL (ref 28–170)
Saturation Ratios: 34 % — ABNORMAL HIGH (ref 10.4–31.8)
TIBC: 301 ug/dL (ref 250–450)
UIBC: 198 ug/dL

## 2022-01-02 LAB — VITAMIN B12: Vitamin B-12: 217 pg/mL (ref 180–914)

## 2022-01-02 LAB — FOLATE: Folate: 21.2 ng/mL (ref >5.9)

## 2022-01-04 LAB — METHYLMALONIC ACID, SERUM: Methylmalonic Acid, Quantitative: 143 nmol/L (ref 0–378)

## 2022-01-08 NOTE — Progress Notes (Signed)
? ?[facility] ?[address] ?[place], [place] [address] ? ? ?CLINIC:  ?Medical Oncology/Hematology ? ?PCP:  ?Tommie Sams, DO ?[address] ?[place] [address] ?[phone] ? ? ?REASON FOR VISIT:  ?Follow-up for iron deficiency anemia ? ?CURRENT THERAPY: Intermittent IV iron ? ?INTERVAL HISTORY:  ?Ms. Samantha Wang 51 y.o. female returns for routine follow-up of her iron deficiency anemia.  She was seen for initial consultation by Dr. Ellin Saba on 09/01/2021 and received IV iron with Venofer 1000 mg in divided doses from 09/05/2021 through 09/19/2021. ? ?At today's visit, she reports feeling fairly well.  No recent hospitalizations, surgeries, or changes in baseline health status. ? ?She reports that she had joint pain and swelling after each of her IV iron infusions, and that her left hand has been aching and throbbing since that time.  She has been seeing an orthopedist for further work-up of her left hand pain. ?Otherwise, she reports that she did not feel any different after her IV iron infusions. ?She denies any blood loss such as hematemesis, hematochezia, melena, or epistaxis. ?She denies any unusual fatigue and reports that her energy is 80%, at baseline. ?She denies any pica, restless legs, headaches, chest pain, dyspnea on exertion, lightheadedness, and syncope. ? ?She has 80% energy and 100% appetite. She endorses that she is maintaining a stable weight. ? ? ?REVIEW OF SYSTEMS:  ?Review of Systems  ?Constitutional:  Negative for appetite change, chills, diaphoresis, fatigue, fever and unexpected weight change.  ?HENT:   Negative for lump/mass and nosebleeds.   ?Eyes:  Negative for eye problems.  ?Respiratory:  Negative for cough, hemoptysis and shortness of breath.   ?Cardiovascular:  Negative for chest pain, leg swelling and palpitations.  ?Gastrointestinal:  Negative for abdominal pain, blood in stool, constipation, diarrhea, nausea and vomiting.  ?Genitourinary:  Negative for hematuria.    ?Musculoskeletal:  Positive for arthralgias (after IV iron).  ?Skin: Negative.   ?Neurological:  Negative for dizziness, headaches and light-headedness.  ?Hematological:  Does not bruise/bleed easily.   ? ? ?PAST MEDICAL/SURGICAL HISTORY:  ?Past Medical History:  ?Diagnosis Date  ? Allergy   ? Anemia   ? Anxiety   ? Breast nodule   ? left   ? GERD (gastroesophageal reflux disease)   ? H/O cesarean section   ? history of 2 c-sections    ? History of hiatal hernia   ? Peptic ulcer disease   ? PONV (postoperative nausea and vomiting)   ? Reflux   ? ?Past Surgical History:  ?Procedure Laterality Date  ? BIOPSY  07/19/2017  ? Procedure: BIOPSY;  Surgeon: Malissa Hippo, MD;  [facility];  Service: Endoscopy;;  duodenum  ? CESAREAN SECTION  [phone]  ? CESAREAN SECTION    ? x2  ? COLONOSCOPY N/A 07/19/2017  ? Procedure: COLONOSCOPY;  Surgeon: Malissa Hippo, MD;  [facility];  Service: Endoscopy;  Laterality: N/A;  ? ESOPHAGOGASTRODUODENOSCOPY N/A 10/11/2015  ? Procedure: ESOPHAGOGASTRODUODENOSCOPY (EGD);  Surgeon: Malissa Hippo, MD;  [facility];  Service: Endoscopy;  Laterality: N/A;  1230  ? ESOPHAGOGASTRODUODENOSCOPY N/A 06/06/2016  ? Procedure: ESOPHAGOGASTRODUODENOSCOPY (EGD);  Surgeon: Malissa Hippo, MD;  [facility];  Service: Endoscopy;  Laterality: N/A;  255  ? ESOPHAGOGASTRODUODENOSCOPY N/A 07/19/2017  ? Procedure: ESOPHAGOGASTRODUODENOSCOPY (EGD);  Surgeon: Malissa Hippo, MD;  [facility];  Service: Endoscopy;  Laterality: N/A;  8:25  ? PARTIAL GASTRECTOMY  2017  ?  ulcer surgery  08/2016  ? abdominal  ? UPPER GASTROINTESTINAL ENDOSCOPY    ? ? ? ?SOCIAL HISTORY:  ?Social History  ? ?Socioeconomic History  ? Marital status: Married  ?  Spouse name: Not on file  ? Number of children: Not on file  ? Years of education: Not on file  ? Highest education level: Not on file  ?Occupational History  ? Not on file  ?Tobacco Use  ? Smoking status: Never   ? Smokeless tobacco: Never  ?Vaping Use  ? Vaping Use: Never used  ?Substance and Sexual Activity  ? Alcohol use: Not Currently  ? Drug use: No  ? Sexual activity: Yes  ?  Partners: Male  ?  Birth control/protection: Other-see comments  ?  Comment: spouse-vasectomy   ?Other Topics Concern  ? Not on file  ?Social History Narrative  ? Not on file  ? ?Social Determinants of Health  ? ?Financial Resource Strain: Not on file  ?Food Insecurity: Not on file  ?Transportation Needs: Not on file  ?Physical Activity: Not on file  ?Stress: Not on file  ?Social Connections: Not on file  ?Intimate Partner Violence: Not on file  ? ? ?FAMILY HISTORY:  ?Family History  ?Problem Relation Age of Onset  ? Healthy Mother   ? Cancer Mother   ?     brain cancer-2018  ? Hyperlipidemia Father   ? Healthy Brother   ? Healthy Son   ? Healthy Daughter   ? Breast cancer Maternal Grandmother   ? ? ?CURRENT MEDICATIONS:  ?Outpatient Encounter Medications as of 01/09/2022  ?Medication Sig  ? clonazePAM (KLONOPIN) 0.5 MG tablet 1/2 to 1 po BID prn anxiety  ? fluticasone (FLONASE) 50 MCG/ACT nasal spray USE TWO SPRAY(S) IN EACH NOSTRIL ONCE DAILY AS NEEDED FOR CONGESTION --  **NEEDS  OFFICE  VISIT**  ? SUMAtriptan (IMITREX) 100 MG tablet TAKE 1 TABLET BY MOUTH AS NEEDED FOR MIGRAINE. MAY REPEAT ONCE AFTER 2 HOURS  ? ?No facility-administered encounter medications on file as of 01/09/2022.  ? ? ?ALLERGIES:  ?No Known Allergies ? ? ?PHYSICAL EXAM:  ?ECOG PERFORMANCE STATUS: 1 - Symptomatic but completely ambulatory ? ?There were no vitals filed for this visit. ?There were no vitals filed for this visit. ?Physical Exam ?Constitutional:   ?   Appearance: Normal appearance.  ?HENT:  ?   Head: Normocephalic and atraumatic.  ?   Mouth/Throat:  ?   Mouth: Mucous membranes are moist.  ?Eyes:  ?   Extraocular Movements: Extraocular movements intact.  ?   Pupils: Pupils are equal, round, and reactive to light.  ?Cardiovascular:  ?   Rate and Rhythm: Normal rate  and regular rhythm.  ?   Pulses: Normal pulses.  ?   Heart sounds: Normal heart sounds.  ?Pulmonary:  ?   Effort: Pulmonary effort is normal.  ?   Breath sounds: Normal breath sounds.  ?Abdominal:  ?   General: Bowel sounds are normal.  ?   Palpations: Abdomen is soft.  ?   Tenderness: There is no abdominal tenderness.  ?Musculoskeletal:     ?   General: No swelling.  ?   Right lower leg: No edema.  ?   Left lower leg: No edema.  ?Lymphadenopathy:  ?   Cervical: No cervical adenopathy.  ?Skin: ?   General: Skin is warm and dry.  ?Neurological:  ?   General: No focal deficit present.  ?   Mental Status: She is alert and  oriented to person, place, and time.  ?Psychiatric:     ?   Mood and Affect: Mood normal.     ?   Behavior: Behavior normal.  ? ? ? ?LABORATORY DATA:  ?I have reviewed the labs as listed.  ?CBC ?   ?Component Value Date/Time  ? WBC 5.4 01/02/2022 1506  ? RBC 4.54 01/02/2022 1506  ? HGB 13.7 01/02/2022 1506  ? HGB 11.9 08/15/2021 1450  ? HCT 41.6 01/02/2022 1506  ? HCT 37.2 08/15/2021 1450  ? PLT 275 01/02/2022 1506  ? PLT 273 08/15/2021 1450  ? MCV 91.6 01/02/2022 1506  ? MCV 86 08/15/2021 1450  ? MCH 30.2 01/02/2022 1506  ? MCHC 32.9 01/02/2022 1506  ? RDW 13.4 01/02/2022 1506  ? RDW 14.5 08/15/2021 1450  ? LYMPHSABS 1.6 01/02/2022 1506  ? MONOABS 0.3 01/02/2022 1506  ? EOSABS 0.1 01/02/2022 1506  ? BASOSABS 0.0 01/02/2022 1506  ? ?CMP Latest Ref Rng & Units 08/15/2021 07/29/2019 03/13/2018  ?Glucose 70 - 99 mg/dL 91 91 87  ?BUN 6 - 24 mg/dL 9 12 11   ?Creatinine 0.57 - 1.00 mg/dL 4.540.70 0.980.63 1.190.74  ?Sodium 134 - 144 mmol/L 141 139 140  ?Potassium 3.5 - 5.2 mmol/L 4.2 4.1 4.1  ?Chloride 96 - 106 mmol/L 104 102 104  ?CO2 20 - 29 mmol/L 23 24 23   ?Calcium 8.7 - 10.2 mg/dL 9.1 8.9 9.0  ?Total Protein 6.0 - 8.5 g/dL 7.1 7.0 7.4  ?Total Bilirubin 0.0 - 1.2 mg/dL 0.3 0.2 0.3  ?Alkaline Phos 44 - 121 IU/L 55 54 49  ?AST 0 - 40 IU/L 15 16 14   ?ALT 0 - 32 IU/L 8 10 10   ? ? ?DIAGNOSTIC IMAGING:  ?I have  independently reviewed the relevant imaging and discussed with the patient. ? ?ASSESSMENT & PLAN: ?1.  Iron deficiency anemia secondary to decreased absorption ?- History of partial gastrectomy (antrectomy/vag

## 2022-01-09 ENCOUNTER — Inpatient Hospital Stay (HOSPITAL_COMMUNITY): Payer: 59 | Admitting: Physician Assistant

## 2022-01-09 ENCOUNTER — Other Ambulatory Visit: Payer: Self-pay

## 2022-01-09 VITALS — BP 134/86 | HR 64 | Temp 98.9°F | Resp 18 | Ht 62.0 in | Wt 152.6 lb

## 2022-01-09 DIAGNOSIS — D509 Iron deficiency anemia, unspecified: Secondary | ICD-10-CM

## 2022-01-09 NOTE — Patient Instructions (Signed)
[facility] at [facility] ?Discharge Instructions ? ?You were seen today by Rojelio Brenner PA-C for your iron deficiency anemia.  Your labs looked good today, so you do not need any IV iron at this time.  We check labs and follow-up with a phone visit in 4 months.   ? ? ? ?Thank you for choosing [facility] at [facility] to provide your oncology and hematology care.  To afford each patient quality time with our provider, please arrive at least 15 minutes before your scheduled appointment time.  ? ?If you have a lab appointment with the Cancer Center please come in thru the Main Entrance and check in at the main information desk. ? ?You need to re-schedule your appointment should you arrive 10 or more minutes late.  We strive to give you quality time with our providers, and arriving late affects you and other patients whose appointments are after yours.  Also, if you no show three or more times for appointments you may be dismissed from the clinic at the providers discretion.     ?Again, thank you for choosing [facility].  Our hope is that these requests will decrease the amount of time that you wait before being seen by our physicians.       ?_____________________________________________________________ ? ?Should you have questions after your visit to [facility], please contact our office at [phone] and follow the prompts.  Our office hours are 8:00 a.m. and 4:30 p.m. Monday - Friday.  Please note that voicemails left after 4:00 p.m. may not be returned until the following business day.  We are closed weekends and major holidays.  You do have access to a nurse 24-7, just call the main number to the clinic [phone] and do not press any options, hold on the line and a nurse will answer the phone.   ? ?For prescription refill requests, have your pharmacy contact our office and allow 72 hours.   ? ?Due to Covid, you will need to  wear a mask upon entering the hospital. If you do not have a mask, a mask will be given to you at the Main Entrance upon arrival. For doctor visits, patients may have 1 support person age 33 or older with them. For treatment visits, patients can not have anyone with them due to social distancing guidelines and our immunocompromised population.  ? ? ? ?

## 2022-01-24 ENCOUNTER — Ambulatory Visit: Payer: 59 | Admitting: Family Medicine

## 2022-01-24 ENCOUNTER — Encounter: Payer: Self-pay | Admitting: Family Medicine

## 2022-01-24 VITALS — BP 139/86 | HR 62 | Temp 97.6°F | Ht 62.0 in | Wt 153.0 lb

## 2022-01-24 DIAGNOSIS — J019 Acute sinusitis, unspecified: Secondary | ICD-10-CM | POA: Diagnosis not present

## 2022-01-24 MED ORDER — AMOXICILLIN-POT CLAVULANATE 875-125 MG PO TABS: 1.0000 | ORAL_TABLET | Freq: Two times a day (BID) | ORAL | 0 refills | Status: DC

## 2022-01-24 NOTE — Assessment & Plan Note (Signed)
Treating with Augmentin. 

## 2022-01-24 NOTE — Progress Notes (Signed)
? ?Subjective:  ?Patient ID: Samantha Wang, female    DOB: 04/05/1971  Age: 51 y.o. MRN: [id number] ? ?CC: ?Chief Complaint  ?Patient presents with  ? sinus congestion  ?  L Ear fullness x 3 days   ? ? ?HPI: ? ?51 year old female presents for evaluation of the above ? ?Patient reports that she has been sick for the past week.  She reports sinus pressure and congestion.  She reports left ear pain and fullness.  No fever.  Symptoms do not appear to be improving.  She has been using NyQuil and a decongestant without relief.  No reported sick contacts.  No known exacerbating factors.  No other complaints. ? ?Patient Active Problem List  ? Diagnosis Date Noted  ? Acute sinusitis 01/24/2022  ? Prediabetes 08/15/2021  ? Menstrual migraine 08/15/2021  ? Iron deficiency anemia 02/01/2021  ? Depression 04/18/2015  ? Insomnia 06/23/2014  ? Anxiety 06/10/2013  ? GERD (gastroesophageal reflux disease) 10/06/2012  ? PUD (peptic ulcer disease) 10/06/2012  ? IBS (irritable bowel syndrome) 10/06/2012  ? ? ?Social Hx   ?Social History  ? ?Socioeconomic History  ? Marital status: Married  ?  Spouse name: Not on file  ? Number of children: Not on file  ? Years of education: Not on file  ? Highest education level: Not on file  ?Occupational History  ? Not on file  ?Tobacco Use  ? Smoking status: Never  ? Smokeless tobacco: Never  ?Vaping Use  ? Vaping Use: Never used  ?Substance and Sexual Activity  ? Alcohol use: Not Currently  ? Drug use: No  ? Sexual activity: Yes  ?  Partners: Male  ?  Birth control/protection: Other-see comments  ?  Comment: spouse-vasectomy   ?Other Topics Concern  ? Not on file  ?Social History Narrative  ? Not on file  ? ?Social Determinants of Health  ? ?Financial Resource Strain: Not on file  ?Food Insecurity: Not on file  ?Transportation Needs: Not on file  ?Physical Activity: Not on file  ?Stress: Not on file  ?Social Connections: Not on file  ? ? ?Review of Systems ?Per HPI ? ?Objective:  ?BP 139/86    Pulse 62   Temp 97.6 ?F (36.4 ?C)   Ht 5\' 2"  (1.575 m)   Wt 153 lb (69.4 kg)   SpO2 98%   BMI 27.98 kg/m?  ? ? ?  01/24/2022  ?  3:04 PM 01/09/2022  ?  2:39 PM 11/30/2021  ?  1:28 PM  ?BP/Weight  ?Systolic BP 139 134 118  ?Diastolic BP 86 86 74  ?Wt. (Lbs) 153 152.6 148  ?BMI 27.98 kg/m2 27.91 kg/m2 26.22 kg/m2  ? ? ?Physical Exam ?Vitals and nursing note reviewed.  ?Constitutional:   ?   General: She is not in acute distress. ?   Appearance: Normal appearance.  ?HENT:  ?   Head: Normocephalic and atraumatic.  ?   Right Ear: Tympanic membrane normal.  ?   Ears:  ?   Comments: Left TM with mild erythema. ?   Nose: Congestion present.  ?Eyes:  ?   General:     ?   Right eye: No discharge.     ?   Left eye: No discharge.  ?   Conjunctiva/sclera: Conjunctivae normal.  ?Cardiovascular:  ?   Rate and Rhythm: Normal rate and regular rhythm.  ?Pulmonary:  ?   Effort: Pulmonary effort is normal.  ?   Breath sounds: Normal breath sounds.  No wheezing, rhonchi or rales.  ?Neurological:  ?   Mental Status: She is alert.  ? ? ?Lab Results  ?Component Value Date  ? WBC 5.4 01/02/2022  ? HGB 13.7 01/02/2022  ? HCT 41.6 01/02/2022  ? PLT 275 01/02/2022  ? GLUCOSE 91 08/15/2021  ? CHOL 165 07/29/2019  ? TRIG 48 07/29/2019  ? HDL 68 07/29/2019  ? LDLCALC 87 07/29/2019  ? ALT 8 08/15/2021  ? AST 15 08/15/2021  ? NA 141 08/15/2021  ? K 4.2 08/15/2021  ? CL 104 08/15/2021  ? CREATININE 0.70 08/15/2021  ? BUN 9 08/15/2021  ? CO2 23 08/15/2021  ? TSH 1.450 07/29/2019  ? HGBA1C 5.9 (H) 08/15/2021  ? ? ? ?Assessment & Plan:  ? ?Problem List Items Addressed This Visit   ? ?  ? Respiratory  ? Acute sinusitis - Primary  ?  Treating with Augmentin. ?  ?  ? Relevant Medications  ? amoxicillin-clavulanate (AUGMENTIN) 875-125 MG tablet  ? ? ?Meds ordered this encounter  ?Medications  ? amoxicillin-clavulanate (AUGMENTIN) 875-125 MG tablet  ?  Sig: Take 1 tablet by mouth 2 (two) times daily.  ?  Dispense:  14 tablet  ?  Refill:  0  ? ?Everlene Other  DO ?[facility] ? ?

## 2022-04-20 ENCOUNTER — Other Ambulatory Visit: Payer: Self-pay | Admitting: Family Medicine

## 2022-04-20 DIAGNOSIS — Z1231 Encounter for screening mammogram for malignant neoplasm of breast: Secondary | ICD-10-CM

## 2022-05-08 ENCOUNTER — Inpatient Hospital Stay (HOSPITAL_COMMUNITY): Payer: 59

## 2022-05-09 ENCOUNTER — Ambulatory Visit
Admission: RE | Admit: 2022-05-09 | Discharge: 2022-05-09 | Disposition: A | Discharge status: Home / Self Care | Payer: 59 | Source: Ambulatory Visit | Attending: Family Medicine | Admitting: Family Medicine

## 2022-05-09 DIAGNOSIS — Z1231 Encounter for screening mammogram for malignant neoplasm of breast: Secondary | ICD-10-CM

## 2022-05-15 ENCOUNTER — Telehealth (HOSPITAL_COMMUNITY): Payer: 59 | Admitting: Physician Assistant

## 2022-06-10 ENCOUNTER — Ambulatory Visit
Admission: EM | Admit: 2022-06-10 | Discharge: 2022-06-10 | Disposition: A | Discharge status: Home / Self Care | Payer: 59

## 2022-06-10 DIAGNOSIS — W57XXXA Bitten or stung by nonvenomous insect and other nonvenomous arthropods, initial encounter: Secondary | ICD-10-CM

## 2022-06-10 DIAGNOSIS — S30860A Insect bite (nonvenomous) of lower back and pelvis, initial encounter: Secondary | ICD-10-CM | POA: Diagnosis not present

## 2022-06-10 MED ORDER — DOXYCYCLINE HYCLATE 100 MG PO TABS: 100.0000 mg | ORAL_TABLET | Freq: Two times a day (BID) | ORAL | 0 refills | Status: AC

## 2022-06-10 NOTE — Discharge Instructions (Addendum)
Take medication as prescribed. Continue use of Neosporin to the site that you are currently using. May take over-the-counter Tylenol or ibuprofen for pain, fever, or general discomfort. Continue to monitor the area for worsening to include increased redness, swelling, drainage, or if you develop fever, chills, or other concerns. If symptoms fail to improve, please follow-up with your primary care physician.

## 2022-06-10 NOTE — ED Triage Notes (Signed)
Pt reports insect bite in right flank x 1 week. Reports area is red and itching. Pt using Neosporin.

## 2022-06-10 NOTE — ED Provider Notes (Signed)
[facility]    CSN: [id number] Arrival date & time: 06/10/22  1046      History   Chief Complaint Chief Complaint  Patient presents with   Insect Bite    HPI Samantha Wang is a 51 y.o. female.   The history is provided by the patient.   Patient presents for an insect bite that occurred approximately 1 week ago to the right flank.  Patient states that she has been using Neosporin to the area, but symptoms are slow to improve.  She complains of intermittent itching.  She states the area of redness has become larger over the past several days.  Patient states that she did scratch the area once and felt like she scratched off the scab.  She denies fever, chills, fatigue, chest pain, abdominal pain, nausea, vomiting, or diarrhea.  Patient states that her husband was concerned that this may have been a tick.  Patient states that she did have crawl under around a low-speed portion of her home to get a key when she was locked out of the house earlier this week prior to her symptoms starting.  She denies seeing any kind of insect or tick. Past Medical History:  Diagnosis Date   Allergy    Anemia    Anxiety    Breast nodule    left    GERD (gastroesophageal reflux disease)    H/O cesarean section    history of 2 c-sections     History of hiatal hernia    Peptic ulcer disease    PONV (postoperative nausea and vomiting)    Reflux     Patient Active Problem List   Diagnosis Date Noted   Acute sinusitis 01/24/2022   Prediabetes 08/15/2021   Menstrual migraine 08/15/2021   Iron deficiency anemia 02/01/2021   Depression 04/18/2015   Insomnia 06/23/2014   Anxiety 06/10/2013   GERD (gastroesophageal reflux disease) 10/06/2012   PUD (peptic ulcer disease) 10/06/2012   IBS (irritable bowel syndrome) 10/06/2012    Past Surgical History:  Procedure Laterality Date   BIOPSY  07/19/2017   Procedure: BIOPSY;  Surgeon: Malissa Hippo, MD;  [facility];   Service: Endoscopy;;  duodenum   CESAREAN SECTION  4098,1191   CESAREAN SECTION     x2   COLONOSCOPY N/A 07/19/2017   Procedure: COLONOSCOPY;  Surgeon: Malissa Hippo, MD;  Location: [facility];  Service: Endoscopy;  Laterality: N/A;   ESOPHAGOGASTRODUODENOSCOPY N/A 10/11/2015   Procedure: ESOPHAGOGASTRODUODENOSCOPY (EGD);  Surgeon: Malissa Hippo, MD;  [facility]: [facility];  Service: Endoscopy;  Laterality: N/A;  1230   ESOPHAGOGASTRODUODENOSCOPY N/A 06/06/2016   Procedure: ESOPHAGOGASTRODUODENOSCOPY (EGD);  Surgeon: Malissa Hippo, MD;  [facility];  Service: Endoscopy;  Laterality: N/A;  255   ESOPHAGOGASTRODUODENOSCOPY N/A 07/19/2017   Procedure: ESOPHAGOGASTRODUODENOSCOPY (EGD);  Surgeon: Malissa Hippo, MD;  Location: [facility];  Service: Endoscopy;  Laterality: N/A;  8:25   PARTIAL GASTRECTOMY  2017   ulcer surgery  08/2016   abdominal   UPPER GASTROINTESTINAL ENDOSCOPY      OB History     Gravida  2   Para  2   Term  2   Preterm      AB      Living  2      SAB      IAB      Ectopic      Multiple      Live Births  Home Medications    Prior to Admission medications   Medication Sig Start Date End Date Taking? Authorizing Provider  Bacitracin-Polymyxin B (NEOSPORIN EX) Apply topically.   Yes [provider]  doxycycline (VIBRA-TABS) 100 MG tablet Take 1 tablet (100 mg total) by mouth 2 (two) times daily for 7 days. 06/10/22 06/17/22 Yes Alvetta Hidrogo-Warren, Sadie Haber, NP  amoxicillin-clavulanate (AUGMENTIN) 875-125 MG tablet Take 1 tablet by mouth 2 (two) times daily. 01/24/22   Tommie Sams, DO  clonazePAM (KLONOPIN) 0.5 MG tablet 1/2 to 1 po BID prn anxiety 08/15/21   Cook, Jayce G, DO  fluticasone (FLONASE) 50 MCG/ACT nasal spray USE TWO SPRAY(S) IN EACH NOSTRIL ONCE DAILY AS NEEDED FOR CONGESTION --  **NEEDS  OFFICE  VISIT** 09/05/15   Merlyn Albert, MD  loratadine (CLARITIN) 10 MG tablet Take 1 tablet by  mouth daily.    [provider]  SUMAtriptan (IMITREX) 100 MG tablet TAKE 1 TABLET BY MOUTH AS NEEDED FOR MIGRAINE. MAY REPEAT ONCE AFTER 2 HOURS 08/15/21   Tommie Sams, DO    Family History Family History  Problem Relation Age of Onset   Healthy Mother    Cancer Mother        brain cancer-2018   Hyperlipidemia Father    Healthy Brother    Healthy Son    Healthy Daughter    Breast cancer Maternal Grandmother     Social History Social History   Tobacco Use   Smoking status: Never   Smokeless tobacco: Never  Vaping Use   Vaping Use: Never used  Substance Use Topics   Alcohol use: Not Currently   Drug use: No     Allergies   Patient has no known allergies.   Review of Systems Review of Systems Per HPI  Physical Exam Triage Vital Signs ED Triage Vitals  Enc Vitals Group     BP 06/10/22 1204 130/83     Pulse Rate 06/10/22 1204 80     Resp 06/10/22 1204 18     Temp 06/10/22 1204 98 F (36.7 C)     Temp Source 06/10/22 1204 Oral     SpO2 06/10/22 1204 99 %     Weight --      Height --      Head Circumference --      Peak Flow --      Pain Score 06/10/22 1202 0     Pain Loc --      Pain Edu? --      Excl. in GC? --    No data found.  Updated Vital Signs BP 130/83 (BP Location: Right Arm)   Pulse 80   Temp 98 F (36.7 C) (Oral)   Resp 18   LMP  (Within Weeks) Comment: 2 weeks  SpO2 99%   Visual Acuity Right Eye Distance:   Left Eye Distance:   Bilateral Distance:    Right Eye Near:   Left Eye Near:    Bilateral Near:     Physical Exam Vitals and nursing note reviewed.  Constitutional:      General: She is not in acute distress.    Appearance: Normal appearance.  HENT:     Head: Normocephalic.  Eyes:     Extraocular Movements: Extraocular movements intact.     Conjunctiva/sclera: Conjunctivae normal.     Pupils: Pupils are equal, round, and reactive to light.  Cardiovascular:     Rate and Rhythm: Normal rate and regular  rhythm.  Pulses: Normal pulses.     Heart sounds: Normal heart sounds.  Pulmonary:     Effort: Pulmonary effort is normal.     Breath sounds: Normal breath sounds.  Abdominal:     General: Bowel sounds are normal.     Palpations: Abdomen is soft.  Skin:    General: Skin is warm and dry.     Findings: Erythema and rash present.     Comments: Induration noted to the right flank with a central punctum.  Area is surrounded by an erythematous base.  There is no oozing, drainage, or fluctuance present.  Neurological:     General: No focal deficit present.     Mental Status: She is alert and oriented to person, place, and time.  Psychiatric:        Mood and Affect: Mood normal.        Behavior: Behavior normal.      UC Treatments / Results  Labs (all labs ordered are listed, but only abnormal results are displayed) Labs Reviewed - No data to display  EKG   Radiology No results found.  Procedures Procedures (including critical care time)  Medications Ordered in UC Medications - No data to display  Initial Impression / Assessment and Plan / UC Course  I have reviewed the triage vital signs and the nursing notes.  Pertinent labs & imaging results that were available during my care of the patient were reviewed by me and considered in my medical decision making (see chart for details).  Patient presents with possible insect bite to the right flank that occurred over 1 week ago.  Patient has been using over-the-counter ointment without relief.  Patient's vital signs are stable, exam is reassuring, patient is in no acute distress.  Patient is unsure of what possibly may have caused her symptoms.  We will cover patient with doxycycline as this will cover for soft tissue skin infection to include tick bite.  Supportive care recommendations were provided to the patient.  Patient was given strict indications of when to follow-up with this clinic.  Patient advised to follow-up with her  primary care physician if symptoms fail to improve. Final Clinical Impressions(s) / UC Diagnoses   Final diagnoses:  Insect bite of lower back, initial encounter     Discharge Instructions      Take medication as prescribed. Continue use of Neosporin to the site that you are currently using. May take over-the-counter Tylenol or ibuprofen for pain, fever, or general discomfort. Continue to monitor the area for worsening to include increased redness, swelling, drainage, or if you develop fever, chills, or other concerns. If symptoms fail to improve, please follow-up with your primary care physician.     ED Prescriptions     Medication Sig Dispense Auth. Provider   doxycycline (VIBRA-TABS) 100 MG tablet Take 1 tablet (100 mg total) by mouth 2 (two) times daily for 7 days. 14 tablet Brexton Sofia-Warren, Sadie Haber, NP      PDMP not reviewed this encounter.   Abran Cantor, NP 06/10/22 1222

## 2022-09-02 ENCOUNTER — Encounter (INDEPENDENT_AMBULATORY_CARE_PROVIDER_SITE_OTHER): Payer: Self-pay | Admitting: Gastroenterology

## 2022-10-21 ENCOUNTER — Telehealth: Payer: 59 | Admitting: Physician Assistant

## 2022-10-21 DIAGNOSIS — U071 COVID-19: Secondary | ICD-10-CM

## 2022-10-21 DIAGNOSIS — J019 Acute sinusitis, unspecified: Secondary | ICD-10-CM

## 2022-10-21 DIAGNOSIS — B9689 Other specified bacterial agents as the cause of diseases classified elsewhere: Secondary | ICD-10-CM

## 2022-10-21 MED ORDER — AMOXICILLIN-POT CLAVULANATE 875-125 MG PO TABS: 1.0000 | ORAL_TABLET | Freq: Two times a day (BID) | ORAL | 0 refills | Status: DC

## 2022-10-21 NOTE — Progress Notes (Signed)

## 2022-10-22 DIAGNOSIS — C541 Malignant neoplasm of endometrium: Secondary | ICD-10-CM

## 2022-10-22 HISTORY — DX: Malignant neoplasm of endometrium: C54.1

## 2022-12-13 ENCOUNTER — Ambulatory Visit: Payer: 59

## 2022-12-13 ENCOUNTER — Ambulatory Visit: Payer: 59 | Admitting: Podiatry

## 2022-12-13 DIAGNOSIS — G5761 Lesion of plantar nerve, right lower limb: Secondary | ICD-10-CM

## 2022-12-13 DIAGNOSIS — M778 Other enthesopathies, not elsewhere classified: Secondary | ICD-10-CM

## 2022-12-13 DIAGNOSIS — S93129A Dislocation of metatarsophalangeal joint of unspecified toe(s), initial encounter: Secondary | ICD-10-CM

## 2022-12-13 MED ORDER — METHYLPREDNISOLONE 4 MG PO TBPK: ORAL_TABLET | ORAL | 0 refills | Status: DC

## 2022-12-16 NOTE — Progress Notes (Signed)
Subjective:  Patient ID: Samantha Wang, female    DOB: [birth year],  MRN: [id number]  Chief Complaint  Patient presents with   Neuroma    (np) Second opinion of right foot neuroma and plantar plate tear    52 y.o. female presents with the above complaint. History confirmed with patient.  This started back in December without any known trauma.  She was seen at [facility] urgent care.  Referred to a foot and ankle specialist there.  They have completed x-rays and an MRI and diagnosed her with a tear of the collateral ligament of the third toe, they recommended that she be weightbearing in regular shoes try to rest and utilize a Budin splint.  So far this has not helped.  Objective:  Physical Exam: warm, good capillary refill, no trophic changes or ulcerative lesions, normal DP and PT pulses, normal sensory exam, and she has tenderness on the medial portion of the third MTPJ, worse with abduction of the toe, negative Lachman test, slightly mild tenderness in the second interspace but there are no neuritic shooting pain symptoms or numbness of the toe with pressure   I reviewed both plain film radiographs and an MRI from her orthopedics doctor, plain film x-ray did not show significant deformity or deviation of the toe there is no fracture, MRI does clearly show a medial collateral ligament rupture of the third MTPJ. Assessment:   1. Dislocation of metatarsophalangeal joint of toe, initial encounter   2. Capsulitis of foot   3. Morton's neuroma of right foot      Plan:  Patient was evaluated and treated and all questions answered.  I reviewed the imaging results from her orthopedist as well as her treatment plan thus far.  I also recommended nonoperative treatment at this point.  I did recommend that she increase her mobilization to a cam walker boot and this was dispensed.  Darco plantarflexion adduction splint was also applied.  She will wear this at home and sleep in it if tolerated.   I recommended methylprednisolone corticosteroid taper for inflammation relief.  I also recommended following up with an ultrasound to evaluate further if there is in fact a neuroma, clinically I am less suspicious of this than the MCL tear.  I will follow-up with her after this.  Return in about 1 month (around 01/11/2023) for follow up on ligament tear 3rd toe.

## 2023-01-15 ENCOUNTER — Ambulatory Visit: Payer: 59 | Admitting: Podiatry

## 2023-01-15 DIAGNOSIS — M7751 Other enthesopathy of right foot: Secondary | ICD-10-CM

## 2023-01-15 DIAGNOSIS — G5761 Lesion of plantar nerve, right lower limb: Secondary | ICD-10-CM | POA: Diagnosis not present

## 2023-01-15 NOTE — Progress Notes (Signed)
Subjective:  Patient ID: Samantha Wang, female    DOB: May 30, 1971,  MRN: [id number]  Chief Complaint  Patient presents with   Tendonitis    follow up on ligament tear 3rd toe 1 month follow up    52 y.o. female presents with the above complaint. History confirmed with patient.  This started back in December without any known trauma.  She was seen at [facility] urgent care.  Referred to a foot and ankle specialist there.  They have completed x-rays and an MRI and diagnosed her with a tear of the collateral ligament of the third toe, they recommended that she be weightbearing in regular shoes try to rest and utilize a Budin splint.  So far this has not helped.  Interval history: She returns for follow-up of the issue says it feels approximately about the same.  Felt better while she was on the week of prednisone, she feels okay in the boot, still not able to tolerate being barefoot.  Objective:  Physical Exam: warm, good capillary refill, no trophic changes or ulcerative lesions, normal DP and PT pulses, normal sensory exam, and tenderness medial third MTP is improving especially dorsally, she does still have tenderness in the plantar sulcus in the interdigital space and with compression of the intermetatarsal space  I reviewed both plain film radiographs and an MRI from her orthopedics doctor, plain film x-ray did not show significant deformity or deviation of the toe there is no fracture, MRI does clearly show a medial collateral ligament rupture of the third MTPJ.  Ultrasound from [facility] tried imaging showed a small neuroma and intermetatarsal bursa in the second interspace Assessment:   1. Morton's neuroma of right foot   2. Bursitis of intermetatarsal bursa of right foot      Plan:  Patient was evaluated and treated and all questions answered.  We reviewed the results of her ultrasound we discussed that she does have intermetatarsal bursitis and a small neuroma which is  consistent with her current clinical symptoms.  We discussed treatment of this and I recommended a corticosteroid injection.  Following sterile prep with alcohol, 4 mg of dexamethasone and 0.5 cc of 0.5% Marcaine plain was injected from a dorsal approach into the second interspace and plantar soft tissue around the digital nerve.  She tolerated this well.  We also discussed offloading with an orthotic long-term for this.  I will see her back in 6 weeks for follow-up, she may begin to transition away from the cam boot back to regular shoe gear and activity as tolerated.  I recommended she continue to avoid being barefoot for now.  Return in about 6 weeks (around 02/26/2023) for follow up neuroma / bursitis injection .

## 2023-01-16 ENCOUNTER — Encounter (HOSPITAL_COMMUNITY): Payer: Self-pay | Admitting: Hematology

## 2023-02-26 ENCOUNTER — Ambulatory Visit: Payer: 59 | Admitting: Podiatry

## 2023-02-26 DIAGNOSIS — M7751 Other enthesopathy of right foot: Secondary | ICD-10-CM

## 2023-02-26 DIAGNOSIS — G5761 Lesion of plantar nerve, right lower limb: Secondary | ICD-10-CM | POA: Diagnosis not present

## 2023-02-26 NOTE — Patient Instructions (Signed)
  These exercises may help you when beginning to rehabilitate your injury. They may resolve your symptoms with or without further involvement from your physician, physical therapist or athletic trainer. While completing these exercises, remember:  Muscles can gain both the endurance and the strength needed for everyday activities through controlled exercises. Complete these exercises as instructed by your physician, physical therapist or athletic trainer. Progress the resistance and repetitions only as guided.  STRENGTH - Towel Curls Sit in a chair positioned on a non-carpeted surface. Place your foot on a towel, keeping your heel on the floor. Pull the towel toward your heel by only curling your toes. Keep your heel on the floor. Repeat 3 times. Complete this exercise 2 times per day.  STRENGTH - Ankle Inversion Secure one end of a rubber exercise band/tubing to a fixed object (table, pole). Loop the other end around your foot just before your toes. Place your fists between your knees. This will focus your strengthening at your ankle. Slowly, pull your big toe up and in, making sure the band/tubing is positioned to resist the entire motion. Hold this position for 10 seconds. Have your muscles resist the band/tubing as it slowly pulls your foot back to the starting position. Repeat 3 times. Complete this exercises 2 times per day.  Document Released: 10/08/2005 Document Revised: 12/31/2011 Document Reviewed: 01/20/2009 [facility] Patient Information 2014 [place], [place].

## 2023-03-03 NOTE — Progress Notes (Signed)
Subjective:  Patient ID: Samantha Wang, female    DOB: 01/14/1971,  MRN: [id number]  Chief Complaint  Patient presents with   Neuroma    6 week follow up neuroma / bursitis injection right - overall she is feeling better, but not quite 100%    52 y.o. female presents with the above complaint. History confirmed with patient.  This started back in December without any known trauma.  She was seen at [facility] urgent care.  Referred to a foot and ankle specialist there.  They have completed x-rays and an MRI and diagnosed her with a tear of the collateral ligament of the third toe, they recommended that she be weightbearing in regular shoes try to rest and utilize a Budin splint.  So far this has not helped.  Interval history: Feels like it is at least 50% better after the injection  Objective:  Physical Exam: warm, good capillary refill, no trophic changes or ulcerative lesions, normal DP and PT pulses, normal sensory exam, and tenderness medial third MTP is improving especially dorsally, she does still have tenderness in the plantar sulcus in the interdigital space and with compression of the intermetatarsal space  I reviewed both plain film radiographs and an MRI from her orthopedics doctor, plain film x-ray did not show significant deformity or deviation of the toe there is no fracture, MRI does clearly show a medial collateral ligament rupture of the third MTPJ.  Ultrasound from [facility] tried imaging showed a small neuroma and intermetatarsal bursa in the second interspace Assessment:   1. Morton's neuroma of right foot   2. Bursitis of intermetatarsal bursa of right foot       Plan:  Patient was evaluated and treated and all questions answered.  Has had quite a bit of improvement after the injection, can gradually begin to increase her activity level and exercise in regular shoe gear.  Long-term we discussed supportive shoes and additional support such as orthoses.   Return to follow-up with me as needed if it returns or worsens.  Return if symptoms worsen or fail to improve.

## 2023-03-26 ENCOUNTER — Encounter: Payer: Self-pay | Admitting: Family Medicine

## 2023-03-26 ENCOUNTER — Ambulatory Visit: Payer: 59 | Admitting: Family Medicine

## 2023-03-26 VITALS — Ht 62.0 in | Wt 152.0 lb

## 2023-03-26 DIAGNOSIS — G43829 Menstrual migraine, not intractable, without status migrainosus: Secondary | ICD-10-CM | POA: Diagnosis not present

## 2023-03-26 DIAGNOSIS — D508 Other iron deficiency anemias: Secondary | ICD-10-CM

## 2023-03-26 DIAGNOSIS — F419 Anxiety disorder, unspecified: Secondary | ICD-10-CM | POA: Diagnosis not present

## 2023-03-26 DIAGNOSIS — R7303 Prediabetes: Secondary | ICD-10-CM | POA: Diagnosis not present

## 2023-03-26 DIAGNOSIS — Z13220 Encounter for screening for lipoid disorders: Secondary | ICD-10-CM

## 2023-03-26 MED ORDER — SUMATRIPTAN SUCCINATE 100 MG PO TABS: ORAL_TABLET | ORAL | 5 refills | Status: AC

## 2023-03-26 MED ORDER — CLONAZEPAM 0.5 MG PO TABS: ORAL_TABLET | ORAL | 3 refills | Status: AC

## 2023-03-26 NOTE — Patient Instructions (Signed)
Labs when you like.   Medications refilled.  Take care  Dr. Adriana Simas

## 2023-03-26 NOTE — Assessment & Plan Note (Signed)
Continue as needed Klonopin.  Stable.  Refilled today.

## 2023-03-26 NOTE — Assessment & Plan Note (Signed)
Doing well.  Imitrex as needed.  Refilled today.

## 2023-03-26 NOTE — Progress Notes (Signed)
Subjective:  Patient ID: Samantha Wang, female    DOB: [birth year]  Age: 52 y.o. MRN: [id number]  CC: Chief Complaint  Patient presents with   Anxiety    Medication refill   Headache    Med refill    HPI:  52 year old female presents for evaluation of the above.  Patient states that overall she is doing well.  Anxiety is stable on as needed Klonopin.  Uses infrequently.  Patient states that she is not having many migraines at all.  She does like to have the Imitrex on hand.  Requesting refill.  No other complaints or concerns at this time.  Patient Active Problem List   Diagnosis Date Noted   Prediabetes 08/15/2021   Menstrual migraine 08/15/2021   Iron deficiency anemia 02/01/2021   Depression 04/18/2015   Insomnia 06/23/2014   Anxiety 06/10/2013   GERD (gastroesophageal reflux disease) 10/06/2012   PUD (peptic ulcer disease) 10/06/2012   IBS (irritable bowel syndrome) 10/06/2012    Social Hx   Social History   Socioeconomic History   Marital status: Married    Spouse name: Not on file   Number of children: Not on file   Years of education: Not on file   Highest education level: Bachelor's degree (e.g., BA, AB, BS)  Occupational History   Not on file  Tobacco Use   Smoking status: Never   Smokeless tobacco: Never  Vaping Use   Vaping Use: Never used  Substance and Sexual Activity   Alcohol use: Not Currently   Drug use: No   Sexual activity: Yes    Partners: Male    Birth control/protection: Other-see comments    Comment: spouse-vasectomy   Other Topics Concern   Not on file  Social History Narrative   Not on file   Social Determinants of Health   Financial Resource Strain: Low Risk  (03/25/2023)   Overall Financial Resource Strain (CARDIA)    Difficulty of Paying Living Expenses: Not hard at all  Food Insecurity: No Food Insecurity (03/25/2023)   Hunger Vital Sign    Worried About Running Out of Food in the Last Year: Never true    Ran Out of  Food in the Last Year: Never true  Transportation Needs: No Transportation Needs (03/25/2023)   PRAPARE - Administrator, Civil Service (Medical): No    Lack of Transportation (Non-Medical): No  Physical Activity: Sufficiently Active (03/25/2023)   Exercise Vital Sign    Days of Exercise per Week: 5 days    Minutes of Exercise per Session: 40 min  Stress: No Stress Concern Present (03/25/2023)   [employer] of Occupational Health - Occupational Stress Questionnaire    Feeling of Stress : Only a little  Social Connections: Moderately Integrated (03/25/2023)   Social Connection and Isolation Panel [NHANES]    Frequency of Communication with Friends and Family: More than three times a week    Frequency of Social Gatherings with Friends and Family: Once a week    Attends Religious Services: 1 to 4 times per year    Active Member of [employer] or Organizations: No    Attends Engineer, structural: Not on file    Marital Status: Married    Review of Systems Per HPI  Objective:  Ht 5\' 2"  (1.575 m)   Wt 152 lb (68.9 kg)   BMI 27.80 kg/m      03/26/2023    9:08 AM 06/10/2022   12:04 PM 01/24/2022  3:04 PM  BP/Weight  Systolic BP  130 161  Diastolic BP  83 86  Wt. (Lbs) 152  153  BMI 27.8 kg/m2  27.98 kg/m2    Physical Exam Vitals and nursing note reviewed.  Constitutional:      General: She is not in acute distress.    Appearance: She is well-developed.  HENT:     Head: Normocephalic and atraumatic.  Eyes:     General:        Right eye: No discharge.        Left eye: No discharge.     Conjunctiva/sclera: Conjunctivae normal.  Cardiovascular:     Rate and Rhythm: Normal rate and regular rhythm.  Pulmonary:     Effort: Pulmonary effort is normal.     Breath sounds: Normal breath sounds. No wheezing, rhonchi or rales.  Neurological:     Mental Status: She is alert.  Psychiatric:        Mood and Affect: Mood normal.        Behavior: Behavior normal.    Doing well.  Imitrex  Lab Results  Component Value Date   WBC 5.4 01/02/2022   HGB 13.7 01/02/2022   HCT 41.6 01/02/2022   PLT 275 01/02/2022   GLUCOSE 91 08/15/2021   CHOL 165 07/29/2019   TRIG 48 07/29/2019   HDL 68 07/29/2019   LDLCALC 87 07/29/2019   ALT 8 08/15/2021   AST 15 08/15/2021   NA 141 08/15/2021   K 4.2 08/15/2021   CL 104 08/15/2021   CREATININE 0.70 08/15/2021   BUN 9 08/15/2021   CO2 23 08/15/2021   TSH 1.450 07/29/2019   HGBA1C 5.9 (H) 08/15/2021     Assessment & Plan:   Problem List Items Addressed This Visit       Cardiovascular and Mediastinum   Menstrual migraine    Doing well.  Imitrex as needed.  Refilled today.      Relevant Medications   SUMAtriptan (IMITREX) 100 MG tablet   clonazePAM (KLONOPIN) 0.5 MG tablet     Other   Iron deficiency anemia (Chronic)   Relevant Orders   CBC   Iron, TIBC and Ferritin Panel   Prediabetes   Relevant Orders   CMP14+EGFR   Hemoglobin A1c   Anxiety - Primary    Continue as needed Klonopin.  Stable.  Refilled today.      Other Visit Diagnoses     Screening, lipid       Relevant Orders   Lipid panel       Meds ordered this encounter  Medications   SUMAtriptan (IMITREX) 100 MG tablet    Sig: TAKE 1 TABLET BY MOUTH AS NEEDED FOR MIGRAINE. MAY REPEAT ONCE AFTER 2 HOURS    Dispense:  10 tablet    Refill:  5   clonazePAM (KLONOPIN) 0.5 MG tablet    Sig: 1/2 to 1 po BID prn anxiety    Dispense:  30 tablet    Refill:  3    Follow-up:  Return in about 1 year (around 03/25/2024).  Everlene Other DO [facility]

## 2023-03-27 LAB — IRON,TIBC AND FERRITIN PANEL
Ferritin: 45 ng/mL (ref 15–150)
Iron Saturation: 30 % (ref 15–55)
Iron: 90 ug/dL (ref 27–159)
Total Iron Binding Capacity: 297 ug/dL (ref 250–450)
UIBC: 207 ug/dL (ref 131–425)

## 2023-03-27 LAB — CMP14+EGFR
ALT: 20 IU/L (ref 0–32)
AST: 20 IU/L (ref 0–40)
Albumin/Globulin Ratio: 1.7 (ref 1.2–2.2)
Albumin: 4.7 g/dL (ref 3.8–4.9)
Alkaline Phosphatase: 69 IU/L (ref 44–121)
BUN/Creatinine Ratio: 22 (ref 9–23)
BUN: 16 mg/dL (ref 6–24)
Bilirubin Total: 0.3 mg/dL (ref 0.0–1.2)
CO2: 26 mmol/L (ref 20–29)
Calcium: 9.4 mg/dL (ref 8.7–10.2)
Chloride: 103 mmol/L (ref 96–106)
Creatinine, Ser: 0.72 mg/dL (ref 0.57–1.00)
Globulin, Total: 2.7 g/dL (ref 1.5–4.5)
Glucose: 105 mg/dL — ABNORMAL HIGH (ref 70–99)
Potassium: 4.3 mmol/L (ref 3.5–5.2)
Sodium: 142 mmol/L (ref 134–144)
Total Protein: 7.4 g/dL (ref 6.0–8.5)
eGFR: 101 mL/min/{1.73_m2} (ref >59)

## 2023-03-27 LAB — CBC
Hematocrit: 45.2 % (ref 34.0–46.6)
Hemoglobin: 14.4 g/dL (ref 11.1–15.9)
MCH: 29.2 pg (ref 26.6–33.0)
MCHC: 31.9 g/dL (ref 31.5–35.7)
MCV: 92 fL (ref 79–97)
Platelets: 281 10*3/uL (ref 150–450)
RBC: 4.93 x10E6/uL (ref 3.77–5.28)
RDW: 12.7 % (ref 11.7–15.4)
WBC: 7.8 10*3/uL (ref 3.4–10.8)

## 2023-03-27 LAB — LIPID PANEL
Chol/HDL Ratio: 2.5 ratio (ref 0.0–4.4)
Cholesterol, Total: 175 mg/dL (ref 100–199)
HDL: 70 mg/dL (ref >39)
LDL Chol Calc (NIH): 88 mg/dL (ref 0–99)
Triglycerides: 91 mg/dL (ref 0–149)
VLDL Cholesterol Cal: 17 mg/dL (ref 5–40)

## 2023-03-27 LAB — HEMOGLOBIN A1C
Est. average glucose Bld gHb Est-mCnc: 123 mg/dL
Hgb A1c MFr Bld: 5.9 % — ABNORMAL HIGH (ref 4.8–5.6)

## 2023-04-26 ENCOUNTER — Other Ambulatory Visit: Payer: Self-pay | Admitting: Obstetrics and Gynecology

## 2023-04-26 DIAGNOSIS — Z1231 Encounter for screening mammogram for malignant neoplasm of breast: Secondary | ICD-10-CM

## 2023-05-11 ENCOUNTER — Telehealth: Payer: 59 | Admitting: Nurse Practitioner

## 2023-05-11 DIAGNOSIS — J0100 Acute maxillary sinusitis, unspecified: Secondary | ICD-10-CM

## 2023-05-11 MED ORDER — AMOXICILLIN-POT CLAVULANATE 875-125 MG PO TABS: 1.0000 | ORAL_TABLET | Freq: Two times a day (BID) | ORAL | 0 refills | Status: DC

## 2023-05-11 NOTE — Progress Notes (Signed)
E-Visit for Sinus Problems  We are sorry that you are not feeling well.  Here is how we plan to help!  Based on what you have shared with me it looks like you have sinusitis.  Sinusitis is inflammation and infection in the sinus cavities of the head.  Based on your presentation I believe you most likely have Acute Bacterial Sinusitis.  This is an infection caused by bacteria and is treated with antibiotics. I have prescribed Augmentin 875mg/125mg one tablet twice daily with food, for 7 days. You may use an oral decongestant such as Mucinex D or if you have glaucoma or high blood pressure use plain Mucinex. Saline nasal spray help and can safely be used as often as needed for congestion.  If you develop worsening sinus pain, fever or notice severe headache and vision changes, or if symptoms are not better after completion of antibiotic, please schedule an appointment with a health care provider.    Sinus infections are not as easily transmitted as other respiratory infection, however we still recommend that you avoid close contact with loved ones, especially the very young and elderly.  Remember to wash your hands thoroughly throughout the day as this is the number one way to prevent the spread of infection!  Home Care: Only take medications as instructed by your medical team. Complete the entire course of an antibiotic. Do not take these medications with alcohol. A steam or ultrasonic humidifier can help congestion.  You can place a towel over your head and breathe in the steam from hot water coming from a faucet. Avoid close contacts especially the very young and the elderly. Cover your mouth when you cough or sneeze. Always remember to wash your hands.  Get Help Right Away If: You develop worsening fever or sinus pain. You develop a severe head ache or visual changes. Your symptoms persist after you have completed your treatment plan.  Make sure you Understand these instructions. Will watch  your condition. Will get help right away if you are not doing well or get worse.  Thank you for choosing an e-visit.  Your e-visit answers were reviewed by a board certified advanced clinical practitioner to complete your personal care plan. Depending upon the condition, your plan could have included both over the counter or prescription medications.  Please review your pharmacy choice. Make sure the pharmacy is open so you can pick up prescription now. If there is a problem, you may contact your provider through MyChart messaging and have the prescription routed to another pharmacy.  Your safety is important to us. If you have drug allergies check your prescription carefully.   For the next 24 hours you can use MyChart to ask questions about today's visit, request a non-urgent call back, or ask for a work or school excuse. You will get an email in the next two days asking about your experience. I hope that your e-visit has been valuable and will speed your recovery.  Mary-Margaret Martin, FNP   5-10 minutes spent reviewing and documenting in chart.  

## 2023-05-11 NOTE — Addendum Note (Signed)
Addended by: Bennie Pierini on: 05/11/2023 02:23 PM   Modules accepted: Orders, Level of Service

## 2023-05-15 ENCOUNTER — Ambulatory Visit
Admission: RE | Admit: 2023-05-15 | Discharge: 2023-05-15 | Disposition: A | Discharge status: Home / Self Care | Payer: 59 | Source: Ambulatory Visit | Attending: Obstetrics and Gynecology | Admitting: Obstetrics and Gynecology

## 2023-05-15 DIAGNOSIS — Z1231 Encounter for screening mammogram for malignant neoplasm of breast: Secondary | ICD-10-CM

## 2023-05-30 ENCOUNTER — Emergency Department (HOSPITAL_COMMUNITY): Payer: 59

## 2023-05-30 ENCOUNTER — Other Ambulatory Visit: Payer: Self-pay

## 2023-05-30 ENCOUNTER — Encounter (HOSPITAL_COMMUNITY): Payer: Self-pay | Admitting: Emergency Medicine

## 2023-05-30 ENCOUNTER — Emergency Department (HOSPITAL_COMMUNITY)
Admission: EM | Admit: 2023-05-30 | Discharge: 2023-05-31 | Disposition: A | Discharge status: Home / Self Care | Payer: 59

## 2023-05-30 DIAGNOSIS — S301XXA Contusion of abdominal wall, initial encounter: Secondary | ICD-10-CM | POA: Insufficient documentation

## 2023-05-30 DIAGNOSIS — W19XXXA Unspecified fall, initial encounter: Secondary | ICD-10-CM

## 2023-05-30 DIAGNOSIS — S20222A Contusion of left back wall of thorax, initial encounter: Secondary | ICD-10-CM | POA: Diagnosis not present

## 2023-05-30 DIAGNOSIS — W109XXA Fall (on) (from) unspecified stairs and steps, initial encounter: Secondary | ICD-10-CM | POA: Diagnosis not present

## 2023-05-30 DIAGNOSIS — R109 Unspecified abdominal pain: Secondary | ICD-10-CM | POA: Diagnosis present

## 2023-05-30 DIAGNOSIS — H9312 Tinnitus, left ear: Secondary | ICD-10-CM | POA: Diagnosis not present

## 2023-05-30 LAB — URINALYSIS, ROUTINE W REFLEX MICROSCOPIC
Bacteria, UA: NONE SEEN
Bilirubin Urine: NEGATIVE
Glucose, UA: NEGATIVE mg/dL
Hgb urine dipstick: NEGATIVE
Ketones, ur: NEGATIVE mg/dL
Nitrite: NEGATIVE
Protein, ur: NEGATIVE mg/dL
Specific Gravity, Urine: 1.01 (ref 1.005–1.030)
pH: 7 (ref 5.0–8.0)

## 2023-05-30 NOTE — ED Triage Notes (Addendum)
Pt states she fell down her back steps. Pt feels like she "jarred everything in her back" and that her ears immediately started ringing which is the main reason she states she is here. Denies hitting head or LOC.

## 2023-05-30 NOTE — Discharge Instructions (Signed)
Tylenol or ibuprofenfor discomfort  

## 2023-05-30 NOTE — ED Notes (Signed)
Pt ambulatory to CT scanner at this time

## 2023-05-30 NOTE — ED Notes (Signed)
Pt ambulatory to CT scanner at this time for head CT

## 2023-05-30 NOTE — ED Provider Notes (Signed)
[place] [facility]ERGENCY DEPARTMENT AT [facility] Provider Note   CSN: [id number] Arrival date & time: 05/30/23  2012     History  Chief Complaint  Patient presents with   Samantha Wang is a 52 y.o. female.  Patient reports that she fell down 4 steps.  Patient reports that she hit on her left side left flank area patient reports that she is having pain in her left side.  Patient does not think that she hit her head patient reports about 15 minutes after the fall she noticed some ringing in her left ear.  Patient denies any nausea or vomiting she denies any dizziness patient denies any bruising to her head.  The history is provided by the patient. No language interpreter was used.  Fall This is a new problem. The current episode started 1 to 2 hours ago. The problem occurs constantly. The problem has been gradually worsening. Pertinent negatives include no chest pain. Nothing aggravates the symptoms. Nothing relieves the symptoms. She has tried nothing for the symptoms. The treatment provided no relief.       Home Medications Prior to Admission medications   Medication Sig Start Date End Date Taking? Authorizing Provider  acetaminophen (TYLENOL) 500 MG tablet Take by mouth.    [provider]  amoxicillin-clavulanate (AUGMENTIN) 875-125 MG tablet Take 1 tablet by mouth 2 (two) times daily. 05/11/23   Daphine Deutscher Mary-Margaret, FNP  clonazePAM (KLONOPIN) 0.5 MG tablet 1/2 to 1 po BID prn anxiety 03/26/23   Cook, Jayce G, DO  fluticasone (FLONASE) 50 MCG/ACT nasal spray USE TWO SPRAY(S) IN EACH NOSTRIL ONCE DAILY AS NEEDED FOR CONGESTION --  **NEEDS  OFFICE  VISIT** 09/05/15   Merlyn Albert, MD  loratadine (CLARITIN) 10 MG tablet Take 1 tablet by mouth daily.    [provider]  methocarbamol (ROBAXIN) 500 MG tablet Take 500 mg by mouth every 6 (six) hours as needed. 07/27/22   [provider]  SUMAtriptan (IMITREX) 100 MG tablet TAKE 1 TABLET  BY MOUTH AS NEEDED FOR MIGRAINE. MAY REPEAT ONCE AFTER 2 HOURS 03/26/23   Tommie Sams, DO      Allergies    Patient has no known allergies.    Review of Systems   Review of Systems  Cardiovascular:  Negative for chest pain.  All other systems reviewed and are negative.   Physical Exam Updated Vital Signs BP (!) 159/97   Pulse 78   Temp 98.2 F (36.8 C) (Oral)   Resp 16   Ht 5\' 2"  (1.575 m)   Wt 68 kg   SpO2 100%   BMI 27.44 kg/m  Physical Exam Vitals and nursing note reviewed.  Constitutional:      Appearance: She is well-developed.  HENT:     Head: Normocephalic.     Right Ear: Tympanic membrane normal.     Left Ear: Tympanic membrane normal.     Nose: Nose normal.     Mouth/Throat:     Mouth: Mucous membranes are moist.  Eyes:     Pupils: Pupils are equal, round, and reactive to light.  Cardiovascular:     Rate and Rhythm: Normal rate.  Pulmonary:     Effort: Pulmonary effort is normal.  Abdominal:     General: There is no distension.  Musculoskeletal:        General: Normal range of motion.     Cervical back: Normal range of motion.  Comments: Bruised left flank area, tender to palpation   Skin:    General: Skin is warm.  Neurological:     General: No focal deficit present.     Mental Status: She is alert and oriented to person, place, and time.  Psychiatric:        Mood and Affect: Mood normal.     ED Results / Procedures / Treatments   Labs (all labs ordered are listed, but only abnormal results are displayed) Labs Reviewed  URINALYSIS, ROUTINE W REFLEX MICROSCOPIC - Abnormal; Notable for the following components:      Result Value   Color, Urine STRAW (*)    Leukocytes,Ua TRACE (*)    All other components within normal limits    EKG None  Radiology CT L-SPINE NO CHARGE  Result Date: 05/30/2023 CLINICAL DATA:  Fall down stairs. EXAM: CT LUMBAR SPINE WITHOUT CONTRAST TECHNIQUE: Multidetector CT imaging of the lumbar spine was performed  without intravenous contrast administration. Multiplanar CT image reconstructions were also generated. RADIATION DOSE REDUCTION: This exam was performed according to the departmental dose-optimization program which includes automated exposure control, adjustment of the mA and/or kV according to patient size and/or use of iterative reconstruction technique. COMPARISON:  08/20/2017, 10/26/2022. FINDINGS: Segmentation: 5 lumbar type vertebrae. Alignment: Normal. Vertebrae: No acute fracture or focal pathologic process. Paraspinal and other soft tissues: Negative. Disc levels: L1/L2: No significant spinal canal or neural foraminal stenosis. L2-L3: No significant spinal canal or neural foraminal stenosis. L3-L4: A minimal disc bulge is noted with mild facet arthropathy. No significant spinal canal or neural foraminal stenosis. L4-L5: Mild bulging of the disc annulus with facet arthropathy and ligamentum flavum thickening resulting in mild spinal canal stenosis. There is mild neural foraminal stenosis on the right and moderate neural foraminal stenosis on the left. L5-S1: There is mild disc bulge with facet arthropathy and ligamentum flavum thickening. No significant spinal canal stenosis. Mild neural foraminal stenosis is present bilaterally. IMPRESSION: 1. No acute fracture or subluxation. 2. Multilevel degenerative changes as described above. Electronically Signed   By: Thornell Sartorius M.D.   On: 05/30/2023 21:45   CT ABDOMEN PELVIS WO CONTRAST  Result Date: 05/30/2023 CLINICAL DATA:  Trauma EXAM: CT ABDOMEN AND PELVIS WITHOUT CONTRAST TECHNIQUE: Multidetector CT imaging of the abdomen and pelvis was performed following the standard protocol without IV contrast. RADIATION DOSE REDUCTION: This exam was performed according to the departmental dose-optimization program which includes automated exposure control, adjustment of the mA and/or kV according to patient size and/or use of iterative reconstruction technique.  COMPARISON:  CT virtual colonoscopy 08/20/2017 FINDINGS: Lower chest: No acute abnormality. Hepatobiliary: No focal liver abnormality is seen. No gallstones, gallbladder wall thickening, or biliary dilatation. Pancreas: Unremarkable. No pancreatic ductal dilatation or surrounding inflammatory changes. Spleen: Normal in size without focal abnormality. Adrenals/Urinary Tract: There is a left renal cyst measuring 17 mm. Otherwise, the kidneys, adrenal glands and bladder are within normal limits. Stomach/Bowel: Stomach is within normal limits. Appendix is not definitely seen. No evidence of bowel wall thickening, distention, or inflammatory changes. There are scattered colonic diverticula. There are postsurgical changes of the gastroesophageal junction. Vascular/Lymphatic: No significant vascular findings are present. No enlarged abdominal or pelvic lymph nodes. Reproductive: Uterus and bilateral adnexa are unremarkable. Other: No abdominal wall hernia or abnormality. No abdominopelvic ascites. Musculoskeletal: No acute or significant osseous findings. IMPRESSION: 1. No acute localizing process in the abdomen or pelvis. 2. Colonic diverticulosis. 3. Left Bosniak I benign renal cyst measuring 1.7  cm. No follow-up imaging is recommended. JACR 2018 Feb; 264-273, Management of the Incidental Renal Mass on CT, [employer] 2021; 814-848, Bosniak Classification of Cystic Renal Masses, Version 2019. Electronically Signed   By: Darliss Cheney M.D.   On: 05/30/2023 21:40    Procedures Procedures    Medications Ordered in ED Medications - No data to display  ED Course/ Medical Decision Making/ A&P                                 Medical Decision Making Patient complains of a ringing in her ear after falling down 4 steps.  Patient reports the steps were slick and she slipped and fell patient reports she hit her left flank.  Amount and/or Complexity of Data Reviewed Independent Historian: spouse    Details:  Patient is here with her husband he reports he did not see her fall but was in the yard when she fell Labs: ordered.    Details: UA is negative Radiology: ordered.    Details: CT head and CT CT cervical spine are negative CT abdomen to rule out kidney injury is negative recons LS spine is negative.  Risk Risk Details: Counseled on contusion of flank.  Patient is advised that she could have a mild concussion.  Patient is given information on concussions and contusions.  Patient is advised to return to the emergency department if symptoms worsen or change.  Patient is's Tylenol or ibuprofen for discomfort.           Final Clinical Impression(s) / ED Diagnoses Final diagnoses:  Fall, initial encounter  Contusion of left side of back, initial encounter    Rx / DC Orders ED Discharge Orders     None      An After Visit Summary was printed and given to the patient.    Elson Areas, PA-C 05/30/23 2350    Coral Spikes, DO 05/31/23 1501

## 2023-06-11 ENCOUNTER — Ambulatory Visit: Payer: 59 | Admitting: Family Medicine

## 2023-06-11 VITALS — BP 126/79 | HR 67 | Wt 156.2 lb

## 2023-06-11 DIAGNOSIS — S060X0D Concussion without loss of consciousness, subsequent encounter: Secondary | ICD-10-CM | POA: Diagnosis not present

## 2023-06-11 DIAGNOSIS — S060XAA Concussion with loss of consciousness status unknown, initial encounter: Secondary | ICD-10-CM | POA: Insufficient documentation

## 2023-06-11 NOTE — Progress Notes (Signed)
Subjective:  Patient ID: Samantha Wang, female    DOB: August 09, 1971  Age: 52 y.o. MRN: [id number]  CC: ER Follow up   HPI:   52 year old female presents for follow up.   Recent fall. Hit her left flank per the ER. No reported head injury. She did not suffer a head injury but did had tinnitus. CT head and cervical spine were obtained and were normal. Patient advised that she likely had a mild concussion. Patient discharged home in stable condition.  Presents today for follow up. Continues to have intermittent tinnitus and some dizziness. Bruising on her bottom is improving.  Patient Active Problem List   Diagnosis Date Noted   Concussion 06/11/2023   Prediabetes 08/15/2021   Menstrual migraine 08/15/2021   Iron deficiency anemia 02/01/2021   Depression 04/18/2015   Insomnia 06/23/2014   Anxiety 06/10/2013   GERD (gastroesophageal reflux disease) 10/06/2012   PUD (peptic ulcer disease) 10/06/2012   IBS (irritable bowel syndrome) 10/06/2012    Social Hx   Social History   Socioeconomic History   Marital status: Married    Spouse name: Not on file   Number of children: Not on file   Years of education: Not on file   Highest education level: Bachelor's degree (e.g., BA, AB, BS)  Occupational History   Not on file  Tobacco Use   Smoking status: Never   Smokeless tobacco: Never  Vaping Use   Vaping status: Never Used  Substance and Sexual Activity   Alcohol use: Not Currently   Drug use: No   Sexual activity: Yes    Partners: Male    Birth control/protection: Other-see comments    Comment: spouse-vasectomy   Other Topics Concern   Not on file  Social History Narrative   Not on file   Social Determinants of Health   Financial Resource Strain: Low Risk  (03/25/2023)   Overall Financial Resource Strain (CARDIA)    Difficulty of Paying Living Expenses: Not hard at all  Food Insecurity: No Food Insecurity (03/25/2023)   Hunger Vital Sign    Worried About Running Out  of Food in the Last Year: Never true    Ran Out of Food in the Last Year: Never true  Transportation Needs: No Transportation Needs (03/25/2023)   PRAPARE - Administrator, Civil Service (Medical): No    Lack of Transportation (Non-Medical): No  Physical Activity: Sufficiently Active (03/25/2023)   Exercise Vital Sign    Days of Exercise per Week: 5 days    Minutes of Exercise per Session: 40 min  Stress: No Stress Concern Present (03/25/2023)   [employer] of Occupational Health - Occupational Stress Questionnaire    Feeling of Stress : Only a little  Social Connections: Moderately Integrated (03/25/2023)   Social Connection and Isolation Panel [NHANES]    Frequency of Communication with Friends and Family: More than three times a week    Frequency of Social Gatherings with Friends and Family: Once a week    Attends Religious Services: 1 to 4 times per year    Active Member of [employer] or Organizations: No    Attends Engineer, structural: Not on file    Marital Status: Married    Review of Systems Per HPI  Objective:  BP 126/79   Pulse 67   Wt 156 lb 3.2 oz (70.9 kg)   SpO2 99%   BMI 28.57 kg/m      06/11/2023   11:04  AM 05/31/2023   12:01 AM 05/30/2023    8:26 PM  BP/Weight  Systolic BP 126 117 159  Diastolic BP 79 81 97  Wt. (Lbs) 156.2  150  BMI 28.57 kg/m2  27.44 kg/m2    Physical Exam Vitals and nursing note reviewed.  Constitutional:      General: She is not in acute distress.    Appearance: Normal appearance.  HENT:     Head: Normocephalic and atraumatic.     Right Ear: Tympanic membrane normal.     Left Ear: Tympanic membrane normal.  Eyes:     General:        Right eye: No discharge.        Left eye: No discharge.     Conjunctiva/sclera: Conjunctivae normal.  Cardiovascular:     Rate and Rhythm: Normal rate and regular rhythm.  Pulmonary:     Effort: Pulmonary effort is normal.     Breath sounds: Normal breath sounds.  Neurological:      General: No focal deficit present.     Mental Status: She is alert.  Psychiatric:        Mood and Affect: Mood normal.        Behavior: Behavior normal.     Lab Results  Component Value Date   WBC 7.8 03/26/2023   HGB 14.4 03/26/2023   HCT 45.2 03/26/2023   PLT 281 03/26/2023   GLUCOSE 105 (H) 03/26/2023   CHOL 175 03/26/2023   TRIG 91 03/26/2023   HDL 70 03/26/2023   LDLCALC 88 03/26/2023   ALT 20 03/26/2023   AST 20 03/26/2023   NA 142 03/26/2023   K 4.3 03/26/2023   CL 103 03/26/2023   CREATININE 0.72 03/26/2023   BUN 16 03/26/2023   CO2 26 03/26/2023   TSH 1.450 07/29/2019   HGBA1C 5.9 (H) 03/26/2023     Assessment & Plan:   Problem List Items Addressed This Visit       Other   Concussion - Primary    Stable. Advised continued supportive care. Limit screen time.       Everlene Other DO [facility]

## 2023-06-11 NOTE — Assessment & Plan Note (Signed)
Stable. Advised continued supportive care. Limit screen time.

## 2023-06-18 NOTE — Progress Notes (Signed)
52 y.o. G7P2002 Married Caucasian female here for annual exam.    Last full period October, 2023.  Had spotting in August.  Does have random light spotting.  Some post coital spotting, which is inconsistent.   No partner change.   Some hot flashes.  Sleep varies.   PCP:  Everlene Other, DO   Patient's last menstrual period was 05/23/2023 (approximate).           Sexually active: Yes.    The current method of family planning is vasectomy.    Exercising: Yes.     walking Smoker:  no  Health Maintenance: Pap:  11/30/21 neg: HR HPV neg, 03/13/18 neg History of abnormal Pap:  no MMG:  05/15/23 Breast Density Cat B, BI-RADS CAT 1 neg Colonoscopy:  2024 per pt, with wake BMD:   03/11/15  Result  normal TDaP:  12/14/2009 Gardasil:   no HIV: neg in preg Hep C: 03/08/16 neg Screening Labs:  PCP   reports that she has never smoked. She has never used smokeless tobacco. She reports that she does not currently use alcohol. She reports that she does not use drugs.  Past Medical History:  Diagnosis Date   Allergy    Anemia    Anxiety    Breast nodule    left    GERD (gastroesophageal reflux disease)    H/O cesarean section    history of 2 c-sections     History of hiatal hernia    Peptic ulcer disease    PONV (postoperative nausea and vomiting)    Reflux     Past Surgical History:  Procedure Laterality Date   BIOPSY  07/19/2017   Procedure: BIOPSY;  Surgeon: Malissa Hippo, MD;  Location: [facility];  Service: Endoscopy;;  duodenum   CESAREAN SECTION  9629,5284   CESAREAN SECTION     x2   COLONOSCOPY N/A 07/19/2017   Procedure: COLONOSCOPY;  Surgeon: Malissa Hippo, MD;  Location: [facility];  Service: Endoscopy;  Laterality: N/A;   ESOPHAGOGASTRODUODENOSCOPY N/A 10/11/2015   Procedure: ESOPHAGOGASTRODUODENOSCOPY (EGD);  Surgeon: Malissa Hippo, MD;  Location: [facility];  Service: Endoscopy;  Laterality: N/A;  1230   ESOPHAGOGASTRODUODENOSCOPY N/A 06/06/2016    Procedure: ESOPHAGOGASTRODUODENOSCOPY (EGD);  Surgeon: Malissa Hippo, MD;  Location: [facility];  Service: Endoscopy;  Laterality: N/A;  255   ESOPHAGOGASTRODUODENOSCOPY N/A 07/19/2017   Procedure: ESOPHAGOGASTRODUODENOSCOPY (EGD);  Surgeon: Malissa Hippo, MD;  Location: [facility];  Service: Endoscopy;  Laterality: N/A;  8:25   PARTIAL GASTRECTOMY  2017   ulcer surgery  08/2016   abdominal   UPPER GASTROINTESTINAL ENDOSCOPY      Current Outpatient Medications  Medication Sig Dispense Refill   acetaminophen (TYLENOL) 500 MG tablet Take by mouth.     clonazePAM (KLONOPIN) 0.5 MG tablet 1/2 to 1 po BID prn anxiety 30 tablet 3   fluticasone (FLONASE) 50 MCG/ACT nasal spray USE TWO SPRAY(S) IN EACH NOSTRIL ONCE DAILY AS NEEDED FOR CONGESTION --  **NEEDS  OFFICE  VISIT** 16 g 5   loratadine (CLARITIN) 10 MG tablet Take 1 tablet by mouth daily.     methocarbamol (ROBAXIN) 500 MG tablet Take 500 mg by mouth every 6 (six) hours as needed.     SUMAtriptan (IMITREX) 100 MG tablet TAKE 1 TABLET BY MOUTH AS NEEDED FOR MIGRAINE. MAY REPEAT ONCE AFTER 2 HOURS 10 tablet 5   No current facility-administered medications for this visit.    Family  History  Problem Relation Age of Onset   Healthy Mother    Cancer Mother        brain cancer-2018   Hyperlipidemia Father    Healthy Brother    Healthy Son    Healthy Daughter    Breast cancer Maternal Grandmother     Review of Systems  All other systems reviewed and are negative.   Exam:   BP 128/86 (BP Location: Left Arm, Patient Position: Sitting, Cuff Size: Normal)   Ht 5\' 2"  (1.575 m)   Wt 156 lb (70.8 kg)   LMP 05/23/2023 (Approximate) Comment: spotting  BMI 28.53 kg/m     General appearance: alert, cooperative and appears stated age Head: normocephalic, without obvious abnormality, atraumatic Neck: no adenopathy, supple, symmetrical, trachea midline and thyroid normal to inspection and palpation Lungs: clear to auscultation  bilaterally Breasts: normal appearance, no masses or tenderness, No nipple retraction or dimpling, No nipple discharge or bleeding, No axillary adenopathy Heart: regular rate and rhythm Abdomen: soft, non-tender; no masses, no organomegaly Extremities: extremities normal, atraumatic, no cyanosis or edema Skin: skin color, texture, turgor normal. No rashes or lesions Lymph nodes: cervical, supraclavicular, and axillary nodes normal. Neurologic: grossly normal  Pelvic: External genitalia:  no lesions              No abnormal inguinal nodes palpated.              Urethra:  normal appearing urethra with no masses, tenderness or lesions              Bartholins and Skenes: normal                 Vagina: normal appearing vagina with normal color and discharge, no lesions              Cervix: no lesions              Pap taken: yes Bimanual Exam:  Uterus:  normal size, contour, position, consistency, mobility, non-tender              Adnexa: no mass, fullness, tenderness              Rectal exam: declined  Chaperone was present for exam:  Warren Lacy, CMA  Assessment:   Well woman visit with gynecologic exam. Irregular vaginal bleeding.  Menopausal symptoms.  Post coital bleeding.   Plan: Mammogram screening discussed. Self breast awareness reviewed. Pap and HR HPV collected.  Guidelines for Calcium, Vitamin D, regular exercise program including cardiovascular and weight bearing exercise. Check FSH and estradiol. If postmenopausal, will order pelvic US.  Consider menopausal symptom tx after results are back. Follow up annually and prn.   After visit summary provided.

## 2023-07-02 ENCOUNTER — Ambulatory Visit (INDEPENDENT_AMBULATORY_CARE_PROVIDER_SITE_OTHER): Payer: 59 | Admitting: Obstetrics and Gynecology

## 2023-07-02 ENCOUNTER — Other Ambulatory Visit (HOSPITAL_COMMUNITY)
Admission: RE | Admit: 2023-07-02 | Discharge: 2023-07-02 | Disposition: A | Discharge status: Home / Self Care | Payer: 59 | Source: Ambulatory Visit | Attending: Obstetrics and Gynecology | Admitting: Obstetrics and Gynecology

## 2023-07-02 ENCOUNTER — Encounter: Payer: Self-pay | Admitting: Obstetrics and Gynecology

## 2023-07-02 VITALS — BP 128/86 | Ht 62.0 in | Wt 156.0 lb

## 2023-07-02 DIAGNOSIS — Z124 Encounter for screening for malignant neoplasm of cervix: Secondary | ICD-10-CM | POA: Insufficient documentation

## 2023-07-02 DIAGNOSIS — N926 Irregular menstruation, unspecified: Secondary | ICD-10-CM | POA: Diagnosis not present

## 2023-07-02 DIAGNOSIS — Z01419 Encounter for gynecological examination (general) (routine) without abnormal findings: Secondary | ICD-10-CM

## 2023-07-02 NOTE — Patient Instructions (Signed)

## 2023-07-03 LAB — FOLLICLE STIMULATING HORMONE: FSH: 92.3 m[IU]/mL

## 2023-07-03 LAB — ESTRADIOL: Estradiol: 25 pg/mL

## 2023-07-04 LAB — CYTOLOGY - PAP
Comment: NEGATIVE
Diagnosis: NEGATIVE
High risk HPV: NEGATIVE

## 2023-07-09 ENCOUNTER — Other Ambulatory Visit: Payer: Self-pay | Admitting: *Deleted

## 2023-07-09 ENCOUNTER — Encounter: Payer: Self-pay | Admitting: *Deleted

## 2023-07-09 DIAGNOSIS — N950 Postmenopausal bleeding: Secondary | ICD-10-CM

## 2023-07-09 NOTE — Progress Notes (Signed)
Seen by patient Samantha Wang on 07/07/2023 1:56 PM MyChart message sent with imaging instructions. Order placed.

## 2023-07-25 ENCOUNTER — Ambulatory Visit: Payer: 59 | Admitting: Podiatry

## 2023-07-25 ENCOUNTER — Encounter: Payer: Self-pay | Admitting: Podiatry

## 2023-07-25 DIAGNOSIS — G5763 Lesion of plantar nerve, bilateral lower limbs: Secondary | ICD-10-CM

## 2023-07-25 DIAGNOSIS — G5761 Lesion of plantar nerve, right lower limb: Secondary | ICD-10-CM

## 2023-07-25 NOTE — Progress Notes (Signed)
Subjective:  Patient ID: Samantha Wang, female    DOB: 10-04-1971,  MRN: [id number]  Chief Complaint  Patient presents with   Neuroma    F/up right foot 3rd MPJ neuroma/capsulitis/MPJ pain. Doing well for a time. Injured back and aggravated foot doing PT. Currently on day 2 of 12 day steroid dose taper    52 y.o. female presents with the above complaint. History confirmed with patient.  She returns for follow-up it was doing quite well through June, and August she was doing PT for her back when she felt something pull or pop and the pain has returned and feels like is quite painful again but not nearly as bad as when it originally started last year  Objective:  Physical Exam: warm, good capillary refill, no trophic changes or ulcerative lesions, normal DP and PT pulses, normal sensory exam, and tenderness in second plantar interspace with radiation into the toes  plain film radiographs and an MRI from her orthopedics doctor, plain film x-ray did not show significant deformity or deviation of the toe there is no fracture, MRI does clearly show a medial collateral ligament rupture of the third MTPJ.  Ultrasound from [facility] tried imaging showed a small neuroma and intermetatarsal bursa in the second interspace Assessment:   1. Morton's neuroma of right foot       Plan:  Patient was evaluated and treated and all questions answered.   Samantha Wang has become symptomatic again.  This is bothering her more than her back at this point.  I recommended repeat injection.  Following sterile prep with alcohol from a dorsal approach the second or space was injected with 4 mg dexamethasone and 0.5 cc of 0.5% Marcaine plain.  She tolerated this well and was dressed with a Band-Aid.  Long-term we discussed offloading and care and prevention of this including use of a custom molded foot orthosis which I think she would benefit from.  She will meet with our orthotist for casting for this.  Return if  symptoms worsen or fail to improve.

## 2023-07-26 ENCOUNTER — Ambulatory Visit (HOSPITAL_COMMUNITY)
Admission: RE | Admit: 2023-07-26 | Discharge: 2023-07-26 | Disposition: A | Discharge status: Home / Self Care | Payer: 59 | Source: Ambulatory Visit | Attending: Obstetrics and Gynecology | Admitting: Obstetrics and Gynecology

## 2023-07-26 DIAGNOSIS — N950 Postmenopausal bleeding: Secondary | ICD-10-CM | POA: Diagnosis present

## 2023-08-01 ENCOUNTER — Encounter: Payer: Self-pay | Admitting: Obstetrics and Gynecology

## 2023-08-02 NOTE — Telephone Encounter (Signed)
PUS results in EPIC, routing to Dr. Edward Jolly.

## 2023-08-07 ENCOUNTER — Other Ambulatory Visit: Payer: 59

## 2023-08-12 ENCOUNTER — Other Ambulatory Visit (HOSPITAL_COMMUNITY)
Admission: RE | Admit: 2023-08-12 | Discharge: 2023-08-12 | Disposition: A | Discharge status: Home / Self Care | Payer: 59 | Source: Ambulatory Visit | Attending: Obstetrics and Gynecology | Admitting: Obstetrics and Gynecology

## 2023-08-12 ENCOUNTER — Ambulatory Visit (INDEPENDENT_AMBULATORY_CARE_PROVIDER_SITE_OTHER): Payer: 59 | Admitting: Obstetrics and Gynecology

## 2023-08-12 ENCOUNTER — Encounter: Payer: Self-pay | Admitting: Obstetrics and Gynecology

## 2023-08-12 ENCOUNTER — Other Ambulatory Visit: Payer: Self-pay

## 2023-08-12 VITALS — BP 122/70 | HR 76 | Ht 62.0 in | Wt 159.0 lb

## 2023-08-12 DIAGNOSIS — N950 Postmenopausal bleeding: Secondary | ICD-10-CM

## 2023-08-12 DIAGNOSIS — Z01812 Encounter for preprocedural laboratory examination: Secondary | ICD-10-CM | POA: Diagnosis not present

## 2023-08-12 DIAGNOSIS — Z124 Encounter for screening for malignant neoplasm of cervix: Secondary | ICD-10-CM | POA: Insufficient documentation

## 2023-08-12 DIAGNOSIS — R9389 Abnormal findings on diagnostic imaging of other specified body structures: Secondary | ICD-10-CM | POA: Insufficient documentation

## 2023-08-12 DIAGNOSIS — N930 Postcoital and contact bleeding: Secondary | ICD-10-CM

## 2023-08-12 LAB — PREGNANCY, URINE: Preg Test, Ur: NEGATIVE

## 2023-08-12 NOTE — Progress Notes (Signed)
GYNECOLOGY  VISIT   HPI: 52 y.o.   Married  Caucasian  female   G2P2002 with No LMP recorded. (Menstrual status: Irregular Periods).   here for   endometrial bx. Postcoital spotting.   Has bleeding occasionally.  LMP was Oct. 2023.  FSH 92.3, E2 25 on 07/02/23.   Pelvic US 07/26/23 - EMS 9 mm. No fibroids. Normal ovaries.   UPT negative.   GYNECOLOGIC HISTORY: No LMP recorded. (Menstrual status: Irregular Periods). Contraception:  vasectomy Menopausal hormone therapy:  n/a Last mammogram:  05/15/23 Breast Density Cat B, BI-RADS CAT 1 neg  Last pap smear:   11/30/21 neg: HR HPV neg, 03/13/18 neg         OB History     Gravida  2   Para  2   Term  2   Preterm      AB      Living  2      SAB      IAB      Ectopic      Multiple      Live Births                 Patient Active Problem List   Diagnosis Date Noted   Concussion 06/11/2023   Prediabetes 08/15/2021   Menstrual migraine 08/15/2021   Iron deficiency anemia 02/01/2021   Depression 04/18/2015   Insomnia 06/23/2014   Anxiety 06/10/2013   GERD (gastroesophageal reflux disease) 10/06/2012   PUD (peptic ulcer disease) 10/06/2012   IBS (irritable bowel syndrome) 10/06/2012    Past Medical History:  Diagnosis Date   Allergy    Anemia    Anxiety    Breast nodule    left    GERD (gastroesophageal reflux disease)    H/O cesarean section    history of 2 c-sections     History of hiatal hernia    Peptic ulcer disease    PONV (postoperative nausea and vomiting)    Reflux     Past Surgical History:  Procedure Laterality Date   BIOPSY  07/19/2017   Procedure: BIOPSY;  Surgeon: Malissa Hippo, MD;  [facility]: [facility];  Service: Endoscopy;;  duodenum   CESAREAN SECTION  1610,9604   CESAREAN SECTION     x2   COLONOSCOPY N/A 07/19/2017   Procedure: COLONOSCOPY;  Surgeon: Malissa Hippo, MD;  Location: [facility];  Service: Endoscopy;  Laterality: N/A;   ESOPHAGOGASTRODUODENOSCOPY N/A  10/11/2015   Procedure: ESOPHAGOGASTRODUODENOSCOPY (EGD);  Surgeon: Malissa Hippo, MD;  Location: [facility];  Service: Endoscopy;  Laterality: N/A;  1230   ESOPHAGOGASTRODUODENOSCOPY N/A 06/06/2016   Procedure: ESOPHAGOGASTRODUODENOSCOPY (EGD);  Surgeon: Malissa Hippo, MD;  Location: [facility];  Service: Endoscopy;  Laterality: N/A;  255   ESOPHAGOGASTRODUODENOSCOPY N/A 07/19/2017   Procedure: ESOPHAGOGASTRODUODENOSCOPY (EGD);  Surgeon: Malissa Hippo, MD;  Location: [facility];  Service: Endoscopy;  Laterality: N/A;  8:25   PARTIAL GASTRECTOMY  2017   ulcer surgery  08/2016   abdominal   UPPER GASTROINTESTINAL ENDOSCOPY      Current Outpatient Medications  Medication Sig Dispense Refill   acetaminophen (TYLENOL) 500 MG tablet Take by mouth.     clonazePAM (KLONOPIN) 0.5 MG tablet 1/2 to 1 po BID prn anxiety 30 tablet 3   fluticasone (FLONASE) 50 MCG/ACT nasal spray USE TWO SPRAY(S) IN EACH NOSTRIL ONCE DAILY AS NEEDED FOR CONGESTION --  **NEEDS  OFFICE  VISIT** 16 g 5  loratadine (CLARITIN) 10 MG tablet Take 1 tablet by mouth daily.     methocarbamol (ROBAXIN) 500 MG tablet Take 500 mg by mouth every 6 (six) hours as needed.     SUMAtriptan (IMITREX) 100 MG tablet TAKE 1 TABLET BY MOUTH AS NEEDED FOR MIGRAINE. MAY REPEAT ONCE AFTER 2 HOURS 10 tablet 5   No current facility-administered medications for this visit.     ALLERGIES: Patient has no known allergies.  Family History  Problem Relation Age of Onset   Healthy Mother    Cancer Mother        brain cancer-2018   Hyperlipidemia Father    Healthy Brother    Healthy Son    Healthy Daughter    Breast cancer Maternal Grandmother     Social History   Socioeconomic History   Marital status: Married    Spouse name: Not on file   Number of children: Not on file   Years of education: Not on file   Highest education level: Bachelor's degree (e.g., BA, AB, BS)  Occupational History   Not on file  Tobacco Use    Smoking status: Never   Smokeless tobacco: Never  Vaping Use   Vaping status: Never Used  Substance and Sexual Activity   Alcohol use: Not Currently   Drug use: No   Sexual activity: Yes    Partners: Male    Birth control/protection: Other-see comments    Comment: spouse-vasectomy   Other Topics Concern   Not on file  Social History Narrative   Not on file   Social Determinants of Health   Financial Resource Strain: Low Risk  (03/25/2023)   Overall Financial Resource Strain (CARDIA)    Difficulty of Paying Living Expenses: Not hard at all  Food Insecurity: No Food Insecurity (03/25/2023)   Hunger Vital Sign    Worried About Running Out of Food in the Last Year: Never true    Ran Out of Food in the Last Year: Never true  Transportation Needs: No Transportation Needs (03/25/2023)   PRAPARE - Administrator, Civil Service (Medical): No    Lack of Transportation (Non-Medical): No  Physical Activity: Sufficiently Active (03/25/2023)   Exercise Vital Sign    Days of Exercise per Week: 5 days    Minutes of Exercise per Session: 40 min  Stress: No Stress Concern Present (03/25/2023)   [employer] of Occupational Health - Occupational Stress Questionnaire    Feeling of Stress : Only a little  Social Connections: Moderately Integrated (03/25/2023)   Social Connection and Isolation Panel [NHANES]    Frequency of Communication with Friends and Family: More than three times a week    Frequency of Social Gatherings with Friends and Family: Once a week    Attends Religious Services: 1 to 4 times per year    Active Member of [employer] or Organizations: No    Attends Engineer, structural: Not on file    Marital Status: Married  Catering manager Violence: Not on file    Review of Systems  All other systems reviewed and are negative.   PHYSICAL EXAMINATION:    BP 122/70 (BP Location: Right Arm, Patient Position: Sitting, Cuff Size: Normal)   Pulse 76   Ht 5\' 2"  (1.575 m)    Wt 159 lb (72.1 kg)   SpO2 96%   BMI 29.08 kg/m     General appearance: alert, cooperative and appears stated age   Pelvic: External genitalia:  no lesions  Urethra:  normal appearing urethra with no masses, tenderness or lesions              Bartholins and Skenes: normal                 Vagina: normal appearing vagina with normal color and discharge, no lesions              Cervix: no lesions.  Atrophy of cervix noted.                 Bimanual Exam:  Uterus:  normal size, contour, position, consistency, mobility, non-tender              Adnexa: no mass, fullness, tenderness     EMB: Consent for procedure. Hibiclens prep.  Tenaculum to anterior cervical lip. Pipelle passed to 7.5 cm twice.   Tissue to pathology.  Minimal EBL. No complications.   Chaperone was present for exam:  Warren Lacy, CMA  ASSESSMENT  Postmenopausal bleeding.  Postcoital spotting.  Endometrial thickening on ultrasound.  Atrophy on physical exam.  Cervical cancer screening.   PLAN  Postmenopausal bleeding etiologies reviewed:  atrophy, polyps, precancer, and cancer.  Pelvic US report reviewed.  Pap and HR HPV collected. FU EMB.  Final plan to follow.  I discussed potential for sonohysterogram, dilation and curettage, hysterectomy depending on the results.    20 min  total time was spent for this patient encounter, including preparation, face-to-face counseling with the patient, coordination of care, and documentation of the encounter in addition to doing the endometrial biopsy.

## 2023-08-12 NOTE — Patient Instructions (Signed)
 Endometrial Biopsy  An endometrial biopsy is a procedure where a tissue sample is removed from the lining of the uterus. This lining is called the endometrium. The tissue sample is then sent to a lab for testing. You may have this type of biopsy to check for: Cancer. Infection. Growths called polyps. Uterine bleeding that can't be explained. Tell a health care provider about: Any allergies you have. All medicines you're taking including vitamins, herbs, eye drops, creams, and over-the-counter medicines. Any problems you or family members have had with anesthesia. Any bleeding problems you have. Any surgeries you have had. Any medical problems you have. Whether you're pregnant or may be pregnant. What are the risks? Your health care provider will talk with you about risks. These may include: Bleeding. Infection. Allergic reactions to medicines. Damage to the wall of the uterus. This is rare. What happens before the procedure? Keep track of your period. You may need to have this biopsy when you're not having your period. Ask your provider about: Changing or stopping your regular medicines. These include any diabetes medicines or blood thinners you take. Taking medicines such as aspirin and ibuprofen. These medicines can thin your blood. Do not take them unless your provider tells you to. Taking over-the-counter medicines, vitamins, herbs, and supplements. Bring a pad with you. You may need to wear one after the biopsy. Plan to have a responsible adult take you home from the hospital or clinic. You won't be allowed to drive. What happens during the procedure? A tool will be put into your vagina to hold it open. This helps your provider see the cervix. The cervix is the lowest part of the uterus. Your cervix will be cleaned with a solution that kills germs. You will be given anesthesia. This keeps you from feeling pain. It will numb your cervix. A tool called forceps will be used to  hold your cervix steady. A thin tool called a uterine sound will be put through your cervix. It will be used to: Find the length of your uterus. Find where to take the sample from. A soft tube called a catheter will be put into your uterus. The catheter will remove a tissue sample. The tube and tools will be removed. The sample will be sent to a lab for testing. The procedure may vary among providers and hospitals. What happens after the procedure? Your blood pressure, heart rate, breathing rate, and blood oxygen level will be monitored until you leave the hospital or clinic. It's up to you to get the results of your procedure. Ask your provider, or the department that is doing the procedure, when your results will be ready. This information is not intended to replace advice given to you by your health care provider. Make sure you discuss any questions you have with your health care provider. Document Revised: 12/18/2022 Document Reviewed: 12/18/2022 Elsevier Patient Education  2024 [employer].

## 2023-08-15 ENCOUNTER — Telehealth: Payer: Self-pay | Admitting: *Deleted

## 2023-08-15 ENCOUNTER — Encounter: Payer: Self-pay | Admitting: *Deleted

## 2023-08-15 ENCOUNTER — Other Ambulatory Visit: Payer: Self-pay

## 2023-08-15 DIAGNOSIS — C55 Malignant neoplasm of uterus, part unspecified: Secondary | ICD-10-CM

## 2023-08-15 LAB — SURGICAL PATHOLOGY

## 2023-08-15 NOTE — Telephone Encounter (Signed)
Called pt to see if she was able to come tomorrow at 9:45 for an appt with Dr. Pricilla Holm. She was agreeable to come in tomorrow. Address and phone number of clinic given to patient.

## 2023-08-15 NOTE — Progress Notes (Signed)
GYNECOLOGIC ONCOLOGY NEW PATIENT CONSULTATION   Patient Name: Samantha Wang  Patient Age: 52 y.o. Date of Service: 08/16/23 Referring Provider: Dr. Conley Simmonds  Primary Care Provider: Tommie Sams, DO Consulting Provider: Eugene Garnet, MD   Assessment/Plan:  Peri vs postmenopausal patient with clinical stage I low-grade endometrial cancer.  We reviewed the nature of endometrial cancer and its recommended surgical staging, including total hysterectomy, bilateral salpingo-oophorectomy, and lymph node assessment. The patient is a suitable candidate for staging via a minimally invasive approach to surgery.  We reviewed that robotic assistance would be used to complete the surgery.     We discussed that most endometrial cancer is detected early and that decisions regarding adjuvant therapy will be made based on her final pathology.   We reviewed the sentinel lymph node technique. Risks and benefits of sentinel lymph node biopsy was reviewed. We reviewed the technique and ICG dye. The patient DOES NOT have an iodine allergy or known liver dysfunction. We reviewed the false negative rate (0.4%), and that 3% of patients with metastatic disease will not have it detected by SLN biopsy in endometrial cancer. A low risk of allergic reaction to the dye, <0.2% for ICG, has been reported. We also discussed that in the case of failed mapping, which occurs 40% of the time, a bilateral or unilateral lymphadenectomy will be performed at the surgeon's discretion.   Potential benefits of sentinel nodes including a higher detection rate for metastasis due to ultrastaging and potential reduction in operative morbidity. However, there remains uncertainty as to the role for treatment of micrometastatic disease. Further, the benefit of operative morbidity associated with the SLN technique in endometrial cancer is not yet completely known. In other patient populations (e.g. the cervical cancer population) there  has been observed reductions in morbidity with SLN biopsy compared to pelvic lymphadenectomy. Lymphedema, nerve dysfunction and lymphocysts are all potential risks with the SLN technique as with complete lymphadenectomy. Additional risks to the patient include the risk of damage to an internal organ while operating in an altered view (e.g. the black and white image of the robotic fluorescence imaging mode).   We discussed the plan for a robotic assisted hysterectomy, bilateral salpingo-oophorectomy, sentinel lymph node evaluation, possible lymph node dissection, possible laparotomy. The risks of surgery were discussed in detail and she understands these to include infection; wound separation; hernia; vaginal cuff separation, injury to adjacent organs such as bowel, bladder, blood vessels, ureters and nerves; bleeding which may require blood transfusion; anesthesia risk; thromboembolic events; possible death; unforeseen complications; possible need for re-exploration; medical complications such as heart attack, stroke, pleural effusion and pneumonia; and, if full lymphadenectomy is performed the risk of lymphedema and lymphocyst. The patient will receive DVT and antibiotic prophylaxis as indicated. She voiced a clear understanding. She had the opportunity to ask questions. Perioperative instructions were reviewed with her. Prescriptions for post-op medications were sent to her pharmacy of choice.  Given her abnormal MMR IHC, we will ask pathology to do MSI testing as well as MLH1 promoter hyper methylation testing.  In light of this as well as her young age, referral placed to genetics.  In terms of her dyspareunia and intermittent postcoital spotting, discussed that this may be in the setting of vaginal atrophy.  After she heals from surgery, pending final pathology results, we can discuss starting her on some vaginal estrogen.  A copy of this note was sent to the patient's referring provider.   65 minutes  of total  time was spent for this patient encounter, including preparation, face-to-face counseling with the patient and coordination of care, and documentation of the encounter.  Eugene Garnet, MD  Division of Gynecologic Oncology  Department of Obstetrics and Gynecology  [facility]  ___________________________________________  Chief Complaint: No chief complaint on file.   History of Present Illness:  Samantha Wang is a 52 y.o. y.o. female who is seen in consultation at the request of Dr. Edward Jolly for an evaluation of endometrial cancer.  The patient presented for her annual exam in mid September.  She had spotting that she endorsed at that time in August.  Her last menstrual cycle had been in October 2023.  She also endorsed some intermittent spotting as well as postcoital spotting.  Endometrial ultrasound was performed on 10/4 and showed a uterus measuring 8.7 x 3.7 x 5.2 cm with an endometrium measuring 9 mm.  Bilateral ovaries normal in appearance without masses.  Endometrial biopsy was performed on 08/12/23 and revealed grade 1 endometrioid endometrial adenocarcinoma. MMR with loss of MLH1 and PMS2.  Today, the patient presents with her husband.  She notes that since her last menstrual period about a year ago, she has had pink on the tissue when she wipes after voiding almost daily.  She has had spotting requiring her to wear a panty liner for a day on several occasions over the last year.  She denies any pain or cramping except for several days after her recent pelvic ultrasound.  She endorses a good appetite without nausea or emesis.  She reports normal bowel function.  Has noted some urinary frequency as well as possibly not completely emptying for the last few months.  She denies any recent weight changes.  PAST MEDICAL HISTORY:  Past Medical History:  Diagnosis Date   Allergy    Anemia    Anxiety    Breast nodule    left    GERD (gastroesophageal  reflux disease)    H/O cesarean section    history of 2 c-sections     History of hiatal hernia    Peptic ulcer disease    PONV (postoperative nausea and vomiting)    Reflux      PAST SURGICAL HISTORY:  Past Surgical History:  Procedure Laterality Date   BIOPSY  07/19/2017   Procedure: BIOPSY;  Surgeon: Malissa Hippo, MD;  Location: [facility];  Service: Endoscopy;;  duodenum   CESAREAN SECTION  1610,9604   CESAREAN SECTION     x2   COLONOSCOPY N/A 07/19/2017   Procedure: COLONOSCOPY;  Surgeon: Malissa Hippo, MD;  Location: [facility];  Service: Endoscopy;  Laterality: N/A;   ESOPHAGOGASTRODUODENOSCOPY N/A 10/11/2015   Procedure: ESOPHAGOGASTRODUODENOSCOPY (EGD);  Surgeon: Malissa Hippo, MD;  Location: [facility];  Service: Endoscopy;  Laterality: N/A;  1230   ESOPHAGOGASTRODUODENOSCOPY N/A 06/06/2016   Procedure: ESOPHAGOGASTRODUODENOSCOPY (EGD);  Surgeon: Malissa Hippo, MD;  Location: [facility];  Service: Endoscopy;  Laterality: N/A;  255   ESOPHAGOGASTRODUODENOSCOPY N/A 07/19/2017   Procedure: ESOPHAGOGASTRODUODENOSCOPY (EGD);  Surgeon: Malissa Hippo, MD;  Location: [facility];  Service: Endoscopy;  Laterality: N/A;  8:25   PARTIAL GASTRECTOMY  2017   ulcer surgery  08/2016   abdominal   UPPER GASTROINTESTINAL ENDOSCOPY      OB/GYN HISTORY:  OB History  Gravida Para Term Preterm AB Living  2 2 2     2   SAB IAB Ectopic Multiple Live Births               #  Outcome Date GA Lbr Len/2nd Weight Sex Type Anes PTL Lv  2 Term 06/27/02 [redacted]w[redacted]d  7 lb (3.175 kg) F CS-Classical     1 Term 04/21/97 [redacted]w[redacted]d  7 lb (3.175 kg) M CS-Classical       No LMP recorded. (Menstrual status: Irregular Periods).  Age at menarche: 52  Age at menopause: 50 Hx of HRT: denies Hx of STDs: denies Last pap: 2024- NIML, HR HPV negative History of abnormal pap smears: no  SCREENING STUDIES:  Last mammogram: 2024  Last colonoscopy: 2024 Last bone mineral density:  2016  MEDICATIONS: Outpatient Encounter Medications as of 08/16/2023  Medication Sig   acetaminophen (TYLENOL) 500 MG tablet Take by mouth.   clonazePAM (KLONOPIN) 0.5 MG tablet 1/2 to 1 po BID prn anxiety   fluticasone (FLONASE) 50 MCG/ACT nasal spray USE TWO SPRAY(S) IN EACH NOSTRIL ONCE DAILY AS NEEDED FOR CONGESTION --  **NEEDS  OFFICE  VISIT**   loratadine (CLARITIN) 10 MG tablet Take 1 tablet by mouth daily.   methocarbamol (ROBAXIN) 500 MG tablet Take 500 mg by mouth every 6 (six) hours as needed. (Patient not taking: Reported on 08/15/2023)   SUMAtriptan (IMITREX) 100 MG tablet TAKE 1 TABLET BY MOUTH AS NEEDED FOR MIGRAINE. MAY REPEAT ONCE AFTER 2 HOURS   No facility-administered encounter medications on file as of 08/16/2023.    ALLERGIES:  No Known Allergies   FAMILY HISTORY:  Family History  Problem Relation Age of Onset   Healthy Mother    Cancer Mother        brain cancer-2018   Hyperlipidemia Father    Healthy Brother    Lung cancer Paternal Grandmother    Breast cancer Paternal Grandmother    Healthy Daughter    Healthy Son    Cancer Paternal Uncle      SOCIAL HISTORY:  Social Connections: Moderately Integrated (03/25/2023)   Social Connection and Isolation Panel [NHANES]    Frequency of Communication with Friends and Family: More than three times a week    Frequency of Social Gatherings with Friends and Family: Once a week    Attends Religious Services: 1 to 4 times per year    Active Member of [employer] or Organizations: No    Attends Engineer, structural: Not on file    Marital Status: Married    REVIEW OF SYSTEMS:  + dyspareunia, vaginal bleeding, vaginal discharge Denies appetite changes, fevers, chills, fatigue, unexplained weight changes. Denies hearing loss, neck lumps or masses, mouth sores, ringing in ears or voice changes. Denies cough or wheezing.  Denies shortness of breath. Denies chest pain or palpitations. Denies leg swelling. Denies  abdominal distention, pain, blood in stools, constipation, diarrhea, nausea, vomiting, or early satiety. Denies pain with intercourse, dysuria, frequency, hematuria or incontinence. Denies joint pain, back pain or muscle pain/cramps. Denies itching, rash, or wounds. Denies dizziness, headaches, numbness or seizures. Denies swollen lymph nodes or glands, denies easy bruising or bleeding. Denies anxiety, depression, confusion, or decreased concentration.  Physical Exam:  Vital Signs for this encounter:  Blood pressure (!) 137/95, pulse 70, temperature 97.9 F (36.6 C), temperature source Oral, resp. rate 19, height 5\' 2"  (1.575 m), weight 158 lb 6.4 oz (71.8 kg), SpO2 100%. Body mass index is 28.97 kg/m. General: Alert, oriented, no acute distress.  HEENT: Normocephalic, atraumatic. Sclera anicteric.  Chest: Clear to auscultation bilaterally. No wheezes, rhonchi, or rales. Cardiovascular: Regular rate and rhythm, no murmurs, rubs, or gallops.  Abdomen: Normoactive bowel sounds. Soft,  nondistended, nontender to palpation. No masses or hepatosplenomegaly appreciated. No palpable fluid wave.  Healed midline supraumbilical laparotomy incision. Extremities: Grossly normal range of motion. Warm, well perfused. No edema bilaterally.  Skin: No rashes or lesions.  Lymphatics: No cervical, supraclavicular, or inguinal adenopathy.  GU:  Normal external female genitalia. No lesions. No discharge or bleeding.             Bladder/urethra:  No lesions or masses, well supported bladder             Vagina: Mildly atrophic vaginal mucosa, no lesions.             Cervix: Normal appearing, no lesions.             Uterus: Small, mobile, no parametrial involvement or nodularity.             Adnexa: No masses appreciated.  Rectal: Deferred.  LABORATORY AND RADIOLOGIC DATA:  Outside medical records were reviewed to synthesize the above history, along with the history and physical obtained during the visit.    Lab Results  Component Value Date   WBC 7.8 03/26/2023   HGB 14.4 03/26/2023   HCT 45.2 03/26/2023   PLT 281 03/26/2023   GLUCOSE 105 (H) 03/26/2023   CHOL 175 03/26/2023   TRIG 91 03/26/2023   HDL 70 03/26/2023   LDLCALC 88 03/26/2023   ALT 20 03/26/2023   AST 20 03/26/2023   NA 142 03/26/2023   K 4.3 03/26/2023   CL 103 03/26/2023   CREATININE 0.72 03/26/2023   BUN 16 03/26/2023   CO2 26 03/26/2023   TSH 1.450 07/29/2019   HGBA1C 5.9 (H) 03/26/2023

## 2023-08-15 NOTE — H&P (View-Only) (Signed)
GYNECOLOGIC ONCOLOGY NEW PATIENT CONSULTATION   Patient Name: Samantha Wang  Patient Age: 52 y.o. Date of Service: 08/16/23 Referring Provider: Dr. Conley Simmonds  Primary Care Provider: Tommie Sams, DO Consulting Provider: Eugene Garnet, MD   Assessment/Plan:  Peri vs postmenopausal patient with clinical stage I low-grade endometrial cancer.  We reviewed the nature of endometrial cancer and its recommended surgical staging, including total hysterectomy, bilateral salpingo-oophorectomy, and lymph node assessment. The patient is a suitable candidate for staging via a minimally invasive approach to surgery.  We reviewed that robotic assistance would be used to complete the surgery.     We discussed that most endometrial cancer is detected early and that decisions regarding adjuvant therapy will be made based on her final pathology.   We reviewed the sentinel lymph node technique. Risks and benefits of sentinel lymph node biopsy was reviewed. We reviewed the technique and ICG dye. The patient DOES NOT have an iodine allergy or known liver dysfunction. We reviewed the false negative rate (0.4%), and that 3% of patients with metastatic disease will not have it detected by SLN biopsy in endometrial cancer. A low risk of allergic reaction to the dye, <0.2% for ICG, has been reported. We also discussed that in the case of failed mapping, which occurs 40% of the time, a bilateral or unilateral lymphadenectomy will be performed at the surgeon's discretion.   Potential benefits of sentinel nodes including a higher detection rate for metastasis due to ultrastaging and potential reduction in operative morbidity. However, there remains uncertainty as to the role for treatment of micrometastatic disease. Further, the benefit of operative morbidity associated with the SLN technique in endometrial cancer is not yet completely known. In other patient populations (e.g. the cervical cancer population) there  has been observed reductions in morbidity with SLN biopsy compared to pelvic lymphadenectomy. Lymphedema, nerve dysfunction and lymphocysts are all potential risks with the SLN technique as with complete lymphadenectomy. Additional risks to the patient include the risk of damage to an internal organ while operating in an altered view (e.g. the black and white image of the robotic fluorescence imaging mode).   We discussed the plan for a robotic assisted hysterectomy, bilateral salpingo-oophorectomy, sentinel lymph node evaluation, possible lymph node dissection, possible laparotomy. The risks of surgery were discussed in detail and she understands these to include infection; wound separation; hernia; vaginal cuff separation, injury to adjacent organs such as bowel, bladder, blood vessels, ureters and nerves; bleeding which may require blood transfusion; anesthesia risk; thromboembolic events; possible death; unforeseen complications; possible need for re-exploration; medical complications such as heart attack, stroke, pleural effusion and pneumonia; and, if full lymphadenectomy is performed the risk of lymphedema and lymphocyst. The patient will receive DVT and antibiotic prophylaxis as indicated. She voiced a clear understanding. She had the opportunity to ask questions. Perioperative instructions were reviewed with her. Prescriptions for post-op medications were sent to her pharmacy of choice.  Given her abnormal MMR IHC, we will ask pathology to do MSI testing as well as MLH1 promoter hyper methylation testing.  In light of this as well as her young age, referral placed to genetics.  In terms of her dyspareunia and intermittent postcoital spotting, discussed that this may be in the setting of vaginal atrophy.  After she heals from surgery, pending final pathology results, we can discuss starting her on some vaginal estrogen.  A copy of this note was sent to the patient's referring provider.   65 minutes  of total  time was spent for this patient encounter, including preparation, face-to-face counseling with the patient and coordination of care, and documentation of the encounter.  Eugene Garnet, MD  Division of Gynecologic Oncology  Department of Obstetrics and Gynecology  [facility]  ___________________________________________  Chief Complaint: No chief complaint on file.   History of Present Illness:  Samantha Wang is a 52 y.o. y.o. female who is seen in consultation at the request of Dr. Edward Jolly for an evaluation of endometrial cancer.  The patient presented for her annual exam in mid September.  She had spotting that she endorsed at that time in August.  Her last menstrual cycle had been in October 2023.  She also endorsed some intermittent spotting as well as postcoital spotting.  Endometrial ultrasound was performed on 10/4 and showed a uterus measuring 8.7 x 3.7 x 5.2 cm with an endometrium measuring 9 mm.  Bilateral ovaries normal in appearance without masses.  Endometrial biopsy was performed on 08/12/23 and revealed grade 1 endometrioid endometrial adenocarcinoma. MMR with loss of MLH1 and PMS2.  Today, the patient presents with her husband.  She notes that since her last menstrual period about a year ago, she has had pink on the tissue when she wipes after voiding almost daily.  She has had spotting requiring her to wear a panty liner for a day on several occasions over the last year.  She denies any pain or cramping except for several days after her recent pelvic ultrasound.  She endorses a good appetite without nausea or emesis.  She reports normal bowel function.  Has noted some urinary frequency as well as possibly not completely emptying for the last few months.  She denies any recent weight changes.  PAST MEDICAL HISTORY:  Past Medical History:  Diagnosis Date   Allergy    Anemia    Anxiety    Breast nodule    left    GERD (gastroesophageal  reflux disease)    H/O cesarean section    history of 2 c-sections     History of hiatal hernia    Peptic ulcer disease    PONV (postoperative nausea and vomiting)    Reflux      PAST SURGICAL HISTORY:  Past Surgical History:  Procedure Laterality Date   BIOPSY  07/19/2017   Procedure: BIOPSY;  Surgeon: Malissa Hippo, MD;  Location: [facility];  Service: Endoscopy;;  duodenum   CESAREAN SECTION  1610,9604   CESAREAN SECTION     x2   COLONOSCOPY N/A 07/19/2017   Procedure: COLONOSCOPY;  Surgeon: Malissa Hippo, MD;  Location: [facility];  Service: Endoscopy;  Laterality: N/A;   ESOPHAGOGASTRODUODENOSCOPY N/A 10/11/2015   Procedure: ESOPHAGOGASTRODUODENOSCOPY (EGD);  Surgeon: Malissa Hippo, MD;  Location: [facility];  Service: Endoscopy;  Laterality: N/A;  1230   ESOPHAGOGASTRODUODENOSCOPY N/A 06/06/2016   Procedure: ESOPHAGOGASTRODUODENOSCOPY (EGD);  Surgeon: Malissa Hippo, MD;  Location: [facility];  Service: Endoscopy;  Laterality: N/A;  255   ESOPHAGOGASTRODUODENOSCOPY N/A 07/19/2017   Procedure: ESOPHAGOGASTRODUODENOSCOPY (EGD);  Surgeon: Malissa Hippo, MD;  Location: [facility];  Service: Endoscopy;  Laterality: N/A;  8:25   PARTIAL GASTRECTOMY  2017   ulcer surgery  08/2016   abdominal   UPPER GASTROINTESTINAL ENDOSCOPY      OB/GYN HISTORY:  OB History  Gravida Para Term Preterm AB Living  2 2 2     2   SAB IAB Ectopic Multiple Live Births               #  Outcome Date GA Lbr Len/2nd Weight Sex Type Anes PTL Lv  2 Term 06/27/02 [redacted]w[redacted]d  7 lb (3.175 kg) F CS-Classical     1 Term 04/21/97 [redacted]w[redacted]d  7 lb (3.175 kg) M CS-Classical       No LMP recorded. (Menstrual status: Irregular Periods).  Age at menarche: 52  Age at menopause: 50 Hx of HRT: denies Hx of STDs: denies Last pap: 2024- NIML, HR HPV negative History of abnormal pap smears: no  SCREENING STUDIES:  Last mammogram: 2024  Last colonoscopy: 2024 Last bone mineral density:  2016  MEDICATIONS: Outpatient Encounter Medications as of 08/16/2023  Medication Sig   acetaminophen (TYLENOL) 500 MG tablet Take by mouth.   clonazePAM (KLONOPIN) 0.5 MG tablet 1/2 to 1 po BID prn anxiety   fluticasone (FLONASE) 50 MCG/ACT nasal spray USE TWO SPRAY(S) IN EACH NOSTRIL ONCE DAILY AS NEEDED FOR CONGESTION --  **NEEDS  OFFICE  VISIT**   loratadine (CLARITIN) 10 MG tablet Take 1 tablet by mouth daily.   methocarbamol (ROBAXIN) 500 MG tablet Take 500 mg by mouth every 6 (six) hours as needed. (Patient not taking: Reported on 08/15/2023)   SUMAtriptan (IMITREX) 100 MG tablet TAKE 1 TABLET BY MOUTH AS NEEDED FOR MIGRAINE. MAY REPEAT ONCE AFTER 2 HOURS   No facility-administered encounter medications on file as of 08/16/2023.    ALLERGIES:  No Known Allergies   FAMILY HISTORY:  Family History  Problem Relation Age of Onset   Healthy Mother    Cancer Mother        brain cancer-2018   Hyperlipidemia Father    Healthy Brother    Lung cancer Paternal Grandmother    Breast cancer Paternal Grandmother    Healthy Daughter    Healthy Son    Cancer Paternal Uncle      SOCIAL HISTORY:  Social Connections: Moderately Integrated (03/25/2023)   Social Connection and Isolation Panel [NHANES]    Frequency of Communication with Friends and Family: More than three times a week    Frequency of Social Gatherings with Friends and Family: Once a week    Attends Religious Services: 1 to 4 times per year    Active Member of [employer] or Organizations: No    Attends Engineer, structural: Not on file    Marital Status: Married    REVIEW OF SYSTEMS:  + dyspareunia, vaginal bleeding, vaginal discharge Denies appetite changes, fevers, chills, fatigue, unexplained weight changes. Denies hearing loss, neck lumps or masses, mouth sores, ringing in ears or voice changes. Denies cough or wheezing.  Denies shortness of breath. Denies chest pain or palpitations. Denies leg swelling. Denies  abdominal distention, pain, blood in stools, constipation, diarrhea, nausea, vomiting, or early satiety. Denies pain with intercourse, dysuria, frequency, hematuria or incontinence. Denies joint pain, back pain or muscle pain/cramps. Denies itching, rash, or wounds. Denies dizziness, headaches, numbness or seizures. Denies swollen lymph nodes or glands, denies easy bruising or bleeding. Denies anxiety, depression, confusion, or decreased concentration.  Physical Exam:  Vital Signs for this encounter:  Blood pressure (!) 137/95, pulse 70, temperature 97.9 F (36.6 C), temperature source Oral, resp. rate 19, height 5\' 2"  (1.575 m), weight 158 lb 6.4 oz (71.8 kg), SpO2 100%. Body mass index is 28.97 kg/m. General: Alert, oriented, no acute distress.  HEENT: Normocephalic, atraumatic. Sclera anicteric.  Chest: Clear to auscultation bilaterally. No wheezes, rhonchi, or rales. Cardiovascular: Regular rate and rhythm, no murmurs, rubs, or gallops.  Abdomen: Normoactive bowel sounds. Soft,  nondistended, nontender to palpation. No masses or hepatosplenomegaly appreciated. No palpable fluid wave.  Healed midline supraumbilical laparotomy incision. Extremities: Grossly normal range of motion. Warm, well perfused. No edema bilaterally.  Skin: No rashes or lesions.  Lymphatics: No cervical, supraclavicular, or inguinal adenopathy.  GU:  Normal external female genitalia. No lesions. No discharge or bleeding.             Bladder/urethra:  No lesions or masses, well supported bladder             Vagina: Mildly atrophic vaginal mucosa, no lesions.             Cervix: Normal appearing, no lesions.             Uterus: Small, mobile, no parametrial involvement or nodularity.             Adnexa: No masses appreciated.  Rectal: Deferred.  LABORATORY AND RADIOLOGIC DATA:  Outside medical records were reviewed to synthesize the above history, along with the history and physical obtained during the visit.    Lab Results  Component Value Date   WBC 7.8 03/26/2023   HGB 14.4 03/26/2023   HCT 45.2 03/26/2023   PLT 281 03/26/2023   GLUCOSE 105 (H) 03/26/2023   CHOL 175 03/26/2023   TRIG 91 03/26/2023   HDL 70 03/26/2023   LDLCALC 88 03/26/2023   ALT 20 03/26/2023   AST 20 03/26/2023   NA 142 03/26/2023   K 4.3 03/26/2023   CL 103 03/26/2023   CREATININE 0.72 03/26/2023   BUN 16 03/26/2023   CO2 26 03/26/2023   TSH 1.450 07/29/2019   HGBA1C 5.9 (H) 03/26/2023

## 2023-08-16 ENCOUNTER — Other Ambulatory Visit: Payer: Self-pay | Admitting: Gynecologic Oncology

## 2023-08-16 ENCOUNTER — Inpatient Hospital Stay: Payer: 59 | Attending: Gynecologic Oncology | Admitting: Gynecologic Oncology

## 2023-08-16 ENCOUNTER — Inpatient Hospital Stay (HOSPITAL_BASED_OUTPATIENT_CLINIC_OR_DEPARTMENT_OTHER): Payer: 59 | Admitting: Gynecologic Oncology

## 2023-08-16 ENCOUNTER — Encounter: Payer: Self-pay | Admitting: Gynecologic Oncology

## 2023-08-16 ENCOUNTER — Other Ambulatory Visit: Payer: Self-pay

## 2023-08-16 ENCOUNTER — Encounter: Payer: Self-pay | Admitting: Oncology

## 2023-08-16 VITALS — BP 137/95 | HR 70 | Temp 97.9°F | Resp 19 | Ht 62.0 in | Wt 158.4 lb

## 2023-08-16 DIAGNOSIS — C541 Malignant neoplasm of endometrium: Secondary | ICD-10-CM

## 2023-08-16 DIAGNOSIS — E663 Overweight: Secondary | ICD-10-CM | POA: Diagnosis not present

## 2023-08-16 DIAGNOSIS — Z808 Family history of malignant neoplasm of other organs or systems: Secondary | ICD-10-CM | POA: Diagnosis not present

## 2023-08-16 DIAGNOSIS — N941 Unspecified dyspareunia: Secondary | ICD-10-CM | POA: Insufficient documentation

## 2023-08-16 DIAGNOSIS — N952 Postmenopausal atrophic vaginitis: Secondary | ICD-10-CM

## 2023-08-16 DIAGNOSIS — N930 Postcoital and contact bleeding: Secondary | ICD-10-CM | POA: Insufficient documentation

## 2023-08-16 DIAGNOSIS — N950 Postmenopausal bleeding: Secondary | ICD-10-CM

## 2023-08-16 DIAGNOSIS — K279 Peptic ulcer, site unspecified, unspecified as acute or chronic, without hemorrhage or perforation: Secondary | ICD-10-CM | POA: Diagnosis not present

## 2023-08-16 DIAGNOSIS — K219 Gastro-esophageal reflux disease without esophagitis: Secondary | ICD-10-CM | POA: Diagnosis not present

## 2023-08-16 DIAGNOSIS — Z79899 Other long term (current) drug therapy: Secondary | ICD-10-CM | POA: Insufficient documentation

## 2023-08-16 DIAGNOSIS — Z809 Family history of malignant neoplasm, unspecified: Secondary | ICD-10-CM | POA: Diagnosis not present

## 2023-08-16 DIAGNOSIS — Z6828 Body mass index (BMI) 28.0-28.9, adult: Secondary | ICD-10-CM | POA: Insufficient documentation

## 2023-08-16 DIAGNOSIS — Z801 Family history of malignant neoplasm of trachea, bronchus and lung: Secondary | ICD-10-CM | POA: Diagnosis not present

## 2023-08-16 DIAGNOSIS — Z803 Family history of malignant neoplasm of breast: Secondary | ICD-10-CM | POA: Diagnosis not present

## 2023-08-16 DIAGNOSIS — F419 Anxiety disorder, unspecified: Secondary | ICD-10-CM | POA: Insufficient documentation

## 2023-08-16 MED ORDER — SENNOSIDES-DOCUSATE SODIUM 8.6-50 MG PO TABS
2.0000 | ORAL_TABLET | Freq: Every day | ORAL | 0 refills | Status: DC
Start: 2023-08-16 — End: 2023-12-04

## 2023-08-16 MED ORDER — TRAMADOL HCL 50 MG PO TABS
50.0000 mg | ORAL_TABLET | Freq: Four times a day (QID) | ORAL | 0 refills | Status: DC | PRN
Start: 2023-08-16 — End: 2023-09-24

## 2023-08-16 NOTE — Progress Notes (Signed)
Requested MSI and MLH1 promoter hypermethylation on accession [id number] with [facility] via email.

## 2023-08-16 NOTE — Patient Instructions (Signed)
Preparing for your Surgery  Plan for surgery on August 21, 2023 with Dr. Eugene Garnet at [facility]. You will be scheduled for robotic assisted total laparoscopic hysterectomy (removal of the uterus and cervix), bilateral salpingo-oophorectomy (removal of both ovaries and fallopian tubes), sentinel lymph node biopsy, possible lymph node dissection, possible laparotomy (larger incision on your abdomen if needed).  Pre-operative Testing -You will receive a phone call from presurgical testing at [facility] to arrange for a pre-operative appointment and lab work.  -Bring your insurance card, copy of an advanced directive if applicable, medication list  -At that visit, you will be asked to sign a consent for a possible blood transfusion in case a transfusion becomes necessary during surgery.  The need for a blood transfusion is rare but having consent is a necessary part of your care.     -You should not be taking blood thinners or aspirin at least ten days prior to surgery unless instructed by your surgeon.  -Do not take supplements such as fish oil (omega 3), red yeast rice, turmeric before your surgery. You want to avoid medications with aspirin in them including headache powders such as BC or Goody's), Excedrin migraine.  Day Before Surgery at Home -You will be asked to take in a light diet the day before surgery. You will be advised you can have clear liquids up until 3 hours before your surgery.    Eat a light diet the day before surgery.  Examples including soups, broths, toast, yogurt, mashed potatoes.  AVOID GAS PRODUCING FOODS AND BEVERAGES. Things to avoid include carbonated beverages (fizzy beverages, sodas), raw fruits and raw vegetables (uncooked), or beans.   If your bowels are filled with gas, your surgeon will have difficulty visualizing your pelvic organs which increases your surgical risks.  Your role in recovery Your role is to become active as soon as  directed by your doctor, while still giving yourself time to heal.  Rest when you feel tired. You will be asked to do the following in order to speed your recovery:  - Cough and breathe deeply. This helps to clear and expand your lungs and can prevent pneumonia after surgery.  - STAY ACTIVE WHEN YOU GET HOME. Do mild physical activity. Walking or moving your legs help your circulation and body functions return to normal. Do not try to get up or walk alone the first time after surgery.   -If you develop swelling on one leg or the other, pain in the back of your leg, redness/warmth in one of your legs, please call the office or go to the Emergency Room to have a doppler to rule out a blood clot. For shortness of breath, chest pain-seek care in the Emergency Room as soon as possible. - Actively manage your pain. Managing your pain lets you move in comfort. We will ask you to rate your pain on a scale of zero to 10. It is your responsibility to tell your doctor or nurse where and how much you hurt so your pain can be treated.  Special Considerations -If you are diabetic, you may be placed on insulin after surgery to have closer control over your blood sugars to promote healing and recovery.  This does not mean that you will be discharged on insulin.  If applicable, your oral antidiabetics will be resumed when you are tolerating a solid diet.  -Your final pathology results from surgery should be available around one week after surgery and the results  will be relayed to you when available.  -FMLA forms can be faxed to [phone] and please allow 5-7 business days for completion.  Pain Management After Surgery -You will be prescribed your pain medication (oxycodone) and bowel regimen medications before surgery so that you can have these available when you are discharged from the hospital. The pain medication is for use ONLY AFTER surgery and a new prescription will not be given.   -Make sure that you  have Tylenol IF YOU ARE ABLE TO TAKE THESE MEDICATION at home to use on a regular basis after surgery for pain control.   -Review the attached handout on narcotic use and their risks and side effects.   Bowel Regimen -You will be prescribed Sennakot-S to take nightly to prevent constipation especially if you are taking the narcotic pain medication intermittently.  It is important to prevent constipation and drink adequate amounts of liquids. You can stop taking this medication when you are not taking pain medication and you are back on your normal bowel routine.  Risks of Surgery Risks of surgery are low but include bleeding, infection, damage to surrounding structures, re-operation, blood clots, and very rarely death.   Blood Transfusion Information (For the consent to be signed before surgery)  We will be checking your blood type before surgery so in case of emergencies, we will know what type of blood you would need.                                            WHAT IS A BLOOD TRANSFUSION?  A transfusion is the replacement of blood or some of its parts. Blood is made up of multiple cells which provide different functions. Red blood cells carry oxygen and are used for blood loss replacement. White blood cells fight against infection. Platelets control bleeding. Plasma helps clot blood. Other blood products are available for specialized needs, such as hemophilia or other clotting disorders. BEFORE THE TRANSFUSION  Who gives blood for transfusions?  You may be able to donate blood to be used at a later date on yourself (autologous donation). Relatives can be asked to donate blood. This is generally not any safer than if you have received blood from a stranger. The same precautions are taken to ensure safety when a relative's blood is donated. Healthy volunteers who are fully evaluated to make sure their blood is safe. This is blood bank blood. Transfusion therapy is the safest it has ever  been in the practice of medicine. Before blood is taken from a donor, a complete history is taken to make sure that person has no history of diseases nor engages in risky social behavior (examples are intravenous drug use or sexual activity with multiple partners). The donor's travel history is screened to minimize risk of transmitting infections, such as malaria. The donated blood is tested for signs of infectious diseases, such as HIV and hepatitis. The blood is then tested to be sure it is compatible with you in order to minimize the chance of a transfusion reaction. If you or a relative donates blood, this is often done in anticipation of surgery and is not appropriate for emergency situations. It takes many days to process the donated blood. RISKS AND COMPLICATIONS Although transfusion therapy is very safe and saves many lives, the main dangers of transfusion include:  Getting an infectious disease. Developing a transfusion reaction.  This is an allergic reaction to something in the blood you were given. Every precaution is taken to prevent this. The decision to have a blood transfusion has been considered carefully by your caregiver before blood is given. Blood is not given unless the benefits outweigh the risks.  AFTER SURGERY INSTRUCTIONS  Return to work: 4-6 weeks if applicable  Activity: 1. Be up and out of the bed during the day.  Take a nap if needed.  You may walk up steps but be careful and use the hand rail.  Stair climbing will tire you more than you think, you may need to stop part way and rest.   2. No lifting or straining for 6 weeks over 10 pounds. No pushing, pulling, straining for 6 weeks.  3. No driving for around 1 week(s).  Do not drive if you are taking narcotic pain medicine and make sure that your reaction time has returned.   4. You can shower as soon as the next day after surgery. Shower daily.  Use your regular soap and water (not directly on the incision) and pat your  incision(s) dry afterwards; don't rub.  No tub baths or submerging your body in water until cleared by your surgeon. If you have the soap that was given to you by pre-surgical testing that was used before surgery, you do not need to use it afterwards because this can irritate your incisions.   5. No sexual activity and nothing in the vagina for 10 weeks.  6. You may experience a small amount of clear drainage from your incisions, which is normal.  If the drainage persists, increases, or changes color please call the office.  7. Do not use creams, lotions, or ointments such as neosporin on your incisions after surgery until advised by your surgeon because they can cause removal of the dermabond glue on your incisions.    8. You may experience vaginal spotting after surgery or when the stitches at the top of the vagina begin to dissolve.  The spotting is normal but if you experience heavy bleeding, call our office.  9. Take Tylenol first for pain if you are able to take these medication and only use narcotic pain medication for severe pain not relieved by the Tylenol.  Monitor your Tylenol intake to a max of 4,000 mg in a 24 hour period.   Diet: 1. Low sodium Heart Healthy Diet is recommended but you are cleared to resume your normal (before surgery) diet after your procedure.  2. It is safe to use a laxative, such as Miralax or Colace, if you have difficulty moving your bowels. You have been prescribed Sennakot-S to take at bedtime every evening after surgery to keep bowel movements regular and to prevent constipation.    Wound Care: 1. Keep clean and dry.  Shower daily.  Reasons to call the Doctor: Fever - Oral temperature greater than 100.4 degrees Fahrenheit Foul-smelling vaginal discharge Difficulty urinating Nausea and vomiting Increased pain at the site of the incision that is unrelieved with pain medicine. Difficulty breathing with or without chest pain New calf pain especially if  only on one side Sudden, continuing increased vaginal bleeding with or without clots.   Contacts: For questions or concerns you should contact:  Dr. Eugene Garnet at [phone]  [name], NP at [phone]  After Hours: call [phone] and have the GYN Oncologist paged/contacted (after 5 pm or on the weekends). You will speak with an after hours RN and let he  or she know you have had surgery.  Messages sent via mychart are for non-urgent matters and are not responded to after hours so for urgent needs, please call the after hours number.

## 2023-08-16 NOTE — Patient Instructions (Addendum)
Preparing for your Surgery  Plan for surgery on August 21, 2023 with Dr. Eugene Garnet at [facility]. You will be scheduled for robotic assisted total laparoscopic hysterectomy (removal of the uterus and cervix), bilateral salpingo-oophorectomy (removal of both ovaries and fallopian tubes), sentinel lymph node biopsy, possible lymph node dissection, possible laparotomy (larger incision on your abdomen if needed).  Pre-operative Testing -You will receive a phone call from presurgical testing at [facility] to arrange for a pre-operative appointment and lab work.  -Bring your insurance card, copy of an advanced directive if applicable, medication list  -At that visit, you will be asked to sign a consent for a possible blood transfusion in case a transfusion becomes necessary during surgery.  The need for a blood transfusion is rare but having consent is a necessary part of your care.     -You should not be taking blood thinners or aspirin at least ten days prior to surgery unless instructed by your surgeon.  -Do not take supplements such as fish oil (omega 3), red yeast rice, turmeric before your surgery. You want to avoid medications with aspirin in them including headache powders such as BC or Goody's), Excedrin migraine.  Day Before Surgery at Home -You will be asked to take in a light diet the day before surgery. You will be advised you can have clear liquids up until 3 hours before your surgery.    Eat a light diet the day before surgery.  Examples including soups, broths, toast, yogurt, mashed potatoes.  AVOID GAS PRODUCING FOODS AND BEVERAGES. Things to avoid include carbonated beverages (fizzy beverages, sodas), raw fruits and raw vegetables (uncooked), or beans.   If your bowels are filled with gas, your surgeon will have difficulty visualizing your pelvic organs which increases your surgical risks.  Your role in recovery Your role is to become active as soon as  directed by your doctor, while still giving yourself time to heal.  Rest when you feel tired. You will be asked to do the following in order to speed your recovery:  - Cough and breathe deeply. This helps to clear and expand your lungs and can prevent pneumonia after surgery.  - STAY ACTIVE WHEN YOU GET HOME. Do mild physical activity. Walking or moving your legs help your circulation and body functions return to normal. Do not try to get up or walk alone the first time after surgery.   -If you develop swelling on one leg or the other, pain in the back of your leg, redness/warmth in one of your legs, please call the office or go to the Emergency Room to have a doppler to rule out a blood clot. For shortness of breath, chest pain-seek care in the Emergency Room as soon as possible. - Actively manage your pain. Managing your pain lets you move in comfort. We will ask you to rate your pain on a scale of zero to 10. It is your responsibility to tell your doctor or nurse where and how much you hurt so your pain can be treated.  Special Considerations -If you are diabetic, you may be placed on insulin after surgery to have closer control over your blood sugars to promote healing and recovery.  This does not mean that you will be discharged on insulin.  If applicable, your oral antidiabetics will be resumed when you are tolerating a solid diet.  -Your final pathology results from surgery should be available around one week after surgery and the results  will be relayed to you when available.  -FMLA forms can be faxed to [phone] and please allow 5-7 business days for completion.  Pain Management After Surgery -You will be prescribed your pain medication (oxycodone) and bowel regimen medications before surgery so that you can have these available when you are discharged from the hospital. The pain medication is for use ONLY AFTER surgery and a new prescription will not be given.   -Make sure that you  have Tylenol IF YOU ARE ABLE TO TAKE THESE MEDICATION at home to use on a regular basis after surgery for pain control.   -Review the attached handout on narcotic use and their risks and side effects.   Bowel Regimen -You will be prescribed Sennakot-S to take nightly to prevent constipation especially if you are taking the narcotic pain medication intermittently.  It is important to prevent constipation and drink adequate amounts of liquids. You can stop taking this medication when you are not taking pain medication and you are back on your normal bowel routine.  Risks of Surgery Risks of surgery are low but include bleeding, infection, damage to surrounding structures, re-operation, blood clots, and very rarely death.   Blood Transfusion Information (For the consent to be signed before surgery)  We will be checking your blood type before surgery so in case of emergencies, we will know what type of blood you would need.                                            WHAT IS A BLOOD TRANSFUSION?  A transfusion is the replacement of blood or some of its parts. Blood is made up of multiple cells which provide different functions. Red blood cells carry oxygen and are used for blood loss replacement. White blood cells fight against infection. Platelets control bleeding. Plasma helps clot blood. Other blood products are available for specialized needs, such as hemophilia or other clotting disorders. BEFORE THE TRANSFUSION  Who gives blood for transfusions?  You may be able to donate blood to be used at a later date on yourself (autologous donation). Relatives can be asked to donate blood. This is generally not any safer than if you have received blood from a stranger. The same precautions are taken to ensure safety when a relative's blood is donated. Healthy volunteers who are fully evaluated to make sure their blood is safe. This is blood bank blood. Transfusion therapy is the safest it has ever  been in the practice of medicine. Before blood is taken from a donor, a complete history is taken to make sure that person has no history of diseases nor engages in risky social behavior (examples are intravenous drug use or sexual activity with multiple partners). The donor's travel history is screened to minimize risk of transmitting infections, such as malaria. The donated blood is tested for signs of infectious diseases, such as HIV and hepatitis. The blood is then tested to be sure it is compatible with you in order to minimize the chance of a transfusion reaction. If you or a relative donates blood, this is often done in anticipation of surgery and is not appropriate for emergency situations. It takes many days to process the donated blood. RISKS AND COMPLICATIONS Although transfusion therapy is very safe and saves many lives, the main dangers of transfusion include:  Getting an infectious disease. Developing a transfusion reaction.  This is an allergic reaction to something in the blood you were given. Every precaution is taken to prevent this. The decision to have a blood transfusion has been considered carefully by your caregiver before blood is given. Blood is not given unless the benefits outweigh the risks.  AFTER SURGERY INSTRUCTIONS  Return to work: 4-6 weeks if applicable  Activity: 1. Be up and out of the bed during the day.  Take a nap if needed.  You may walk up steps but be careful and use the hand rail.  Stair climbing will tire you more than you think, you may need to stop part way and rest.   2. No lifting or straining for 6 weeks over 10 pounds. No pushing, pulling, straining for 6 weeks.  3. No driving for around 1 week(s).  Do not drive if you are taking narcotic pain medicine and make sure that your reaction time has returned.   4. You can shower as soon as the next day after surgery. Shower daily.  Use your regular soap and water (not directly on the incision) and pat your  incision(s) dry afterwards; don't rub.  No tub baths or submerging your body in water until cleared by your surgeon. If you have the soap that was given to you by pre-surgical testing that was used before surgery, you do not need to use it afterwards because this can irritate your incisions.   5. No sexual activity and nothing in the vagina for 10 weeks.  6. You may experience a small amount of clear drainage from your incisions, which is normal.  If the drainage persists, increases, or changes color please call the office.  7. Do not use creams, lotions, or ointments such as neosporin on your incisions after surgery until advised by your surgeon because they can cause removal of the dermabond glue on your incisions.    8. You may experience vaginal spotting after surgery or when the stitches at the top of the vagina begin to dissolve.  The spotting is normal but if you experience heavy bleeding, call our office.  9. Take Tylenol first for pain if you are able to take these medication and only use narcotic pain medication for severe pain not relieved by the Tylenol.  Monitor your Tylenol intake to a max of 4,000 mg in a 24 hour period.   Diet: 1. Low sodium Heart Healthy Diet is recommended but you are cleared to resume your normal (before surgery) diet after your procedure.  2. It is safe to use a laxative, such as Miralax or Colace, if you have difficulty moving your bowels. You have been prescribed Sennakot-S to take at bedtime every evening after surgery to keep bowel movements regular and to prevent constipation.    Wound Care: 1. Keep clean and dry.  Shower daily.  Reasons to call the Doctor: Fever - Oral temperature greater than 100.4 degrees Fahrenheit Foul-smelling vaginal discharge Difficulty urinating Nausea and vomiting Increased pain at the site of the incision that is unrelieved with pain medicine. Difficulty breathing with or without chest pain New calf pain especially if  only on one side Sudden, continuing increased vaginal bleeding with or without clots.   Contacts: For questions or concerns you should contact:  Dr. Eugene Garnet at [phone]  [name], NP at [phone]  After Hours: call [phone] and have the GYN Oncologist paged/contacted (after 5 pm or on the weekends). You will speak with an after hours RN and let he  or she know you have had surgery.  Messages sent via mychart are for non-urgent matters and are not responded to after hours so for urgent needs, please call the after hours number.

## 2023-08-16 NOTE — Progress Notes (Signed)
Patient here for new patient consultation with Dr. Pricilla Holm and for a pre-operative appointment prior to her scheduled surgery on 08/21/23. She is scheduled for robotic assisted total laparoscopic hysterectomy, bilateral salpingo-oophorectomy, sentinel lymph node biopsy, possible lymph node dissection, possible laparotomy. The surgery was discussed in detail.  See after visit summary for additional details.    Discussed post-op pain management in detail including the aspects of the enhanced recovery pathway.  Advised her that a new prescription would be sent in for tramadol and it is only to be used for after her upcoming surgery.  We discussed the use of tylenol post-op and to monitor for a maximum of 4,000 mg in a 24 hour period.  Also prescribed sennakot to be used after surgery and to hold if having loose stools.  Discussed bowel regimen in detail.     Discussed measures to take at home to prevent DVT including frequent mobility.  Reportable signs and symptoms of DVT discussed. Post-operative instructions discussed and expectations for after surgery. Incisional care discussed as well including reportable signs and symptoms including erythema, drainage, wound separation.     10 minutes spent preparing information and with the patient.  Verbalizing understanding of material discussed. No needs or concerns voiced at the end of the visit.   Advised patient to call for any needs.  Advised that her post-operative medications had been prescribed and could be picked up at any time.    This appointment is included in the global surgical bundle as pre-operative teaching and has no charge.

## 2023-08-19 LAB — CYTOLOGY - PAP
Comment: NEGATIVE
Diagnosis: NEGATIVE
High risk HPV: NEGATIVE

## 2023-08-19 NOTE — Patient Instructions (Signed)
DUE TO COVID-19 ONLY TWO VISITORS  (aged 52 and older)  ARE ALLOWED TO COME WITH YOU AND STAY IN THE WAITING ROOM ONLY DURING PRE OP AND PROCEDURE.   **NO VISITORS ARE ALLOWED IN THE SHORT STAY AREA OR RECOVERY ROOM!!**  IF YOU WILL BE ADMITTED INTO THE HOSPITAL YOU ARE ALLOWED ONLY FOUR SUPPORT PEOPLE DURING VISITATION HOURS ONLY (7 AM -8PM)   The support person(s) must pass our screening, gel in and out, and wear a mask at all times, including in the patient's room. Patients must also wear a mask when staff or their support person are in the room. Visitors GUEST BADGE MUST BE WORN VISIBLY  One adult visitor may remain with you overnight and MUST be in the room by 8 P.M.     Your procedure is scheduled on: 08/21/23   Report to [facility] Main Entrance    Report to admitting at : 5:15 AM   Call this number if you have problems the morning of surgery [phone]   Eat a light diet the day before surgery.  Examples including soups, broths, toast, yogurt, mashed potatoes.  Things to avoid include carbonated beverages (fizzy beverages), raw fruits and raw vegetables, or beans.   If your bowels are filled with gas, your surgeon will have difficulty visualizing your pelvic organs which increases your surgical risks.  Do not eat food : After Midnight.   After Midnight you may have the following liquids until : 4:30 AM DAY OF SURGERY  Water Black Coffee (sugar ok, NO MILK/CREAM OR CREAMERS)  Tea (sugar ok, NO MILK/CREAM OR CREAMERS) regular and decaf                             Plain Jell-O (NO RED)                                           Fruit ices (not with fruit pulp, NO RED)                                     Popsicles (NO RED)                                                                  Juice: apple, WHITE grape, WHITE cranberry Sports drinks like Gatorade (NO RED)             FOLLOW ANY ADDITIONAL PRE OP INSTRUCTIONS YOU RECEIVED FROM YOUR SURGEON'S OFFICE!!!    Oral Hygiene is also important to reduce your risk of infection.                                    Remember - BRUSH YOUR TEETH THE MORNING OF SURGERY WITH YOUR REGULAR TOOTHPASTE  DENTURES WILL BE REMOVED PRIOR TO SURGERY PLEASE DO NOT APPLY "Poly grip" OR ADHESIVES!!!   Do NOT smoke after Midnight   Take these medicines the morning of surgery with A SIP OF  WATER: loratadine.Use Flonase as usual.Tylenol,clonazepam,sumatriptan as needed.                              You may not have any metal on your body including hair pins, jewelry, and body piercing             Do not wear make-up, lotions, powders, perfumes/cologne, or deodorant  Do not wear nail polish including gel and S&S, artificial/acrylic nails, or any other type of covering on natural nails including finger and toenails. If you have artificial nails, gel coating, etc. that needs to be removed by a nail salon please have this removed prior to surgery or surgery may need to be canceled/ delayed if the surgeon/ anesthesia feels like they are unable to be safely monitored.   Do not shave  48 hours prior to surgery.    Do not bring valuables to the hospital. [facility] IS NOT             RESPONSIBLE   FOR VALUABLES.   Contacts, glasses, or bridgework may not be worn into surgery.   Bring small overnight bag day of surgery.   DO NOT BRING YOUR HOME MEDICATIONS TO THE HOSPITAL. PHARMACY WILL DISPENSE MEDICATIONS LISTED ON YOUR MEDICATION LIST TO YOU DURING YOUR ADMISSION IN THE HOSPITAL!    Patients discharged on the day of surgery will not be allowed to drive home.  Someone NEEDS to stay with you for the first 24 hours after anesthesia.   Special Instructions: Bring a copy of your healthcare power of attorney and living will documents         the day of surgery if you haven't scanned them before.              Please read over the following fact sheets you were given: IF YOU HAVE QUESTIONS ABOUT YOUR PRE-OP INSTRUCTIONS PLEASE  CALL [phone]    [facility] - Preparing for Surgery Before surgery, you can play an important role.  Because skin is not sterile, your skin needs to be as free of germs as possible.  You can reduce the number of germs on your skin by washing with CHG (chlorahexidine gluconate) soap before surgery.  CHG is an antiseptic cleaner which kills germs and bonds with the skin to continue killing germs even after washing. Please DO NOT use if you have an allergy to CHG or antibacterial soaps.  If your skin becomes reddened/irritated stop using the CHG and inform your nurse when you arrive at Short Stay. Do not shave (including legs and underarms) for at least 48 hours prior to the first CHG shower.  You may shave your face/neck. Please follow these instructions carefully:  1.  Shower with CHG Soap the night before surgery and the  morning of Surgery.  2.  If you choose to wash your hair, wash your hair first as usual with your  normal  shampoo.  3.  After you shampoo, rinse your hair and body thoroughly to remove the  shampoo.                           4.  Use CHG as you would any other liquid soap.  You can apply chg directly  to the skin and wash  Gently with a scrungie or clean washcloth.  5.  Apply the CHG Soap to your body ONLY FROM THE NECK DOWN.   Do not use on face/ open                           Wound or open sores. Avoid contact with eyes, ears mouth and genitals (private parts).                       Wash face,  Genitals (private parts) with your normal soap.             6.  Wash thoroughly, paying special attention to the area where your surgery  will be performed.  7.  Thoroughly rinse your body with warm water from the neck down.  8.  DO NOT shower/wash with your normal soap after using and rinsing off  the CHG Soap.                9.  Pat yourself dry with a clean towel.            10.  Wear clean pajamas.            11.  Place clean sheets on your bed the night of  your first shower and do not  sleep with pets. Day of Surgery : Do not apply any lotions/deodorants the morning of surgery.  Please wear clean clothes to the hospital/surgery center.  FAILURE TO FOLLOW THESE INSTRUCTIONS MAY RESULT IN THE CANCELLATION OF YOUR SURGERY PATIENT SIGNATURE_________________________________  NURSE SIGNATURE__________________________________  ________________________________________________________________________ WHAT IS A BLOOD TRANSFUSION? Blood Transfusion Information  A transfusion is the replacement of blood or some of its parts. Blood is made up of multiple cells which provide different functions. Red blood cells carry oxygen and are used for blood loss replacement. White blood cells fight against infection. Platelets control bleeding. Plasma helps clot blood. Other blood products are available for specialized needs, such as hemophilia or other clotting disorders. BEFORE THE TRANSFUSION  Who gives blood for transfusions?  Healthy volunteers who are fully evaluated to make sure their blood is safe. This is blood bank blood. Transfusion therapy is the safest it has ever been in the practice of medicine. Before blood is taken from a donor, a complete history is taken to make sure that person has no history of diseases nor engages in risky social behavior (examples are intravenous drug use or sexual activity with multiple partners). The donor's travel history is screened to minimize risk of transmitting infections, such as malaria. The donated blood is tested for signs of infectious diseases, such as HIV and hepatitis. The blood is then tested to be sure it is compatible with you in order to minimize the chance of a transfusion reaction. If you or a relative donates blood, this is often done in anticipation of surgery and is not appropriate for emergency situations. It takes many days to process the donated blood. RISKS AND COMPLICATIONS Although transfusion  therapy is very safe and saves many lives, the main dangers of transfusion include:  Getting an infectious disease. Developing a transfusion reaction. This is an allergic reaction to something in the blood you were given. Every precaution is taken to prevent this. The decision to have a blood transfusion has been considered carefully by your caregiver before blood is given. Blood is not given unless the benefits outweigh the risks. AFTER THE TRANSFUSION Right after receiving  a blood transfusion, you will usually feel much better and more energetic. This is especially true if your red blood cells have gotten low (anemic). The transfusion raises the level of the red blood cells which carry oxygen, and this usually causes an energy increase. The nurse administering the transfusion will monitor you carefully for complications. HOME CARE INSTRUCTIONS  No special instructions are needed after a transfusion. You may find your energy is better. Speak with your caregiver about any limitations on activity for underlying diseases you may have. SEEK MEDICAL CARE IF:  Your condition is not improving after your transfusion. You develop redness or irritation at the intravenous (IV) site. SEEK IMMEDIATE MEDICAL CARE IF:  Any of the following symptoms occur over the next 12 hours: Shaking chills. You have a temperature by mouth above 102 F (38.9 C), not controlled by medicine. Chest, back, or muscle pain. People around you feel you are not acting correctly or are confused. Shortness of breath or difficulty breathing. Dizziness and fainting. You get a rash or develop hives. You have a decrease in urine output. Your urine turns a dark color or changes to pink, red, or brown. Any of the following symptoms occur over the next 10 days: You have a temperature by mouth above 102 F (38.9 C), not controlled by medicine. Shortness of breath. Weakness after normal activity. The white part of the eye turns yellow  (jaundice). You have a decrease in the amount of urine or are urinating less often. Your urine turns a dark color or changes to pink, red, or brown. Document Released: 10/05/2000 Document Revised: 12/31/2011 Document Reviewed: 05/24/2008 [employer] [facility] Patient Information 2014 [place], [place].  _______________________________________________________________________

## 2023-08-20 ENCOUNTER — Encounter (HOSPITAL_COMMUNITY)
Admission: RE | Admit: 2023-08-20 | Discharge: 2023-08-20 | Disposition: A | Discharge status: Home / Self Care | Payer: 59 | Source: Ambulatory Visit | Attending: Gynecologic Oncology | Admitting: Gynecologic Oncology

## 2023-08-20 ENCOUNTER — Other Ambulatory Visit: Payer: Self-pay

## 2023-08-20 ENCOUNTER — Encounter (HOSPITAL_COMMUNITY): Payer: Self-pay

## 2023-08-20 ENCOUNTER — Telehealth: Payer: Self-pay | Admitting: *Deleted

## 2023-08-20 DIAGNOSIS — Z01812 Encounter for preprocedural laboratory examination: Secondary | ICD-10-CM | POA: Insufficient documentation

## 2023-08-20 DIAGNOSIS — C541 Malignant neoplasm of endometrium: Secondary | ICD-10-CM | POA: Insufficient documentation

## 2023-08-20 HISTORY — DX: Malignant (primary) neoplasm, unspecified: C80.1

## 2023-08-20 LAB — COMPREHENSIVE METABOLIC PANEL
ALT: 18 U/L (ref 0–44)
AST: 18 U/L (ref 15–41)
Albumin: 3.9 g/dL (ref 3.5–5.0)
Alkaline Phosphatase: 59 U/L (ref 38–126)
Anion gap: 7 (ref 5–15)
BUN: 16 mg/dL (ref 6–20)
CO2: 27 mmol/L (ref 22–32)
Calcium: 9.1 mg/dL (ref 8.9–10.3)
Chloride: 106 mmol/L (ref 98–111)
Creatinine, Ser: 0.65 mg/dL (ref 0.44–1.00)
GFR, Estimated: >60 mL/min (ref >60)
Glucose, Bld: 130 mg/dL — ABNORMAL HIGH (ref 70–99)
Potassium: 3.8 mmol/L (ref 3.5–5.1)
Sodium: 140 mmol/L (ref 135–145)
Total Bilirubin: 0.8 mg/dL (ref 0.3–1.2)
Total Protein: 7.4 g/dL (ref 6.5–8.1)

## 2023-08-20 LAB — CBC
HCT: 43.3 % (ref 36.0–46.0)
Hemoglobin: 14.2 g/dL (ref 12.0–15.0)
MCH: 30.3 pg (ref 26.0–34.0)
MCHC: 32.8 g/dL (ref 30.0–36.0)
MCV: 92.5 fL (ref 80.0–100.0)
Platelets: 272 10*3/uL (ref 150–400)
RBC: 4.68 MIL/uL (ref 3.87–5.11)
RDW: 12.9 % (ref 11.5–15.5)
WBC: 5.3 10*3/uL (ref 4.0–10.5)
nRBC: 0 % (ref 0.0–0.2)

## 2023-08-20 NOTE — Progress Notes (Signed)
For Short Stay: COVID SWAB appointment date:  Bowel Prep reminder:   For Anesthesia: PCP - Tommie Sams, DO  Cardiologist - N/A Chest x-ray -  EKG -  Stress Test -  ECHO -  Cardiac Cath -  Pacemaker/ICD device last checked: Pacemaker orders received: Device Rep notified:  Spinal Cord Stimulator:N/A  Sleep Study - N/A CPAP -   Fasting Blood Sugar - N/A Checks Blood Sugar _____ times a day Date and result of last Hgb A1c-  Last dose of GLP1 agonist- N/A GLP1 instructions:   Last dose of SGLT-2 inhibitors- N/A SGLT-2 instructions:   Blood Thinner Instructions:N/A Aspirin Instructions: Last Dose:  Activity level: Can go up a flight of stairs and activities of daily living without stopping and without chest pain and/or shortness of breath   Able to exercise without chest pain and/or shortness of breath  Anesthesia review:   Patient denies shortness of breath, fever, cough and chest pain at PAT appointment   Patient verbalized understanding of instructions that were given to them at the PAT appointment. Patient was also instructed that they will need to review over the PAT instructions again at home before surgery.

## 2023-08-20 NOTE — Telephone Encounter (Signed)
Telephone call to check on pre-operative status.  Patient compliant with pre-operative instructions.  Reinforced nothing to eat after midnight. Clear liquids until 0415. Patient to arrive at [facility].  No questions or concerns voiced.  Instructed to call for any needs.

## 2023-08-20 NOTE — Anesthesia Preprocedure Evaluation (Signed)
Anesthesia Evaluation  Patient identified by MRN, date of birth, ID band Patient awake    Reviewed: Allergy & Precautions, NPO status , Patient's Chart, lab work & pertinent test results  History of Anesthesia Complications (+) PONV and history of anesthetic complications  Airway Mallampati: II  TM Distance: >3 FB Neck ROM: Full    Dental no notable dental hx. (+) Dental Advisory Given, Teeth Intact   Pulmonary neg pulmonary ROS   Pulmonary exam normal breath sounds clear to auscultation       Cardiovascular negative cardio ROS Normal cardiovascular exam Rhythm:Regular Rate:Normal     Neuro/Psych  Headaches PSYCHIATRIC DISORDERS Anxiety Depression       GI/Hepatic Neg liver ROS, hiatal hernia, PUD,GERD  ,,  Endo/Other  negative endocrine ROS    Renal/GU negative Renal ROS     Musculoskeletal negative musculoskeletal ROS (+)    Abdominal   Peds  Hematology  (+) Blood dyscrasia, anemia   Anesthesia Other Findings   Reproductive/Obstetrics                             Anesthesia Physical Anesthesia Plan  ASA: 2  Anesthesia Plan: General   Post-op Pain Management: Tylenol PO (pre-op)* and Gabapentin PO (pre-op)*   Induction: Intravenous  PONV Risk Score and Plan: 4 or greater and Treatment may vary due to age or medical condition, Midazolam, Scopolamine patch - Pre-op, Ondansetron and Dexamethasone  Airway Management Planned: Oral ETT  Additional Equipment:   Intra-op Plan:   Post-operative Plan: Extubation in OR  Informed Consent: I have reviewed the patients History and Physical, chart, labs and discussed the procedure including the risks, benefits and alternatives for the proposed anesthesia with the patient or authorized representative who has indicated his/her understanding and acceptance.     Dental advisory given  Plan Discussed with: CRNA  Anesthesia Plan Comments:  (2 x PIV)       Anesthesia Quick Evaluation

## 2023-08-21 ENCOUNTER — Ambulatory Visit (HOSPITAL_COMMUNITY): Payer: 59 | Admitting: Anesthesiology

## 2023-08-21 ENCOUNTER — Encounter (HOSPITAL_COMMUNITY): Payer: Self-pay | Admitting: Gynecologic Oncology

## 2023-08-21 ENCOUNTER — Other Ambulatory Visit: Payer: Self-pay

## 2023-08-21 ENCOUNTER — Encounter (HOSPITAL_COMMUNITY)
Admission: RE | Disposition: A | Discharge status: Home / Self Care | Payer: Self-pay | Source: Home / Self Care | Attending: Gynecologic Oncology

## 2023-08-21 ENCOUNTER — Ambulatory Visit (HOSPITAL_COMMUNITY)
Admission: RE | Admit: 2023-08-21 | Discharge: 2023-08-21 | Disposition: A | Discharge status: Home / Self Care | Payer: 59 | Attending: Gynecologic Oncology | Admitting: Gynecologic Oncology

## 2023-08-21 DIAGNOSIS — K660 Peritoneal adhesions (postprocedural) (postinfection): Secondary | ICD-10-CM | POA: Diagnosis not present

## 2023-08-21 DIAGNOSIS — K219 Gastro-esophageal reflux disease without esophagitis: Secondary | ICD-10-CM | POA: Insufficient documentation

## 2023-08-21 DIAGNOSIS — C541 Malignant neoplasm of endometrium: Secondary | ICD-10-CM

## 2023-08-21 HISTORY — PX: SENTINEL NODE BIOPSY: SHX6608

## 2023-08-21 HISTORY — PX: ROBOTIC ASSISTED TOTAL HYSTERECTOMY WITH BILATERAL SALPINGO OOPHERECTOMY: SHX6086

## 2023-08-21 HISTORY — DX: Peritoneal adhesions (postprocedural) (postinfection): K66.0

## 2023-08-21 LAB — POCT PREGNANCY, URINE: Preg Test, Ur: NEGATIVE

## 2023-08-21 LAB — TYPE AND SCREEN
ABO/RH(D): A POS
Antibody Screen: NEGATIVE

## 2023-08-21 LAB — ABO/RH: ABO/RH(D): A POS

## 2023-08-21 SURGERY — HYSTERECTOMY, TOTAL, ROBOT-ASSISTED, LAPAROSCOPIC, WITH BILATERAL SALPINGO-OOPHORECTOMY
Anesthesia: General | Site: Abdomen

## 2023-08-21 MED ORDER — PROPOFOL 10 MG/ML IV BOLUS
INTRAVENOUS | Status: AC
  Filled 2023-08-21: qty 20

## 2023-08-21 MED ORDER — BUPIVACAINE HCL 0.25 % IJ SOLN
INTRAMUSCULAR | Status: AC
  Filled 2023-08-21: qty 1

## 2023-08-21 MED ORDER — HYDROMORPHONE HCL 1 MG/ML IJ SOLN
INTRAMUSCULAR | Status: AC
  Filled 2023-08-21: qty 1

## 2023-08-21 MED ORDER — KETAMINE HCL 10 MG/ML IJ SOLN
INTRAMUSCULAR | Status: DC | PRN
  Administered 2023-08-21 (×2): 25 mg via INTRAVENOUS

## 2023-08-21 MED ORDER — LACTATED RINGERS IR SOLN
Status: DC | PRN
  Administered 2023-08-21: 1000 mL

## 2023-08-21 MED ORDER — METRONIDAZOLE 500 MG/100ML IV SOLN
500.0000 mg | INTRAVENOUS | Status: AC
  Administered 2023-08-21: 500 mg via INTRAVENOUS
  Filled 2023-08-21: qty 100

## 2023-08-21 MED ORDER — SUGAMMADEX SODIUM 200 MG/2ML IV SOLN
INTRAVENOUS | Status: DC | PRN
  Administered 2023-08-21: 200 mg via INTRAVENOUS

## 2023-08-21 MED ORDER — ROCURONIUM BROMIDE 100 MG/10ML IV SOLN
INTRAVENOUS | Status: DC | PRN
  Administered 2023-08-21: 10 mg via INTRAVENOUS
  Administered 2023-08-21: 70 mg via INTRAVENOUS

## 2023-08-21 MED ORDER — GABAPENTIN 300 MG PO CAPS
300.0000 mg | ORAL_CAPSULE | ORAL | Status: AC
Start: 2023-08-21 — End: 2023-08-21
  Administered 2023-08-21: 300 mg via ORAL
  Filled 2023-08-21: qty 1

## 2023-08-21 MED ORDER — ALBUMIN HUMAN 5 % IV SOLN
INTRAVENOUS | Status: AC
  Filled 2023-08-21: qty 250

## 2023-08-21 MED ORDER — ONDANSETRON HCL 4 MG/2ML IJ SOLN
INTRAMUSCULAR | Status: AC
  Filled 2023-08-21: qty 2

## 2023-08-21 MED ORDER — CHLORHEXIDINE GLUCONATE 0.12 % MT SOLN
15.0000 mL | Freq: Once | OROMUCOSAL | Status: AC
Start: 2023-08-21 — End: 2023-08-21
  Administered 2023-08-21: 15 mL via OROMUCOSAL

## 2023-08-21 MED ORDER — LIDOCAINE HCL (CARDIAC) PF 100 MG/5ML IV SOSY
PREFILLED_SYRINGE | INTRAVENOUS | Status: DC | PRN
  Administered 2023-08-21: 70 mg via INTRAVENOUS

## 2023-08-21 MED ORDER — ACETAMINOPHEN 500 MG PO TABS
1000.0000 mg | ORAL_TABLET | ORAL | Status: AC
  Administered 2023-08-21: 1000 mg via ORAL
  Filled 2023-08-21: qty 2

## 2023-08-21 MED ORDER — HEPARIN SODIUM (PORCINE) 5000 UNIT/ML IJ SOLN
5000.0000 [IU] | INTRAMUSCULAR | Status: AC
  Administered 2023-08-21: 5000 [IU] via SUBCUTANEOUS
  Filled 2023-08-21: qty 1

## 2023-08-21 MED ORDER — DEXAMETHASONE SODIUM PHOSPHATE 10 MG/ML IJ SOLN
INTRAMUSCULAR | Status: AC
  Filled 2023-08-21: qty 1

## 2023-08-21 MED ORDER — HYDROMORPHONE HCL 1 MG/ML IJ SOLN
0.2500 mg | INTRAMUSCULAR | Status: DC | PRN
  Administered 2023-08-21 (×2): 0.5 mg via INTRAVENOUS

## 2023-08-21 MED ORDER — SODIUM CHLORIDE 0.9% FLUSH: 3.0000 mL | Freq: Two times a day (BID) | INTRAVENOUS | Status: DC

## 2023-08-21 MED ORDER — DROPERIDOL 2.5 MG/ML IJ SOLN
INTRAMUSCULAR | Status: AC
  Filled 2023-08-21: qty 2

## 2023-08-21 MED ORDER — SODIUM CHLORIDE 0.9 % IV SOLN: INTRAVENOUS | Status: DC | PRN

## 2023-08-21 MED ORDER — DROPERIDOL 2.5 MG/ML IJ SOLN
0.6250 mg | Freq: Once | INTRAMUSCULAR | Status: AC | PRN
  Administered 2023-08-21: 0.625 mg via INTRAVENOUS

## 2023-08-21 MED ORDER — PROPOFOL 10 MG/ML IV BOLUS
INTRAVENOUS | Status: DC | PRN
  Administered 2023-08-21: 150 mg via INTRAVENOUS

## 2023-08-21 MED ORDER — ONDANSETRON HCL 4 MG/2ML IJ SOLN
INTRAMUSCULAR | Status: DC | PRN
  Administered 2023-08-21: 4 mg via INTRAVENOUS

## 2023-08-21 MED ORDER — BUPIVACAINE HCL 0.25 % IJ SOLN
INTRAMUSCULAR | Status: DC | PRN
  Administered 2023-08-21: 27 mL

## 2023-08-21 MED ORDER — STERILE WATER FOR INJECTION IJ SOLN
INTRAMUSCULAR | Status: AC
  Filled 2023-08-21: qty 10

## 2023-08-21 MED ORDER — SCOPOLAMINE 1 MG/3DAYS TD PT72
1.0000 | MEDICATED_PATCH | TRANSDERMAL | Status: DC
  Administered 2023-08-21: 1.5 mg via TRANSDERMAL
  Filled 2023-08-21: qty 1

## 2023-08-21 MED ORDER — FENTANYL CITRATE (PF) 100 MCG/2ML IJ SOLN
INTRAMUSCULAR | Status: DC | PRN
  Administered 2023-08-21: 50 ug via INTRAVENOUS

## 2023-08-21 MED ORDER — CEFAZOLIN SODIUM-DEXTROSE 2-4 GM/100ML-% IV SOLN
2.0000 g | INTRAVENOUS | Status: AC
  Administered 2023-08-21: 2 g via INTRAVENOUS
  Filled 2023-08-21: qty 100

## 2023-08-21 MED ORDER — DEXAMETHASONE SODIUM PHOSPHATE 4 MG/ML IJ SOLN
4.0000 mg | INTRAMUSCULAR | Status: AC
  Administered 2023-08-21: 5 mg via INTRAVENOUS

## 2023-08-21 MED ORDER — LACTATED RINGERS IV SOLN
INTRAVENOUS | Status: DC
Start: 2023-08-21 — End: 2023-08-21

## 2023-08-21 MED ORDER — STERILE WATER FOR INJECTION IJ SOLN
INTRAMUSCULAR | Status: DC | PRN
  Administered 2023-08-21: 10 mL via SURGICAL_CAVITY

## 2023-08-21 MED ORDER — MEPERIDINE HCL 50 MG/ML IJ SOLN
6.2500 mg | INTRAMUSCULAR | Status: DC | PRN
Start: 2023-08-21 — End: 2023-08-21

## 2023-08-21 MED ORDER — FENTANYL CITRATE (PF) 100 MCG/2ML IJ SOLN
INTRAMUSCULAR | Status: AC
  Filled 2023-08-21: qty 2

## 2023-08-21 MED ORDER — STERILE WATER FOR INJECTION IJ SOLN
INTRAMUSCULAR | Status: DC | PRN
  Administered 2023-08-21: 1 mL

## 2023-08-21 MED ORDER — MIDAZOLAM HCL 2 MG/2ML IJ SOLN
INTRAMUSCULAR | Status: AC
  Filled 2023-08-21: qty 2

## 2023-08-21 MED ORDER — KETAMINE HCL 50 MG/5ML IJ SOSY
PREFILLED_SYRINGE | INTRAMUSCULAR | Status: AC
  Filled 2023-08-21: qty 5

## 2023-08-21 MED ORDER — ORAL CARE MOUTH RINSE: 15.0000 mL | Freq: Once | OROMUCOSAL | Status: AC

## 2023-08-21 MED ORDER — STERILE WATER FOR IRRIGATION IR SOLN
Status: DC | PRN
  Administered 2023-08-21: 1000 mL

## 2023-08-21 MED ORDER — MIDAZOLAM HCL 5 MG/5ML IJ SOLN
INTRAMUSCULAR | Status: DC | PRN
  Administered 2023-08-21: 2 mg via INTRAVENOUS

## 2023-08-21 SURGICAL SUPPLY — 79 items
ADH SKN CLS APL DERMABOND .7 (GAUZE/BANDAGES/DRESSINGS) ×2
AGENT HMST KT MTR STRL THRMB (HEMOSTASIS)
APL ESCP 34 STRL LF DISP (HEMOSTASIS)
APPLICATOR SURGIFLO ENDO (HEMOSTASIS) IMPLANT
BAG COUNTER SPONGE SURGICOUNT (BAG) IMPLANT
BAG LAPAROSCOPIC 12 15 PORT 16 (BASKET) IMPLANT
BAG RETRIEVAL 12/15 (BASKET)
BAG SPNG CNTER NS LX DISP (BAG)
BLADE SURG SZ10 CARB STEEL (BLADE) IMPLANT
COVER BACK TABLE 60X90IN (DRAPES) ×2 IMPLANT
COVER TIP SHEARS 8 DVNC (MISCELLANEOUS) ×2 IMPLANT
DERMABOND ADVANCED .7 DNX12 (GAUZE/BANDAGES/DRESSINGS) ×2 IMPLANT
DRAPE ARM DVNC X/XI (DISPOSABLE) ×8 IMPLANT
DRAPE COLUMN DVNC XI (DISPOSABLE) ×2 IMPLANT
DRAPE SHEET LG 3/4 BI-LAMINATE (DRAPES) ×2 IMPLANT
DRAPE SURG IRRIG POUCH 19X23 (DRAPES) ×2 IMPLANT
DRIVER NDL MEGA SUTCUT DVNCXI (INSTRUMENTS) ×1 IMPLANT
DRIVER NDLE MEGA SUTCUT DVNCXI (INSTRUMENTS) ×2
DRSG OPSITE POSTOP 4X6 (GAUZE/BANDAGES/DRESSINGS) IMPLANT
DRSG OPSITE POSTOP 4X8 (GAUZE/BANDAGES/DRESSINGS) IMPLANT
ELECT PENCIL ROCKER SW 15FT (MISCELLANEOUS) IMPLANT
ELECT REM PT RETURN 15FT ADLT (MISCELLANEOUS) ×2 IMPLANT
FORCEPS BPLR FENES DVNC XI (FORCEP) ×2 IMPLANT
FORCEPS PROGRASP DVNC XI (FORCEP) ×2 IMPLANT
GAUZE 4X4 16PLY ~~LOC~~+RFID DBL (SPONGE) ×4 IMPLANT
GLOVE BIO SURGEON STRL SZ 6 (GLOVE) ×8 IMPLANT
GLOVE BIO SURGEON STRL SZ 6.5 (GLOVE) ×2 IMPLANT
GOWN STRL REUS W/ TWL LRG LVL3 (GOWN DISPOSABLE) ×8 IMPLANT
GOWN STRL REUS W/TWL LRG LVL3 (GOWN DISPOSABLE) ×8
GRASPER SUT TROCAR 14GX15 (MISCELLANEOUS) IMPLANT
HOLDER FOLEY CATH W/STRAP (MISCELLANEOUS) IMPLANT
IRRIG SUCT STRYKERFLOW 2 WTIP (MISCELLANEOUS) ×2
IRRIGATION SUCT STRKRFLW 2 WTP (MISCELLANEOUS) ×1 IMPLANT
KIT PROCEDURE DVNC SI (MISCELLANEOUS) ×1 IMPLANT
KIT TURNOVER KIT A (KITS) IMPLANT
LIGASURE IMPACT 36 18CM CVD LR (INSTRUMENTS) IMPLANT
MANIPULATOR ADVINCU DEL 3.0 PL (MISCELLANEOUS) IMPLANT
MANIPULATOR ADVINCU DEL 3.5 PL (MISCELLANEOUS) IMPLANT
MANIPULATOR UTERINE 4.5 ZUMI (MISCELLANEOUS) ×1 IMPLANT
NDL HYPO 21X1.5 SAFETY (NEEDLE) ×1 IMPLANT
NDL SPNL 18GX3.5 QUINCKE PK (NEEDLE) IMPLANT
NEEDLE HYPO 21X1.5 SAFETY (NEEDLE) ×2
NEEDLE SPNL 18GX3.5 QUINCKE PK (NEEDLE)
OBTURATOR OPTICAL STND 8 DVNC (TROCAR) ×2
OBTURATOR OPTICALSTD 8 DVNC (TROCAR) ×1 IMPLANT
PACK ROBOT GYN CUSTOM WL (TRAY / TRAY PROCEDURE) ×2 IMPLANT
PAD POSITIONING PINK XL (MISCELLANEOUS) ×2 IMPLANT
PORT ACCESS TROCAR AIRSEAL 12 (TROCAR) IMPLANT
SCISSORS MNPLR CVD DVNC XI (INSTRUMENTS) ×2 IMPLANT
SCRUB CHG 4% DYNA-HEX 4OZ (MISCELLANEOUS) ×2 IMPLANT
SEAL UNIV 5-12 XI (MISCELLANEOUS) ×8 IMPLANT
SET TRI-LUMEN FLTR TB AIRSEAL (TUBING) ×2 IMPLANT
SLEEVE XCEL OPT CAN 5 100 (ENDOMECHANICALS) ×1 IMPLANT
SPIKE FLUID TRANSFER (MISCELLANEOUS) ×2 IMPLANT
SPONGE T-LAP 18X18 ~~LOC~~+RFID (SPONGE) IMPLANT
SURGIFLO W/THROMBIN 8M KIT (HEMOSTASIS) IMPLANT
SUT MNCRL AB 4-0 PS2 18 (SUTURE) IMPLANT
SUT PDS AB 1 TP1 96 (SUTURE) IMPLANT
SUT STRATA PDS 0 30 CT-2.5 (SUTURE) ×1 IMPLANT
SUT V-LOC 180 0-0 GS22 (SUTURE) IMPLANT
SUT VIC AB 0 CT1 27 (SUTURE)
SUT VIC AB 0 CT1 27XBRD ANTBC (SUTURE) IMPLANT
SUT VIC AB 2-0 CT1 27 (SUTURE)
SUT VIC AB 2-0 CT1 TAPERPNT 27 (SUTURE) IMPLANT
SUT VIC AB 4-0 PS2 18 (SUTURE) ×4 IMPLANT
SUT VICRYL 0 27 CT2 27 ABS (SUTURE) ×2 IMPLANT
SUT VLOC 180 0 9IN GS21 (SUTURE) IMPLANT
SYR 10ML LL (SYRINGE) IMPLANT
SYS BAG RETRIEVAL 10MM (BASKET)
SYS WOUND ALEXIS 18CM MED (MISCELLANEOUS)
SYSTEM BAG RETRIEVAL 10MM (BASKET) IMPLANT
SYSTEM WOUND ALEXIS 18CM MED (MISCELLANEOUS) IMPLANT
TOWEL OR NON WOVEN STRL DISP B (DISPOSABLE) IMPLANT
TRAP SPECIMEN MUCUS 40CC (MISCELLANEOUS) IMPLANT
TRAY FOLEY MTR SLVR 16FR STAT (SET/KITS/TRAYS/PACK) ×2 IMPLANT
TROCAR PORT AIRSEAL 5X120 (TROCAR) ×1 IMPLANT
UNDERPAD 30X36 HEAVY ABSORB (UNDERPADS AND DIAPERS) ×4 IMPLANT
WATER STERILE IRR 1000ML POUR (IV SOLUTION) ×2 IMPLANT
YANKAUER SUCT BULB TIP 10FT TU (MISCELLANEOUS) IMPLANT

## 2023-08-21 NOTE — Interval H&P Note (Signed)
History and Physical Interval Note:  08/21/2023 7:05 AM  Samantha Wang  has presented today for surgery, with the diagnosis of ENDOMETRIAL CANCER.  The various methods of treatment have been discussed with the patient and family. After consideration of risks, benefits and other options for treatment, the patient has consented to  Procedure(s): XI ROBOTIC ASSISTED TOTAL HYSTERECTOMY WITH BILATERAL SALPINGO OOPHORECTOMY (N/A) SENTINEL NODE BIOPSY (N/A) POSSIBLE LYMPH NODE DISSECTION (N/A) POSSIBLE LAPAROTOMY (N/A) as a surgical intervention.  The patient's history has been reviewed, patient examined, no change in status, stable for surgery.  I have reviewed the patient's chart and labs.  Questions were answered to the patient's satisfaction.     Carver Fila

## 2023-08-21 NOTE — Anesthesia Postprocedure Evaluation (Signed)
Anesthesia Post Note  Patient: Samantha Wang  Procedure(s) Performed: XI ROBOTIC ASSISTED TOTAL HYSTERECTOMY WITH BILATERAL SALPINGO OOPHORECTOMY, LYSIS OF ADHESIONS (Abdomen) SENTINEL NODE BIOPSY (Abdomen)     Patient location during evaluation: PACU Anesthesia Type: General Level of consciousness: sedated and patient cooperative Pain management: pain level controlled Vital Signs Assessment: post-procedure vital signs reviewed and stable Respiratory status: spontaneous breathing Cardiovascular status: stable Anesthetic complications: no   No notable events documented.  Last Vitals:  Vitals:   08/21/23 1200 08/21/23 1224  BP: 136/77 110/83  Pulse: 76 77  Resp: 12   Temp: (!) 36.3 C   SpO2: 97% 99%    Last Pain:  Vitals:   08/21/23 1224  TempSrc:   PainSc: 2                  Lewie Loron

## 2023-08-21 NOTE — Discharge Instructions (Addendum)
AFTER SURGERY INSTRUCTIONS   Return to work: 4-6 weeks if applicable   Activity: 1. Be up and out of the bed during the day.  Take a nap if needed.  You may walk up steps but be careful and use the hand rail.  Stair climbing will tire you more than you think, you may need to stop part way and rest.    2. No lifting or straining for 6 weeks over 10 pounds. No pushing, pulling, straining for 6 weeks.   3. No driving for around 1 week(s).  Do not drive if you are taking narcotic pain medicine and make sure that your reaction time has returned.    4. You can shower as soon as the next day after surgery. Shower daily.  Use your regular soap and water (not directly on the incision) and pat your incision(s) dry afterwards; don't rub.  No tub baths or submerging your body in water until cleared by your surgeon. If you have the soap that was given to you by pre-surgical testing that was used before surgery, you do not need to use it afterwards because this can irritate your incisions.    5. No sexual activity and nothing in the vagina for 10 weeks.   6. You may experience a small amount of clear drainage from your incisions, which is normal.  If the drainage persists, increases, or changes color please call the office.   7. Do not use creams, lotions, or ointments such as neosporin on your incisions after surgery until advised by your surgeon because they can cause removal of the dermabond glue on your incisions.     8. You may experience vaginal spotting after surgery or when the stitches at the top of the vagina begin to dissolve.  The spotting is normal but if you experience heavy bleeding, call our office.   9. Take Tylenol first for pain if you are able to take these medication and only use narcotic pain medication for severe pain not relieved by the Tylenol.  Monitor your Tylenol intake to a max of 4,000 mg in a 24 hour period.    Diet: 1. Low sodium Heart Healthy Diet is recommended but you are  cleared to resume your normal (before surgery) diet after your procedure.   2. It is safe to use a laxative, such as Miralax or Colace, if you have difficulty moving your bowels. You have been prescribed Sennakot-S to take at bedtime every evening after surgery to keep bowel movements regular and to prevent constipation.     Wound Care: 1. Keep clean and dry.  Shower daily.   Reasons to call the Doctor: Fever - Oral temperature greater than 100.4 degrees Fahrenheit Foul-smelling vaginal discharge Difficulty urinating Nausea and vomiting Increased pain at the site of the incision that is unrelieved with pain medicine. Difficulty breathing with or without chest pain New calf pain especially if only on one side Sudden, continuing increased vaginal bleeding with or without clots.   Contacts: For questions or concerns you should contact:   Dr. Eugene Garnet at [phone]   [name], NP at [phone]   After Hours: call [phone] and have the GYN Oncologist paged/contacted (after 5 pm or on the weekends). You will speak with an after hours RN and let he or she know you have had surgery.   Messages sent via mychart are for non-urgent matters and are not responded to after hours so for urgent needs, please call the after hours  number.

## 2023-08-21 NOTE — Transfer of Care (Signed)
Immediate Anesthesia Transfer of Care Note  Patient: Samantha Wang  Procedure(s) Performed: XI ROBOTIC ASSISTED TOTAL HYSTERECTOMY WITH BILATERAL SALPINGO OOPHORECTOMY, LYSIS OF ADHESIONS (Abdomen) SENTINEL NODE BIOPSY (Abdomen)  Patient Location: PACU  Anesthesia Type:General  Level of Consciousness: drowsy  Airway & Oxygen Therapy: Patient Spontanous Breathing and Patient connected to nasal cannula oxygen  Post-op Assessment: Report given to RN and Post -op Vital signs reviewed and stable  Post vital signs: Reviewed and stable  Last Vitals:  Vitals Value Taken Time  BP 117/77 08/21/23 1015  Temp    Pulse 73 08/21/23 1016  Resp 18 08/21/23 1016  SpO2 100 % 08/21/23 1016  Vitals shown include unfiled device data.  Last Pain:  Vitals:   08/21/23 0608  TempSrc: Oral         Complications: No notable events documented.

## 2023-08-21 NOTE — Op Note (Signed)
OPERATIVE NOTE  Pre-operative Diagnosis: endometrial cancer grade 1  Post-operative Diagnosis: same, abdominal adhesions  Operation: Diagnostic laparoscopy, lysis of adhesions, robotic-assisted laparoscopic total hysterectomy with bilateral salpingoophorectomy, SLN biopsy bilaterally  Surgeon: Eugene Garnet MD  Assistant Surgeon: Antionette Char MD (an MD assistant was necessary for tissue manipulation, management of robotic instrumentation, retraction and positioning due to the complexity of the case and hospital policies).   Anesthesia: GET  Urine Output: 350 cc  Operative Findings: On EUA, small mobile uterus. On intra-abdominal entry, adhesions noted between the omentum, stomach and the anterior abdominal wall from the level of umbilicus and superiorly. Lysis of adhesions performed to mobilize the stomach from the anterior abdominal wall to above the level of the LUQ entry trocar (where liver was also noted to be some adherent to the anterior abdomen. Normal appearing small and large bowel. Uterus 8 cm, normal appearing adnexa with 2-3 cm paratubal cyst on the left. No adenopathy. Mapping successful to left external iliac, right obturator, and right presacral SLN. No ascites. No intra-abdominal or pelvic evidence of disease.   Estimated Blood Loss:  50 cc      Total IV Fluids: see I&O flowsheet         Specimens: uterus, cervix, bilateral tubes and ovaries, right obturator SLN, left external iliac SLN, right presacral SLN         Complications:  None apparent; patient tolerated the procedure well.         Disposition: PACU - hemodynamically stable.  Procedure Details  The patient was seen in the Holding Room. The risks, benefits, complications, treatment options, and expected outcomes were discussed with the patient.  The patient concurred with the proposed plan, giving informed consent.  The site of surgery properly noted/marked. The patient was identified as Samantha Wang  and the procedure verified as a Robotic-assisted hysterectomy with bilateral salpingo oophorectomy with SLN biopsy.   After induction of anesthesia, the patient was draped and prepped in the usual sterile manner. Patient was placed in supine position after anesthesia and draped and prepped in the usual sterile manner as follows: Her arms were tucked to her side with all appropriate precautions.  The patient was secured to the bed using padding and tape across her chest.  The patient was placed in the semi-lithotomy position in [place] stirrups.  The perineum and vagina were prepped with CHG. The patient's abdomen was prepped with ChloraPrep and she was draped after the prep had been allowed to dry for 3 minutes.  A Time Out was held and the above information confirmed.  The urethra was prepped with Betadine. Foley catheter was placed.  A sterile speculum was placed in the vagina.  The cervix was grasped with a single-tooth tenaculum. 2mg  total of ICG was injected into the cervical stroma at 2 and 9 o'clock with 1cc injected at a 1cm and 2mm depth (concentration 0.5mg /ml) in all locations. The cervix was dilated with Shawnie Pons dilators.  The ZUMI uterine manipulator with a medium colpotomizer ring was placed without difficulty.  A pneum occluder balloon was placed over the manipulator.  OG tube placement was confirmed and to suction.   Next, a 5 mm skin incision was made 1 cm below the subcostal margin in the midclavicular line.  The 5 mm Optiview port and scope was used for direct entry.  Opening pressure was under 10 mm CO2.  The abdomen was insufflated and the findings were noted as above.   At this point and all  points during the procedure, the patient's intra-abdominal pressure did not exceed 15 mmHg. Next, an 8 mm skin incision was made in the left mid abdomen as well as a 5 mm incision. Robotic and laparoscopic ports were placed under direct visualization respectively. Lysis of adhesions was then performed  with sharp dissection and short bursts of monopolar electrocautery to control small blood vessels to mobilize the stomach and omentum from the anterior abdominal wall at the level of the umbilicus. This dissection was carried to just above the level oft he 5 mm LUQ incision. 8 mm incision were then made just inferior to the umbilicus and two in the right mid abdomen. Robotic trocars were placed under direct visualization. The 5 mm assist trocar was exchanged for a 5 mm airseal port. All ports were placed under direct visualization.  The patient was placed in steep Trendelenburg.  Bowel was folded away into the upper abdomen.  The robot was docked in the normal manner.  The right and left peritoneum were opened parallel to the IP ligament to open the retroperitoneal spaces bilaterally. The round ligaments were transected. The SLN mapping was performed in bilateral pelvic basins. After identifying the ureters, the para rectal and paravesical spaces were opened up entirely with careful dissection below the level of the ureters bilaterally and to the depth of the uterine artery origin in order to skeletonize the uterine "web" and ensure visualization of all parametrial channels. The para-aortic basins were carefully exposed and evaluated for isolated para-aortic SLN's. Lymphatic channels were identified travelling to the following visualized sentinel lymph node's: right obturator, left external iliac, presacral SLNs. These SLN's were separated from their surrounding lymphatic tissue, removed and sent for permanent pathology.  The hysterectomy was started.  The ureter was again noted to be on the medial leaf of the broad ligament.  The peritoneum above the ureter was incised and stretched and the infundibulopelvic ligament was skeletonized, cauterized and cut.  The posterior peritoneum was taken down to the level of the KOH ring.  The anterior peritoneum was also taken down.  The bladder flap was created to the level  of the KOH ring.  The uterine artery on the right side was skeletonized, cauterized and cut in the normal manner.  A similar procedure was performed on the left.  The colpotomy was made and the uterus, cervix, bilateral ovaries and tubes were amputated and delivered through the vagina.  Pedicles were inspected and excellent hemostasis was achieved.    The colpotomy at the vaginal cuff was closed with 0 Vicryl with a figure of eight at each apex and 0 Stratafix to close the midportion of the cuff in a running manner.  Irrigation was used and excellent hemostasis was achieved. Intra-adominal pressure was decreased to 5 mm Hg with excellent hemostasis maintained. At this point in the procedure was completed.  Robotic instruments were removed under direct visulaization. The subcuticular tissue was closed with 4-0 Vicryl and the skin was closed with 4-0 Monocryl in a subcuticular manner.  Dermabond was applied.    The vagina was swabbed with minimal bleeding noted. Foley catheter was removed  All sponge, lap and needle counts were correct x  3.   The patient was transferred to the recovery room in stable condition.  Eugene Garnet, MD

## 2023-08-22 ENCOUNTER — Telehealth: Payer: Self-pay | Admitting: *Deleted

## 2023-08-22 ENCOUNTER — Other Ambulatory Visit: Payer: 59

## 2023-08-22 ENCOUNTER — Encounter: Payer: 59 | Admitting: Genetic Counselor

## 2023-08-22 ENCOUNTER — Encounter (HOSPITAL_COMMUNITY): Payer: Self-pay | Admitting: Gynecologic Oncology

## 2023-08-22 NOTE — Telephone Encounter (Signed)
Spoke with Samantha Wang this morning. She states she is eating, drinking and urinating well. She has not had a BM yet but is passing gas. She is taking senokot as prescribed and encouraged her to drink plenty of water. She denies fever or chills. Incisions are dry and intact. She rates her pain 4/10. Her pain is controlled with tylenol.    Instructed to call office with any fever, chills, purulent drainage, uncontrolled pain or any other questions or concerns. Patient verbalizes understanding.   Pt aware of post op appointments as well as the office number [phone] and after hours number [phone] to call if she has any questions or concerns

## 2023-08-27 ENCOUNTER — Other Ambulatory Visit: Payer: 59

## 2023-08-28 ENCOUNTER — Encounter: Payer: Self-pay | Admitting: Gynecologic Oncology

## 2023-08-28 ENCOUNTER — Inpatient Hospital Stay: Payer: 59 | Attending: Gynecologic Oncology | Admitting: Gynecologic Oncology

## 2023-08-28 DIAGNOSIS — Z90710 Acquired absence of both cervix and uterus: Secondary | ICD-10-CM

## 2023-08-28 DIAGNOSIS — Z7189 Other specified counseling: Secondary | ICD-10-CM

## 2023-08-28 DIAGNOSIS — Z9079 Acquired absence of other genital organ(s): Secondary | ICD-10-CM

## 2023-08-28 DIAGNOSIS — Z90722 Acquired absence of ovaries, bilateral: Secondary | ICD-10-CM

## 2023-08-28 DIAGNOSIS — C541 Malignant neoplasm of endometrium: Secondary | ICD-10-CM

## 2023-08-28 NOTE — Progress Notes (Signed)
Gynecologic Oncology Telehealth Note: Gyn-Onc  I connected with Samantha Wang on 08/28/23 at  4:45 PM EST by telephone and verified that I am speaking with the correct person using two identifiers.  I discussed the limitations, risks, security and privacy concerns of performing an evaluation and management service by telemedicine and the availability of in-person appointments. I also discussed with the patient that there may be a patient responsible charge related to this service. The patient expressed understanding and agreed to proceed.  Other persons participating in the visit and their role in the encounter: none.  Patient's location: home Provider's location: [facility]  Reason for Visit: follow-up  Treatment History: The patient presented for her annual exam in mid September.  She had spotting that she endorsed at that time in August.  Her last menstrual cycle had been in October 2023.  She also endorsed some intermittent spotting as well as postcoital spotting.  Endometrial ultrasound was performed on 10/4 and showed a uterus measuring 8.7 x 3.7 x 5.2 cm with an endometrium measuring 9 mm.  Bilateral ovaries normal in appearance without masses.  Endometrial biopsy was performed on 08/12/23 and revealed grade 1 endometrioid endometrial adenocarcinoma. MMR with loss of MLH1 and PMS2.   08/21/23: Diagnostic laparoscopy, lysis of adhesions, robotic-assisted laparoscopic total hysterectomy with bilateral salpingoophorectomy, SLN biopsy bilaterally   Interval History: Doing well. Denies much pain. Bowels moving well. Denies urinary symptoms, burning the first day after surgery (resolved). Denies vaginal bleeding.  Past Medical/Surgical History: Past Medical History:  Diagnosis Date   Allergy    Anemia    Anxiety    Breast nodule    left    Cancer (HCC)    GERD (gastroesophageal reflux disease)    H/O cesarean section    history of 2 c-sections     History of hiatal hernia    Peptic  ulcer disease    PONV (postoperative nausea and vomiting)    Reflux     Past Surgical History:  Procedure Laterality Date   BIOPSY  07/19/2017   Procedure: BIOPSY;  Surgeon: Malissa Hippo, MD;  [facility];  Service: Endoscopy;;  duodenum   CESAREAN SECTION  1610,9604   CESAREAN SECTION     x2   COLONOSCOPY N/A 07/19/2017   Procedure: COLONOSCOPY;  Surgeon: Malissa Hippo, MD;  [facility];  Service: Endoscopy;  Laterality: N/A;   ESOPHAGOGASTRODUODENOSCOPY N/A 10/11/2015   Procedure: ESOPHAGOGASTRODUODENOSCOPY (EGD);  Surgeon: Malissa Hippo, MD;  [facility];  Service: Endoscopy;  Laterality: N/A;  1230   ESOPHAGOGASTRODUODENOSCOPY N/A 06/06/2016   Procedure: ESOPHAGOGASTRODUODENOSCOPY (EGD);  Surgeon: Malissa Hippo, MD;  [facility];  Service: Endoscopy;  Laterality: N/A;  255   ESOPHAGOGASTRODUODENOSCOPY N/A 07/19/2017   Procedure: ESOPHAGOGASTRODUODENOSCOPY (EGD);  Surgeon: Malissa Hippo, MD;  [facility];  Service: Endoscopy;  Laterality: N/A;  8:25   EYE SURGERY     PARTIAL GASTRECTOMY  2017   ROBOTIC ASSISTED TOTAL HYSTERECTOMY WITH BILATERAL SALPINGO OOPHERECTOMY N/A 08/21/2023   Procedure: XI ROBOTIC ASSISTED TOTAL HYSTERECTOMY WITH BILATERAL SALPINGO OOPHORECTOMY, LYSIS OF ADHESIONS;  Surgeon: Carver Fila, MD;  Location: [facility];  Service: Gynecology;  Laterality: N/A;   SENTINEL NODE BIOPSY N/A 08/21/2023   Procedure: SENTINEL NODE BIOPSY;  Surgeon: Carver Fila, MD;  Location: [facility];  Service: Gynecology;  Laterality: N/A;   ulcer surgery  08/2016   abdominal   UPPER GASTROINTESTINAL ENDOSCOPY  Family History  Problem Relation Age of Onset   Healthy Mother    Cancer Mother        brain cancer-2018   Hyperlipidemia Father    Healthy Brother    Lung cancer Paternal Grandmother    Breast cancer Paternal Grandmother    Healthy Daughter    Healthy Son    Cancer Paternal Uncle      Social History   Socioeconomic History   Marital status: Married    Spouse name: Not on file   Number of children: Not on file   Years of education: Not on file   Highest education level: Bachelor's degree (e.g., BA, AB, BS)  Occupational History   Occupation: payroll  Tobacco Use   Smoking status: Never   Smokeless tobacco: Never  Vaping Use   Vaping status: Never Used  Substance and Sexual Activity   Alcohol use: Not Currently   Drug use: Never   Sexual activity: Yes    Partners: Male    Birth control/protection: Other-see comments    Comment: spouse-vasectomy   Other Topics Concern   Not on file  Social History Narrative   Not on file   Social Determinants of Health   Financial Resource Strain: Low Risk  (03/25/2023)   Overall Financial Resource Strain (CARDIA)    Difficulty of Paying Living Expenses: Not hard at all  Food Insecurity: No Food Insecurity (08/15/2023)   Hunger Vital Sign    Worried About Running Out of Food in the Last Year: Never true    Ran Out of Food in the Last Year: Never true  Transportation Needs: No Transportation Needs (08/15/2023)   PRAPARE - Administrator, Civil Service (Medical): No    Lack of Transportation (Non-Medical): No  Physical Activity: Sufficiently Active (03/25/2023)   Exercise Vital Sign    Days of Exercise per Week: 5 days    Minutes of Exercise per Session: 40 min  Stress: No Stress Concern Present (03/25/2023)   [employer] of Occupational Health - Occupational Stress Questionnaire    Feeling of Stress : Only a little  Social Connections: Moderately Integrated (03/25/2023)   Social Connection and Isolation Panel [NHANES]    Frequency of Communication with Friends and Family: More than three times a week    Frequency of Social Gatherings with Friends and Family: Once a week    Attends Religious Services: 1 to 4 times per year    Active Member of [employer] or Organizations: No    Attends Hospital doctor: Not on file    Marital Status: Married    Current Medications:  Current Outpatient Medications:    acetaminophen (TYLENOL) 500 MG tablet, Take 1,000 mg by mouth every 8 (eight) hours as needed for moderate pain (pain score 4-6)., Disp: , Rfl:    Calcium-Vitamin D-Vitamin K (VIACTIV CALCIUM PLUS D) 650-12.5-40 MG-MCG-MCG CHEW, Chew 1-2 each by mouth daily., Disp: , Rfl:    clonazePAM (KLONOPIN) 0.5 MG tablet, 1/2 to 1 po BID prn anxiety, Disp: 30 tablet, Rfl: 3   fluticasone (FLONASE) 50 MCG/ACT nasal spray, USE TWO SPRAY(S) IN EACH NOSTRIL ONCE DAILY AS NEEDED FOR CONGESTION --  **NEEDS  OFFICE  VISIT** (Patient taking differently: Place 1 spray into both nostrils daily.), Disp: 16 g, Rfl: 5   loratadine (CLARITIN) 10 MG tablet, Take 1 tablet by mouth daily., Disp: , Rfl:    methocarbamol (ROBAXIN) 500 MG tablet, Take 500 mg by mouth every 6 (six)  hours as needed for muscle spasms., Disp: , Rfl:    senna-docusate (SENOKOT-S) 8.6-50 MG tablet, Take 2 tablets by mouth at bedtime. For AFTER surgery, do not take if having diarrhea, Disp: 30 tablet, Rfl: 0   SUMAtriptan (IMITREX) 100 MG tablet, TAKE 1 TABLET BY MOUTH AS NEEDED FOR MIGRAINE. MAY REPEAT ONCE AFTER 2 HOURS, Disp: 10 tablet, Rfl: 5   traMADol (ULTRAM) 50 MG tablet, Take 1 tablet (50 mg total) by mouth every 6 (six) hours as needed for severe pain (pain score 7-10). For AFTER surgery only, do not take and drive, Disp: 10 tablet, Rfl: 0  Review of Symptoms: Pertinent positives as per HPI.  Physical Exam: Deferred given limitations of phone visit.  Laboratory & Radiologic Studies: A. SENTINEL LYMPH NODE, RIGHT OBTURATOR, EXCISION: One lymph node negative for metastatic carcinoma (0/1)  B. SENTINEL LYMPH NODE, LEFT EXTERNAL ILIAC, EXCISION: One lymph node negative for metastatic carcinoma (0/1)  C. SENTINEL LYMPH NODE, RIGHT PRESACRAL, EXCISION: One lymph node negative for metastatic carcinoma (0/1)  D. UTERUS, CERVIX,  BILATERAL FALLOPIAN TUBES AND OVARIES: Endometrioid adenocarcinoma, FIGO grade 1 with partial mucinous features associated with extensive endometrial intraepithelial neoplasia (EIN) Three mm myometrial invasion in a 1.9 cm myometrium (15%) Cervix, bilateral fallopian tubes and bilateral ovaries negative for tumor See oncology table and comment  ONCOLOGY TABLE: UTERUS, CARCINOMA OR CARCINOSARCOMA: Resection Procedure: Total hysterectomy with bilateral salpingo-oophorectomy and sentinel lymph node biopsies Histologic Type: Endometrioid with partial mucinous features Histologic Grade: FIGO grade 1 Myometrial Invasion:      Depth of Myometrial Invasion (mm): 3 mm      Myometrial Thickness (mm): 19 mm      Percentage of Myometrial Invasion: 15% Uterine Serosa Involvement: Not identified Cervical stromal Involvement: Not identified Extent of involvement of other tissue/organs: Not identified Peritoneal/Ascitic Fluid: Not applicable Lymphovascular Invasion: Not identified Regional Lymph Nodes:      Pelvic Lymph Nodes Examined:          3 Sentinel          0 non-sentinel          3 total      Pelvic Lymph Nodes with Metastasis: 0          Macrometastasis: (>2.0 mm): 0          Micrometastasis: (>0.2 mm and < 2.0 mm): 0          Isolated Tumor Cells (<0.2 mm): 0          Laterality of Lymph Node with Tumor: Not applicable          Extracapsular Extension: Not applicable Distant Metastasis:      Distant Site(s) Involved: Not applicable Pathologic Stage Classification (pTNM, AJCC 8th Edition): pT1a, pN0 Ancillary Studies: MMR and MSI testing are pending Representative Tumor Block: D5 and D7 Comment(s): There are foci of residual FIGO grade 1 endometrioid adenocarcinoma with partial mucinous features which focally invades 3 mm into a 1.9 cm myometrium.  There is also extensive EIN which extends into areas of adenomyosis.  Immunohistochemistry for cytokeratin AE1/AE3 performed on the  sentinel lymph nodes (parts A, B and C) is negative for metastatic carcinoma. (v4.2.0.1)   Assessment & Plan: Samantha Wang is a 52 y.o. woman with Stage IA2 grade 1 endometrioid endometrial adenocarcinoma who presents for phone follow-up. P53 WT. MMR abn on initial biopsy - needs MLH1 promoter hypermeythlation, MSI.  Doing well. Discussed continued expectations and restrictions. Reviewed pathology. She is happy with this news.  No adjuvant treatment indicated given early-stage low risk disease, does not meet HIR criteria.   I discussed the assessment and treatment plan with the patient. The patient was provided with an opportunity to ask questions and all were answered. The patient agreed with the plan and demonstrated an understanding of the instructions.   The patient was advised to call back or see an in-person evaluation if the symptoms worsen or if the condition fails to improve as anticipated.   6 minutes of total time was spent for this patient encounter, including preparation, phone counseling with the patient and coordination of care, and documentation of the encounter.   Eugene Garnet, MD  Division of Gynecologic Oncology  Department of Obstetrics and Gynecology  [facility]

## 2023-08-29 LAB — SURGICAL PATHOLOGY

## 2023-08-30 ENCOUNTER — Other Ambulatory Visit: Payer: 59

## 2023-08-30 ENCOUNTER — Encounter: Payer: 59 | Admitting: Genetic Counselor

## 2023-09-02 ENCOUNTER — Ambulatory Visit: Payer: 59

## 2023-09-02 DIAGNOSIS — M778 Other enthesopathies, not elsewhere classified: Secondary | ICD-10-CM

## 2023-09-02 DIAGNOSIS — G5761 Lesion of plantar nerve, right lower limb: Secondary | ICD-10-CM

## 2023-09-02 DIAGNOSIS — M7751 Other enthesopathy of right foot: Secondary | ICD-10-CM

## 2023-09-02 DIAGNOSIS — G5763 Lesion of plantar nerve, bilateral lower limbs: Secondary | ICD-10-CM

## 2023-09-02 NOTE — Progress Notes (Signed)
  Patient was seen, measured / scanned for custom molded foot orthotics.  Patient will benefit from CFO's as they will help provide total contact to MLA's helping to better distribute body weight across BIL feet greater reducing plantar pressure and pain and to also encourage FF and RF alignment.  Patient was scanned items to be ordered and fit when in  [employer], CFo, CFm

## 2023-09-06 LAB — MOLECULAR PATHOLOGY

## 2023-09-12 ENCOUNTER — Telehealth: Payer: Self-pay

## 2023-09-12 NOTE — Telephone Encounter (Signed)
She can return to work when she feels ready. If before 6 weeks, then she has lifting restrictions, may need more frequent breaks.

## 2023-09-12 NOTE — Telephone Encounter (Signed)
S/P Diagnostic laparoscopy, lysis of adhesions, robotic-assisted laparoscopic total hysterectomy with bilateral salpingoophorectomy, SLN biopsy bilaterally on 08/21/23 with Dr. Pricilla Holm  Ms. Mcmenamin called office stating she has an upcoming post op appointment on 12/6. 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 She would like to return on 12/2 if at all possible.   Aware Dr. Pricilla Holm is in the OR today, will return tomorrow. I will wait for Dr.Tucker's recommendation and will email a letter to her at email given. Pt voiced an understanding.

## 2023-09-13 ENCOUNTER — Encounter (HOSPITAL_COMMUNITY): Payer: Self-pay | Admitting: Hematology

## 2023-09-13 NOTE — Telephone Encounter (Signed)
Pt is aware note is ready in MyChart. She will call with any questions or concerns

## 2023-09-17 ENCOUNTER — Encounter (HOSPITAL_COMMUNITY): Payer: Self-pay | Admitting: Hematology

## 2023-09-17 ENCOUNTER — Other Ambulatory Visit: Payer: Self-pay | Admitting: Genetic Counselor

## 2023-09-17 ENCOUNTER — Inpatient Hospital Stay: Payer: 59 | Admitting: Genetic Counselor

## 2023-09-17 ENCOUNTER — Encounter: Payer: Self-pay | Admitting: Genetic Counselor

## 2023-09-17 ENCOUNTER — Inpatient Hospital Stay: Payer: 59

## 2023-09-17 DIAGNOSIS — Z803 Family history of malignant neoplasm of breast: Secondary | ICD-10-CM

## 2023-09-17 DIAGNOSIS — C541 Malignant neoplasm of endometrium: Secondary | ICD-10-CM

## 2023-09-17 DIAGNOSIS — Z808 Family history of malignant neoplasm of other organs or systems: Secondary | ICD-10-CM | POA: Diagnosis not present

## 2023-09-17 DIAGNOSIS — Z1379 Encounter for other screening for genetic and chromosomal anomalies: Secondary | ICD-10-CM

## 2023-09-17 LAB — GENETIC SCREENING ORDER

## 2023-09-17 NOTE — Progress Notes (Signed)
REFERRING PROVIDER: Eugene Garnet, MD  PRIMARY PROVIDER:  Tommie Sams, DO  PRIMARY REASON FOR VISIT:  1. Endometrial cancer (HCC)   2. Family history of glioblastoma   3. Family history of breast cancer    HISTORY OF PRESENT ILLNESS:   Ms. Labbe, a 52 y.o. female, was seen for a [place] cancer genetics consultation at the request of Dr. Pricilla Holm due to a personal and family history of cancer.  Ms. Carn presents to clinic today to discuss the possibility of a hereditary predisposition to cancer, to discuss genetic testing, and to further clarify her future cancer risks, as well as potential cancer risks for family members.   CANCER HISTORY:  In 2024, at the age of 19, Ms. Stanhope was diagnosed with endometrioid adenocarcinoma. IHC showed loss of expression of MLH1 and PMS2, BRAF was negative, MLH1 hypermethylation was present.  RISK FACTORS:  Menarche was at age 63.  First live birth at age 80.  OCP use for approximately 10 years.  Ovaries intact: no.  Uterus intact: no.  Menopausal status: postmenopausal.  HRT use: 0 years. Colonoscopy: yes;  per patient, she had a normal colonoscopy in 2024  . Mammogram within the last year: yes. Number of breast biopsies: 1 in 2013- fibrocystic changes, no malignancy  Any excessive radiation exposure in the past: no  Past Medical History:  Diagnosis Date   Allergy    Anemia    Anxiety    Breast nodule    left    Cancer (HCC)    GERD (gastroesophageal reflux disease)    H/O cesarean section    history of 2 c-sections     History of hiatal hernia    Peptic ulcer disease    PONV (postoperative nausea and vomiting)    Reflux     Past Surgical History:  Procedure Laterality Date   BIOPSY  07/19/2017   Procedure: BIOPSY;  Surgeon: Malissa Hippo, MD;  [facility];  Service: Endoscopy;;  duodenum   CESAREAN SECTION  6578,4696   CESAREAN SECTION     x2   COLONOSCOPY N/A 07/19/2017   Procedure: COLONOSCOPY;   Surgeon: Malissa Hippo, MD;  [facility];  Service: Endoscopy;  Laterality: N/A;   ESOPHAGOGASTRODUODENOSCOPY N/A 10/11/2015   Procedure: ESOPHAGOGASTRODUODENOSCOPY (EGD);  Surgeon: Malissa Hippo, MD;  [facility];  Service: Endoscopy;  Laterality: N/A;  1230   ESOPHAGOGASTRODUODENOSCOPY N/A 06/06/2016   Procedure: ESOPHAGOGASTRODUODENOSCOPY (EGD);  Surgeon: Malissa Hippo, MD;  [facility];  Service: Endoscopy;  Laterality: N/A;  255   ESOPHAGOGASTRODUODENOSCOPY N/A 07/19/2017   Procedure: ESOPHAGOGASTRODUODENOSCOPY (EGD);  Surgeon: Malissa Hippo, MD;  [facility];  Service: Endoscopy;  Laterality: N/A;  8:25   EYE SURGERY     PARTIAL GASTRECTOMY  2017   ROBOTIC ASSISTED TOTAL HYSTERECTOMY WITH BILATERAL SALPINGO OOPHERECTOMY N/A 08/21/2023   Procedure: XI ROBOTIC ASSISTED TOTAL HYSTERECTOMY WITH BILATERAL SALPINGO OOPHORECTOMY, LYSIS OF ADHESIONS;  Surgeon: Carver Fila, MD;  Location: [facility];  Service: Gynecology;  Laterality: N/A;   SENTINEL NODE BIOPSY N/A 08/21/2023   Procedure: SENTINEL NODE BIOPSY;  Surgeon: Carver Fila, MD;  Location: [facility];  Service: Gynecology;  Laterality: N/A;   ulcer surgery  08/2016   abdominal   UPPER GASTROINTESTINAL ENDOSCOPY      Social History   Socioeconomic History   Marital status: Married    Spouse name: Not on file   Number of children:  Not on file   Years of education: Not on file   Highest education level: Bachelor's degree (e.g., BA, AB, BS)  Occupational History   Occupation: payroll  Tobacco Use   Smoking status: Never   Smokeless tobacco: Never  Vaping Use   Vaping status: Never Used  Substance and Sexual Activity   Alcohol use: Not Currently   Drug use: Never   Sexual activity: Yes    Partners: Male    Birth control/protection: Other-see comments    Comment: spouse-vasectomy   Other Topics Concern   Not on file  Social History Narrative   Not on file    Social Determinants of Health   Financial Resource Strain: Low Risk  (03/25/2023)   Overall Financial Resource Strain (CARDIA)    Difficulty of Paying Living Expenses: Not hard at all  Food Insecurity: No Food Insecurity (08/15/2023)   Hunger Vital Sign    Worried About Running Out of Food in the Last Year: Never true    Ran Out of Food in the Last Year: Never true  Transportation Needs: No Transportation Needs (08/15/2023)   PRAPARE - Administrator, Civil Service (Medical): No    Lack of Transportation (Non-Medical): No  Physical Activity: Sufficiently Active (03/25/2023)   Exercise Vital Sign    Days of Exercise per Week: 5 days    Minutes of Exercise per Session: 40 min  Stress: No Stress Concern Present (03/25/2023)   [employer] of Occupational Health - Occupational Stress Questionnaire    Feeling of Stress : Only a little  Social Connections: Moderately Integrated (03/25/2023)   Social Connection and Isolation Panel [NHANES]    Frequency of Communication with Friends and Family: More than three times a week    Frequency of Social Gatherings with Friends and Family: Once a week    Attends Religious Services: 1 to 4 times per year    Active Member of [employer] or Organizations: No    Attends Engineer, structural: Not on file    Marital Status: Married     FAMILY HISTORY:  We obtained a detailed, 4-generation family history.  Significant diagnoses are listed below: Family History  Problem Relation Age of Onset   Healthy Mother    Brain cancer Mother 68       glioblastoma   Hyperlipidemia Father    Healthy Brother    Lung cancer Paternal Uncle 69 - 57       smoked   Bone cancer Paternal Uncle 25 - 79       spine   Lung cancer Paternal Grandmother 105 - 23       smoked   Breast cancer Paternal Grandmother 25 - 81   Healthy Daughter    Healthy Son    Pancreatic cancer Other 50       maternal great aunt's daughter      Ms. Plaisted's mother was  diagnosed with a glioblastoma at age 19 and died shortly after her diagnosis. She has one maternal first cousin once removed (maternal great aunt's daughter) diagnosed with pancreatic cancer at age 60, she died shortly after her diagnosis. Ms. Finnicum paternal uncle was diagnosed with lung cancer in his 40s (he smoked) and spinal cancer in his 18s, he died in his 28s. Her paternal grandmother was diagnosed with lung cancer in her 21s (she smoked) and breast cancer in her 82s, she died in her 82s. Ms. Giovannetti is unaware of previous family history of genetic testing  for hereditary cancer risks. There is no reported Ashkenazi Jewish ancestry.   GENETIC COUNSELING ASSESSMENT: Ms. Havner is a 52 y.o. female with a personal and family history of cancer which is somewhat suggestive of a hereditary predisposition to cancer. We, therefore, discussed and recommended the following at today's visit.   DISCUSSION: We discussed that 5 - 10% of cancer is hereditary, with most cases of uterine cancer associated with Lynch Syndrome.  There are other genes that can be associated with hereditary uterine cancer syndromes.  We discussed that testing is beneficial for several reasons including knowing how to follow individuals after completing their treatment, identifying whether potential treatment options would be beneficial, and understanding if other family members could be at risk for cancer and allowing them to undergo genetic testing.   We reviewed the characteristics, features and inheritance patterns of hereditary cancer syndromes. We also discussed genetic testing, including the appropriate family members to test, the process of testing, insurance coverage and turn-around-time for results. We discussed the implications of a negative, positive, carrier and/or variant of uncertain significant result.   Ms. Buol  was offered a common hereditary cancer panel (39 genes) and an expanded pan-cancer panel (76 genes). Ms.  Naeve was informed of the benefits and limitations of each panel, including that expanded pan-cancer panels contain genes that do not have clear management guidelines at this point in time.  We also discussed that as the number of genes included on a panel increases, the chances of variants of uncertain significance increases. After considering the benefits and limitations of each gene panel, Ms. Mccomber elected to have CancerNext-Expanded Panel.  The CancerNext-Expanded gene panel offered by [facility] and includes sequencing, rearrangement, and RNA analysis for the following 76 genes: AIP, ALK, APC, ATM, AXIN2, BAP1, BARD1, BMPR1A, BRCA1, BRCA2, BRIP1, CDC73, CDH1, CDK4, CDKN1B, CDKN2A, CEBPA, CHEK2, CTNNA1, DDX41, DICER1, ETV6, FH, FLCN, GATA2, LZTR1, MAX, MBD4, MEN1, MET, MLH1, MSH2, MSH3, MSH6, MUTYH, NF1, NF2, NTHL1, PALB2, PHOX2B, PMS2, POT1, PRKAR1A, PTCH1, PTEN, RAD51C, RAD51D, RB1, RET, RUNX1, SDHA, SDHAF2, SDHB, SDHC, SDHD, SMAD4, SMARCA4, SMARCB1, SMARCE1, STK11, SUFU, TMEM127, TP53, TSC1, TSC2, VHL, and WT1 (sequencing and deletion/duplication); EGFR, HOXB13, KIT, MITF, PDGFRA, POLD1, and POLE (sequencing only); EPCAM and GREM1 (deletion/duplication only).    Based on Ms. Mangieri's personal and family history of cancer, she meets medical criteria for genetic testing. Despite that she meets criteria, she may still have an out of pocket cost. We discussed that if her out of pocket cost for testing is over $100, the laboratory should contact them to discuss self-pay prices, patient pay assistance programs, if applicable, and other billing options.  PLAN: After considering the risks, benefits, and limitations, Ms. Weant provided informed consent to pursue genetic testing and the blood sample was sent to [facility] for analysis of the CancerNext-Expanded Panel. Results should be available within approximately 2-3 weeks' time, at which point they will be disclosed by telephone to Ms. Beaumier,  as will any additional recommendations warranted by these results. Ms. Gaska will receive a summary of her genetic counseling visit and a copy of her results once available. This information will also be available in Epic.   Ms. Buckman questions were answered to her satisfaction today. Our contact information was provided should additional questions or concerns arise. Thank you for the referral and allowing [place] to share in the care of your patient.   Lalla Brothers, MS, [facility] Genetic Counselor [email or web address] (P) [phone]  The patient was seen for a  total of 40 minutes in face-to-face genetic counseling.  The patient was seen alone.  Drs. Pamelia Hoit and/or Mosetta Putt were available to discuss this case as needed.   _______________________________________________________________________ For Office Staff:  Number of people involved in session: 1 Was an Intern/ student involved with case: yes, Asher Muir

## 2023-09-24 NOTE — Progress Notes (Signed)
Gynecologic Oncology Post-operative Follow Up  Reason for Visit: post-operative follow-up  Treatment History: The patient presented for her annual exam in mid September 2024.  She had spotting that she endorsed at that time in August.  Her last menstrual cycle had been in October 2023.  She also endorsed some intermittent spotting as well as postcoital spotting.  Endometrial ultrasound was performed on 10/4 and showed a uterus measuring 8.7 x 3.7 x 5.2 cm with an endometrium measuring 9 mm.  Bilateral ovaries normal in appearance without masses.  Endometrial biopsy was performed on 08/12/23 and revealed grade 1 endometrioid endometrial adenocarcinoma. MMR with loss of MLH1 and PMS2.   08/21/23: Diagnostic laparoscopy, lysis of adhesions, robotic-assisted laparoscopic total hysterectomy with bilateral salpingoophorectomy, SLN biopsy bilaterally   Interval History:   Past Medical/Surgical History: Past Medical History:  Diagnosis Date   Allergy    Anemia    Anxiety    Breast nodule    left    Cancer (HCC)    GERD (gastroesophageal reflux disease)    H/O cesarean section    history of 2 c-sections     History of hiatal hernia    Peptic ulcer disease    PONV (postoperative nausea and vomiting)    Reflux     Past Surgical History:  Procedure Laterality Date   BIOPSY  07/19/2017   Procedure: BIOPSY;  Surgeon: Malissa Hippo, MD;  [facility];  Service: Endoscopy;;  duodenum   CESAREAN SECTION  6295,[id number]   CESAREAN SECTION     x2   COLONOSCOPY N/A 07/19/2017   Procedure: COLONOSCOPY;  Surgeon: Malissa Hippo, MD;  [facility];  Service: Endoscopy;  Laterality: N/A;   ESOPHAGOGASTRODUODENOSCOPY N/A 10/11/2015   Procedure: ESOPHAGOGASTRODUODENOSCOPY (EGD);  Surgeon: Malissa Hippo, MD;  [facility];  Service: Endoscopy;  Laterality: N/A;  1230   ESOPHAGOGASTRODUODENOSCOPY N/A 06/06/2016   Procedure: ESOPHAGOGASTRODUODENOSCOPY (EGD);  Surgeon:  Malissa Hippo, MD;  [facility];  Service: Endoscopy;  Laterality: N/A;  255   ESOPHAGOGASTRODUODENOSCOPY N/A 07/19/2017   Procedure: ESOPHAGOGASTRODUODENOSCOPY (EGD);  Surgeon: Malissa Hippo, MD;  [facility];  Service: Endoscopy;  Laterality: N/A;  8:25   EYE SURGERY     PARTIAL GASTRECTOMY  2017   ROBOTIC ASSISTED TOTAL HYSTERECTOMY WITH BILATERAL SALPINGO OOPHERECTOMY N/A 08/21/2023   Procedure: XI ROBOTIC ASSISTED TOTAL HYSTERECTOMY WITH BILATERAL SALPINGO OOPHORECTOMY, LYSIS OF ADHESIONS;  Surgeon: Carver Fila, MD;  Location: [facility];  Service: Gynecology;  Laterality: N/A;   SENTINEL NODE BIOPSY N/A 08/21/2023   Procedure: SENTINEL NODE BIOPSY;  Surgeon: Carver Fila, MD;  Location: [facility];  Service: Gynecology;  Laterality: N/A;   ulcer surgery  08/2016   abdominal   UPPER GASTROINTESTINAL ENDOSCOPY      Family History  Problem Relation Age of Onset   Healthy Mother    Brain cancer Mother 30       glioblastoma   Hyperlipidemia Father    Healthy Brother    Lung cancer Paternal Uncle 63 - 39       smoked   Bone cancer Paternal Uncle 49 - 79       spine   Lung cancer Paternal Grandmother 61 - 45       smoked   Breast cancer Paternal Grandmother 87 - 14   Healthy Daughter    Healthy Son    Pancreatic cancer Other 50       maternal  great aunt's daughter    Social History   Socioeconomic History   Marital status: Married    Spouse name: Not on file   Number of children: Not on file   Years of education: Not on file   Highest education level: Bachelor's degree (e.g., BA, AB, BS)  Occupational History   Occupation: payroll  Tobacco Use   Smoking status: Never   Smokeless tobacco: Never  Vaping Use   Vaping status: Never Used  Substance and Sexual Activity   Alcohol use: Not Currently   Drug use: Never   Sexual activity: Yes    Partners: Male    Birth control/protection: Other-see comments    Comment: spouse-vasectomy    Other Topics Concern   Not on file  Social History Narrative   Not on file   Social Determinants of Health   Financial Resource Strain: Low Risk  (03/25/2023)   Overall Financial Resource Strain (CARDIA)    Difficulty of Paying Living Expenses: Not hard at all  Food Insecurity: No Food Insecurity (08/15/2023)   Hunger Vital Sign    Worried About Running Out of Food in the Last Year: Never true    Ran Out of Food in the Last Year: Never true  Transportation Needs: No Transportation Needs (08/15/2023)   PRAPARE - Administrator, Civil Service (Medical): No    Lack of Transportation (Non-Medical): No  Physical Activity: Sufficiently Active (03/25/2023)   Exercise Vital Sign    Days of Exercise per Week: 5 days    Minutes of Exercise per Session: 40 min  Stress: No Stress Concern Present (03/25/2023)   [employer] of Occupational Health - Occupational Stress Questionnaire    Feeling of Stress : Only a little  Social Connections: Moderately Integrated (03/25/2023)   Social Connection and Isolation Panel [NHANES]    Frequency of Communication with Friends and Family: More than three times a week    Frequency of Social Gatherings with Friends and Family: Once a week    Attends Religious Services: 1 to 4 times per year    Active Member of [employer] or Organizations: No    Attends Engineer, structural: Not on file    Marital Status: Married    Current Medications:  Current Outpatient Medications:    acetaminophen (TYLENOL) 500 MG tablet, Take 1,000 mg by mouth every 8 (eight) hours as needed for moderate pain (pain score 4-6)., Disp: , Rfl:    Calcium-Vitamin D-Vitamin K (VIACTIV CALCIUM PLUS D) 650-12.5-40 MG-MCG-MCG CHEW, Chew 1-2 each by mouth daily., Disp: , Rfl:    clonazePAM (KLONOPIN) 0.5 MG tablet, 1/2 to 1 po BID prn anxiety, Disp: 30 tablet, Rfl: 3   fluticasone (FLONASE) 50 MCG/ACT nasal spray, USE TWO SPRAY(S) IN EACH NOSTRIL ONCE DAILY AS NEEDED FOR  CONGESTION --  **NEEDS  OFFICE  VISIT** (Patient taking differently: Place 1 spray into both nostrils daily.), Disp: 16 g, Rfl: 5   loratadine (CLARITIN) 10 MG tablet, Take 1 tablet by mouth daily., Disp: , Rfl:    methocarbamol (ROBAXIN) 500 MG tablet, Take 500 mg by mouth every 6 (six) hours as needed for muscle spasms., Disp: , Rfl:    senna-docusate (SENOKOT-S) 8.6-50 MG tablet, Take 2 tablets by mouth at bedtime. For AFTER surgery, do not take if having diarrhea, Disp: 30 tablet, Rfl: 0   SUMAtriptan (IMITREX) 100 MG tablet, TAKE 1 TABLET BY MOUTH AS NEEDED FOR MIGRAINE. MAY REPEAT ONCE AFTER 2 HOURS, Disp: 10 tablet,  Rfl: 5  Review of Symptoms: Pertinent positives as per HPI.  Physical Exam: General: Well developed, well nourished female in no acute distress. Alert and oriented x 3.  Neck: Supple without any enlargements.  Lymph node survey: No cervical, supraclavicular, or inguinal adenopathy.  Cardiovascular: Regular rate and rhythm. S1 and S2 normal.  Lungs: Clear to auscultation bilaterally. No wheezes/crackles/rhonchi noted.  Skin: No rashes or lesions present. Back: No CVA tenderness.  Abdomen: Abdomen soft, non-tender and obese. Active bowel sounds in all quadrants. No evidence of a fluid wave or abdominal masses.  Genitourinary:    Vulva/vagina: Normal external female genitalia. No lesions.    Urethra: No lesions or masses    Vagina: Atrophic without any lesions. Speculum exam reveals cuff intact, suture visible, no blood or discharge.  Bimanual exam reveals cuff intact, no fluctuance or tenderness to palpation.   No palpable masses. No vaginal bleeding or drainage noted.  Rectal: Good tone, no masses, no cul de sac nodularity.  Extremities: No bilateral cyanosis, edema, or clubbing.    Laboratory & Radiologic Studies: A. SENTINEL LYMPH NODE, RIGHT OBTURATOR, EXCISION: One lymph node negative for metastatic carcinoma (0/1)  B. SENTINEL LYMPH NODE, LEFT EXTERNAL ILIAC,  EXCISION: One lymph node negative for metastatic carcinoma (0/1)  C. SENTINEL LYMPH NODE, RIGHT PRESACRAL, EXCISION: One lymph node negative for metastatic carcinoma (0/1)  D. UTERUS, CERVIX, BILATERAL FALLOPIAN TUBES AND OVARIES: Endometrioid adenocarcinoma, FIGO grade 1 with partial mucinous features associated with extensive endometrial intraepithelial neoplasia (EIN). Three mm myometrial invasion in a 1.9 cm myometrium (15%). Cervix, bilateral fallopian tubes and bilateral ovaries negative for tumor. See oncology table and comment  ONCOLOGY TABLE: UTERUS, CARCINOMA OR CARCINOSARCOMA: Resection Procedure: Total hysterectomy with bilateral salpingo-oophorectomy and sentinel lymph node biopsies Histologic Type: Endometrioid with partial mucinous features Histologic Grade: FIGO grade 1 Myometrial Invasion:      Depth of Myometrial Invasion (mm): 3 mm      Myometrial Thickness (mm): 19 mm      Percentage of Myometrial Invasion: 15% Uterine Serosa Involvement: Not identified Cervical stromal Involvement: Not identified Extent of involvement of other tissue/organs: Not identified Peritoneal/Ascitic Fluid: Not applicable Lymphovascular Invasion: Not identified Regional Lymph Nodes:      Pelvic Lymph Nodes Examined:          3 Sentinel          0 non-sentinel          3 total      Pelvic Lymph Nodes with Metastasis: 0          Macrometastasis: (>2.0 mm): 0          Micrometastasis: (>0.2 mm and < 2.0 mm): 0          Isolated Tumor Cells (<0.2 mm): 0          Laterality of Lymph Node with Tumor: Not applicable          Extracapsular Extension: Not applicable Distant Metastasis:      Distant Site(s) Involved: Not applicable Pathologic Stage Classification (pTNM, AJCC 8th Edition): pT1a, pN0 Ancillary Studies: MMR and MSI testing are pending Representative Tumor Block: D5 and D7 Comment(s): There are foci of residual FIGO grade 1 endometrioid adenocarcinoma with partial mucinous  features which focally invades 3 mm into a 1.9 cm myometrium.  There is also extensive EIN which extends into areas of adenomyosis.  Immunohistochemistry for cytokeratin AE1/AE3 performed on the sentinel lymph nodes (parts A, B and C) is negative for metastatic carcinoma.  Assessment & Plan: Samantha Wang is a 52 y.o. woman with Stage IA2 grade 1 endometrioid endometrial adenocarcinoma who presents for post-operative follow-up. P53 WT. ER +. MMR abn on initial biopsy, MSI high, MLH hypermethylation present. BRAF V600 mutation analysis undetectable.  Doing well. Discussed continued expectations and restrictions. Reviewed pathology. She is happy with this news. No adjuvant treatment indicated given early-stage low risk disease, does not meet HIR criteria.   I discussed the assessment and treatment plan with the patient. The patient was provided with an opportunity to ask questions and all were answered. The patient agreed with the plan and demonstrated an understanding of the instructions.   The patient was advised to call back or see an in-person evaluation if the symptoms worsen or if the condition fails to improve as anticipated.    minutes of total time was spent for this patient encounter, including preparation, counseling with the patient and coordination of care, and documentation of the encounter.  [name] NP [facility] GYN Oncology

## 2023-09-26 ENCOUNTER — Telehealth: Payer: Self-pay | Admitting: *Deleted

## 2023-09-26 NOTE — Telephone Encounter (Signed)
Spoke with Samantha Wang who called the office this morning to confirm her post op appt. With Dr. Pricilla Holm for tomorrow at 3:45. Pt is 5 weeks post op and states she started having some vaginal bleeding yesterday that started out pink and spotty after using the bathroom. This morning she reports the bleeding is brighter red, very spotty and she is wearing a panty liner. Pt reports mild cramping as well. Denies fever, chills, pain and all urinary symptoms. She has followed all activity restrictions and nothing per vagina. Pt is not having consistent bowel movements and at times had to strain. She reports taking senokot-s on and off. Advised patient to take the senokot-s daily unless she has loose stools and message would be relayed to provider and office will call back with any new recommendations.

## 2023-09-26 NOTE — Telephone Encounter (Signed)
Spoke with Ms. Weant and relayed message from provider. Reinforced good bowel regime keep taking senokot-s and add miralax as needed. Keep well hydrated. Pt is aware to call the office back today with any increased heavy bleeding and plan for exam tomorrow with Dr.Tucker. Pt verbalized understanding and thanked the office for calling.

## 2023-09-27 ENCOUNTER — Encounter: Payer: Self-pay | Admitting: *Deleted

## 2023-09-27 ENCOUNTER — Encounter: Payer: Self-pay | Admitting: Gynecologic Oncology

## 2023-09-27 ENCOUNTER — Inpatient Hospital Stay: Payer: 59 | Attending: Gynecologic Oncology | Admitting: Gynecologic Oncology

## 2023-09-27 VITALS — BP 149/94 | HR 65 | Temp 98.3°F | Resp 20 | Wt 160.6 lb

## 2023-09-27 DIAGNOSIS — Z90710 Acquired absence of both cervix and uterus: Secondary | ICD-10-CM

## 2023-09-27 DIAGNOSIS — N950 Postmenopausal bleeding: Secondary | ICD-10-CM | POA: Insufficient documentation

## 2023-09-27 DIAGNOSIS — Z90722 Acquired absence of ovaries, bilateral: Secondary | ICD-10-CM

## 2023-09-27 DIAGNOSIS — C541 Malignant neoplasm of endometrium: Secondary | ICD-10-CM

## 2023-09-27 DIAGNOSIS — Z9079 Acquired absence of other genital organ(s): Secondary | ICD-10-CM

## 2023-09-27 DIAGNOSIS — Z7189 Other specified counseling: Secondary | ICD-10-CM

## 2023-09-27 NOTE — Progress Notes (Signed)
Gynecologic Oncology Return Clinic Visit  09/27/23  Reason for Visit: follow-up   Treatment History: The patient presented for her annual exam in mid September.  She had spotting that she endorsed at that time in August.  Her last menstrual cycle had been in October 2023.  She also endorsed some intermittent spotting as well as postcoital spotting.  Endometrial ultrasound was performed on 10/4 and showed a uterus measuring 8.7 x 3.7 x 5.2 cm with an endometrium measuring 9 mm.  Bilateral ovaries normal in appearance without masses.  Endometrial biopsy was performed on 08/12/23 and revealed grade 1 endometrioid endometrial adenocarcinoma. MMR with loss of MLH1 and PMS2.    08/21/23: Diagnostic laparoscopy, lysis of adhesions, robotic-assisted laparoscopic total hysterectomy with bilateral salpingoophorectomy, SLN biopsy bilaterally   Interval History: Patient is overall doing well.  She began having some vaginal bleeding yesterday with associated cramping.  She describes this today as minimal spotting on a pad and bleeding when she wipes after voiding.  Denies cramping today.  Reports constipation, has been taking Senokot intermittently.  Denies urinary symptoms.  Past Medical/Surgical History: Past Medical History:  Diagnosis Date   Allergy    Anemia    Anxiety    Breast nodule    left    Cancer (HCC)    GERD (gastroesophageal reflux disease)    H/O cesarean section    history of 2 c-sections     History of hiatal hernia    Peptic ulcer disease    PONV (postoperative nausea and vomiting)    Reflux     Past Surgical History:  Procedure Laterality Date   BIOPSY  07/19/2017   Procedure: BIOPSY;  Surgeon: Malissa Hippo, MD;  [facility];  Service: Endoscopy;;  duodenum   CESAREAN SECTION  9147,8295   CESAREAN SECTION     x2   COLONOSCOPY N/A 07/19/2017   Procedure: COLONOSCOPY;  Surgeon: Malissa Hippo, MD;  [facility];  Service: Endoscopy;   Laterality: N/A;   ESOPHAGOGASTRODUODENOSCOPY N/A 10/11/2015   Procedure: ESOPHAGOGASTRODUODENOSCOPY (EGD);  Surgeon: Malissa Hippo, MD;  [facility];  Service: Endoscopy;  Laterality: N/A;  1230   ESOPHAGOGASTRODUODENOSCOPY N/A 06/06/2016   Procedure: ESOPHAGOGASTRODUODENOSCOPY (EGD);  Surgeon: Malissa Hippo, MD;  [facility];  Service: Endoscopy;  Laterality: N/A;  255   ESOPHAGOGASTRODUODENOSCOPY N/A 07/19/2017   Procedure: ESOPHAGOGASTRODUODENOSCOPY (EGD);  Surgeon: Malissa Hippo, MD;  [facility];  Service: Endoscopy;  Laterality: N/A;  8:25   EYE SURGERY     PARTIAL GASTRECTOMY  2017   ROBOTIC ASSISTED TOTAL HYSTERECTOMY WITH BILATERAL SALPINGO OOPHERECTOMY N/A 08/21/2023   Procedure: XI ROBOTIC ASSISTED TOTAL HYSTERECTOMY WITH BILATERAL SALPINGO OOPHORECTOMY, LYSIS OF ADHESIONS;  Surgeon: Carver Fila, MD;  Location: [facility];  Service: Gynecology;  Laterality: N/A;   SENTINEL NODE BIOPSY N/A 08/21/2023   Procedure: SENTINEL NODE BIOPSY;  Surgeon: Carver Fila, MD;  Location: [facility];  Service: Gynecology;  Laterality: N/A;   ulcer surgery  08/2016   abdominal   UPPER GASTROINTESTINAL ENDOSCOPY      Family History  Problem Relation Age of Onset   Healthy Mother    Brain cancer Mother 83       glioblastoma   Hyperlipidemia Father    Healthy Brother    Lung cancer Paternal Uncle 12 - 66       smoked   Bone cancer Paternal Uncle 25 - 74  spine   Lung cancer Paternal Grandmother 13 - 65       smoked   Breast cancer Paternal Grandmother 35 - 4   Healthy Daughter    Healthy Son    Pancreatic cancer Other 50       maternal great aunt's daughter    Social History   Socioeconomic History   Marital status: Married    Spouse name: Not on file   Number of children: Not on file   Years of education: Not on file   Highest education level: Bachelor's degree (e.g., BA, AB, BS)  Occupational History   Occupation: payroll   Tobacco Use   Smoking status: Never   Smokeless tobacco: Never  Vaping Use   Vaping status: Never Used  Substance and Sexual Activity   Alcohol use: Not Currently   Drug use: Never   Sexual activity: Yes    Partners: Male    Birth control/protection: Other-see comments    Comment: spouse-vasectomy   Other Topics Concern   Not on file  Social History Narrative   Not on file   Social Determinants of Health   Financial Resource Strain: Low Risk  (03/25/2023)   Overall Financial Resource Strain (CARDIA)    Difficulty of Paying Living Expenses: Not hard at all  Food Insecurity: No Food Insecurity (08/15/2023)   Hunger Vital Sign    Worried About Running Out of Food in the Last Year: Never true    Ran Out of Food in the Last Year: Never true  Transportation Needs: No Transportation Needs (08/15/2023)   PRAPARE - Administrator, Civil Service (Medical): No    Lack of Transportation (Non-Medical): No  Physical Activity: Sufficiently Active (03/25/2023)   Exercise Vital Sign    Days of Exercise per Week: 5 days    Minutes of Exercise per Session: 40 min  Stress: No Stress Concern Present (03/25/2023)   [employer] of Occupational Health - Occupational Stress Questionnaire    Feeling of Stress : Only a little  Social Connections: Moderately Integrated (03/25/2023)   Social Connection and Isolation Panel [NHANES]    Frequency of Communication with Friends and Family: More than three times a week    Frequency of Social Gatherings with Friends and Family: Once a week    Attends Religious Services: 1 to 4 times per year    Active Member of [employer] or Organizations: No    Attends Engineer, structural: Not on file    Marital Status: Married    Current Medications:  Current Outpatient Medications:    acetaminophen (TYLENOL) 500 MG tablet, Take 1,000 mg by mouth every 8 (eight) hours as needed for moderate pain (pain score 4-6)., Disp: , Rfl:    Calcium-Vitamin  D-Vitamin K (VIACTIV CALCIUM PLUS D) 650-12.5-40 MG-MCG-MCG CHEW, Chew 1-2 each by mouth daily., Disp: , Rfl:    clonazePAM (KLONOPIN) 0.5 MG tablet, 1/2 to 1 po BID prn anxiety, Disp: 30 tablet, Rfl: 3   fluticasone (FLONASE) 50 MCG/ACT nasal spray, USE TWO SPRAY(S) IN EACH NOSTRIL ONCE DAILY AS NEEDED FOR CONGESTION --  **NEEDS  OFFICE  VISIT** (Patient taking differently: Place 1 spray into both nostrils daily.), Disp: 16 g, Rfl: 5   loratadine (CLARITIN) 10 MG tablet, Take 1 tablet by mouth daily., Disp: , Rfl:    methocarbamol (ROBAXIN) 500 MG tablet, Take 500 mg by mouth every 6 (six) hours as needed for muscle spasms., Disp: , Rfl:    senna-docusate (SENOKOT-S)  8.6-50 MG tablet, Take 2 tablets by mouth at bedtime. For AFTER surgery, do not take if having diarrhea, Disp: 30 tablet, Rfl: 0   SUMAtriptan (IMITREX) 100 MG tablet, TAKE 1 TABLET BY MOUTH AS NEEDED FOR MIGRAINE. MAY REPEAT ONCE AFTER 2 HOURS, Disp: 10 tablet, Rfl: 5  Review of Systems: Denies appetite changes, fevers, chills, fatigue, unexplained weight changes. Denies hearing loss, neck lumps or masses, mouth sores, ringing in ears or voice changes. Denies cough or wheezing.  Denies shortness of breath. Denies chest pain or palpitations. Denies leg swelling. Denies abdominal distention, pain, blood in stools, diarrhea, nausea, vomiting, or early satiety. Denies pain with intercourse, dysuria, frequency, hematuria or incontinence. Denies hot flashes, pelvic pain, vaginal discharge.   Denies joint pain, back pain or muscle pain/cramps. Denies itching, rash, or wounds. Denies dizziness, headaches, numbness or seizures. Denies swollen lymph nodes or glands, denies easy bruising or bleeding. Denies anxiety, depression, confusion, or decreased concentration.  Physical Exam: BP (!) 149/94 (BP Location: Right Arm, Patient Position: Sitting) Comment: Notified RN  Pulse 65   Temp 98.3 F (36.8 C) (Oral)   Resp 20   Wt 160 lb 9.6  oz (72.8 kg)   SpO2 100%   BMI 29.37 kg/m  General: Alert, oriented, no acute distress. HEENT: Normocephalic, atraumatic, sclera anicteric. Chest: Unlabored breathing on room air. Abdomen:  soft, nontender.  Normoactive bowel sounds.  No masses or hepatosplenomegaly appreciated.  Well-healed incisions. Extremities: Grossly normal range of motion.  Warm, well perfused.  No edema bilaterally. GU: Normal appearing external genitalia without erythema, excoriation, or lesions.  Speculum exam reveals intact, suture visible, small amount of blood at the top of the vagina although no active bleeding.  Silver nitrate used to treat several areas of the vaginal cuff.  Bimanual exam reveals intact, no fluctuance or tenderness to palpation.    Laboratory & Radiologic Studies: None new  Assessment & Plan: Samantha Wang is a 52 y.o. woman  with Stage IA2 grade 1 endometrioid endometrial adenocarcinoma who presents for phone follow-up. P53 WT. MMR abn on initial biopsy - needs MLH1 promoter hypermeythlation, MSI.   Doing well. Discussed continued expectations and restrictions.   Reviewed pathology.  Given a copy of her pathology report.  Given low risk, early stage disease, no adjuvant treatment indicated.  We discussed mismatch repair testing and MSI status.  MLH1 promoter hyper methylation was present indicating likely sporadic tumor.  She is already met with genetic counselors and decided to pursue genetic testing.  Patient given precautions about any further bleeding.  Per [facility]CCN surveillance recommendations, we will plan on surveillance visits every 6 months alternating between my office and her OB/GYN office.  I will see her for follow-up in 6 months.  She was given a list of symptoms that should prompt a phone call before her next visit.  20 minutes of total time was spent for this patient encounter, including preparation, face-to-face counseling with the patient and coordination of care, and  documentation of the encounter.  Eugene Garnet, MD  Division of Gynecologic Oncology  Department of Obstetrics and Gynecology  [facility]

## 2023-09-27 NOTE — Patient Instructions (Signed)
It was good to see you.  You are healing well from surgery.  Please remember, no heavy lifting for 6 weeks after surgery and nothing in the vagina for at least 10-12.  I will see you for follow-up in 6 months.  If you develop vaginal bleeding, pelvic pain, change to bowel function, or unintentional weight loss between visits, please call to see me sooner.

## 2023-09-30 ENCOUNTER — Telehealth: Payer: Self-pay | Admitting: *Deleted

## 2023-09-30 NOTE — Telephone Encounter (Signed)
Spoke with Samantha Wang who called the office stating after her appointment on Friday with Dr. Pricilla Holm the vaginal spotting/bleeding had stopped. Patient calling this morning stating the vaginal spotting started back last night with mild cramping. The bleeding is bright red and spotty. She is wearing a panty liner only. Denies fever, chills, pain and all urinary symptoms. Pt is following all activity restrictions and denies any activity prior to the bleeding. Advised patient her message would be relayed to providers.

## 2023-09-30 NOTE — Telephone Encounter (Signed)
Spoke with patient and appointment made with [name], NP for Tuesday, December 10 th at 0930 with check in time of 0915. Pt agreed to date and time of appointment.

## 2023-10-01 ENCOUNTER — Inpatient Hospital Stay (HOSPITAL_BASED_OUTPATIENT_CLINIC_OR_DEPARTMENT_OTHER): Payer: 59 | Admitting: Gynecologic Oncology

## 2023-10-01 VITALS — BP 126/87 | HR 81 | Temp 98.3°F | Resp 16 | Ht 62.0 in | Wt 160.0 lb

## 2023-10-01 DIAGNOSIS — Z9079 Acquired absence of other genital organ(s): Secondary | ICD-10-CM

## 2023-10-01 DIAGNOSIS — N950 Postmenopausal bleeding: Secondary | ICD-10-CM | POA: Diagnosis not present

## 2023-10-01 DIAGNOSIS — Z90710 Acquired absence of both cervix and uterus: Secondary | ICD-10-CM | POA: Diagnosis not present

## 2023-10-01 DIAGNOSIS — Z90722 Acquired absence of ovaries, bilateral: Secondary | ICD-10-CM | POA: Diagnosis not present

## 2023-10-01 DIAGNOSIS — C541 Malignant neoplasm of endometrium: Secondary | ICD-10-CM | POA: Diagnosis present

## 2023-10-01 DIAGNOSIS — N939 Abnormal uterine and vaginal bleeding, unspecified: Secondary | ICD-10-CM

## 2023-10-01 NOTE — Patient Instructions (Addendum)
It was good to see you.  You are healing well from surgery.    We used some additional silver nitrate at the vaginal cuff. No active sites of bleeding on exam today. Please monitor the bleeding and call with any changes, increase in amount.  Please remember, no heavy lifting for 6 weeks after surgery and nothing in the vagina for at least 10-12.   I will see you for follow-up in 6 months.  If you develop vaginal bleeding, pelvic pain, change to bowel function, or unintentional weight loss between visits, please call to see me sooner.

## 2023-10-01 NOTE — Progress Notes (Signed)
Gynecologic Oncology Return Clinic Visit  10/01/23  Reason for Visit: evaluation of vaginal bleeding   Treatment History: The patient presented for her annual exam in mid September.  She had spotting that she endorsed at that time in August.  Her last menstrual cycle had been in October 2023.  She also endorsed some intermittent spotting as well as postcoital spotting.  Endometrial ultrasound was performed on 10/4 and showed a uterus measuring 8.7 x 3.7 x 5.2 cm with an endometrium measuring 9 mm.  Bilateral ovaries normal in appearance without masses.  Endometrial biopsy was performed on 08/12/23 and revealed grade 1 endometrioid endometrial adenocarcinoma. MMR with loss of MLH1 and PMS2.    08/21/23: Diagnostic laparoscopy, lysis of adhesions, robotic-assisted laparoscopic total hysterectomy with bilateral salpingoophorectomy, SLN biopsy bilaterally   Interval History: Patient has been doing well since her visit this past Friday. She states after the exam with Dr. Pricilla Holm and application of silver nitrate, the vaginal spotting had stopped. She had no spotting over the weekend. Starting Monday, she noted bright red vaginal bleeding, the amount prompting need to change her pad. She also have a "quivering" sensation/mild cramping with the bleeding in her lower pelvis. Doing well otherwise. Tolerating diet. Voiding without difficulty. Bowels functioning. No other symptoms voiced.  Past Medical/Surgical History: Past Medical History:  Diagnosis Date   Allergy    Anemia    Anxiety    Breast nodule    left    Cancer (HCC)    GERD (gastroesophageal reflux disease)    H/O cesarean section    history of 2 c-sections     History of hiatal hernia    Peptic ulcer disease    PONV (postoperative nausea and vomiting)    Reflux     Past Surgical History:  Procedure Laterality Date   BIOPSY  07/19/2017   Procedure: BIOPSY;  Surgeon: Malissa Hippo, MD;  [facility];  Service:  Endoscopy;;  duodenum   CESAREAN SECTION  1610,9604   CESAREAN SECTION     x2   COLONOSCOPY N/A 07/19/2017   Procedure: COLONOSCOPY;  Surgeon: Malissa Hippo, MD;  [facility];  Service: Endoscopy;  Laterality: N/A;   ESOPHAGOGASTRODUODENOSCOPY N/A 10/11/2015   Procedure: ESOPHAGOGASTRODUODENOSCOPY (EGD);  Surgeon: Malissa Hippo, MD;  [facility];  Service: Endoscopy;  Laterality: N/A;  1230   ESOPHAGOGASTRODUODENOSCOPY N/A 06/06/2016   Procedure: ESOPHAGOGASTRODUODENOSCOPY (EGD);  Surgeon: Malissa Hippo, MD;  [facility];  Service: Endoscopy;  Laterality: N/A;  255   ESOPHAGOGASTRODUODENOSCOPY N/A 07/19/2017   Procedure: ESOPHAGOGASTRODUODENOSCOPY (EGD);  Surgeon: Malissa Hippo, MD;  [facility]: [facility];  Service: Endoscopy;  Laterality: N/A;  8:25   EYE SURGERY     PARTIAL GASTRECTOMY  2017   ROBOTIC ASSISTED TOTAL HYSTERECTOMY WITH BILATERAL SALPINGO OOPHERECTOMY N/A 08/21/2023   Procedure: XI ROBOTIC ASSISTED TOTAL HYSTERECTOMY WITH BILATERAL SALPINGO OOPHORECTOMY, LYSIS OF ADHESIONS;  Surgeon: Carver Fila, MD;  Location: [facility];  Service: Gynecology;  Laterality: N/A;   SENTINEL NODE BIOPSY N/A 08/21/2023   Procedure: SENTINEL NODE BIOPSY;  Surgeon: Carver Fila, MD;  Location: [facility];  Service: Gynecology;  Laterality: N/A;   ulcer surgery  08/2016   abdominal   UPPER GASTROINTESTINAL ENDOSCOPY      Family History  Problem Relation Age of Onset   Healthy Mother    Brain cancer Mother 56       glioblastoma   Hyperlipidemia Father  Healthy Brother    Lung cancer Paternal Uncle 64 - 56       smoked   Bone cancer Paternal Uncle 35 - 79       spine   Lung cancer Paternal Grandmother 26 - 5       smoked   Breast cancer Paternal Grandmother 62 - 62   Healthy Daughter    Healthy Son    Pancreatic cancer Other 50       maternal great aunt's daughter    Social History   Socioeconomic History   Marital status:  Married    Spouse name: Not on file   Number of children: Not on file   Years of education: Not on file   Highest education level: Bachelor's degree (e.g., BA, AB, BS)  Occupational History   Occupation: payroll  Tobacco Use   Smoking status: Never   Smokeless tobacco: Never  Vaping Use   Vaping status: Never Used  Substance and Sexual Activity   Alcohol use: Not Currently   Drug use: Never   Sexual activity: Yes    Partners: Male    Birth control/protection: Other-see comments    Comment: spouse-vasectomy   Other Topics Concern   Not on file  Social History Narrative   Not on file   Social Determinants of Health   Financial Resource Strain: Low Risk  (03/25/2023)   Overall Financial Resource Strain (CARDIA)    Difficulty of Paying Living Expenses: Not hard at all  Food Insecurity: No Food Insecurity (08/15/2023)   Hunger Vital Sign    Worried About Running Out of Food in the Last Year: Never true    Ran Out of Food in the Last Year: Never true  Transportation Needs: No Transportation Needs (08/15/2023)   PRAPARE - Administrator, Civil Service (Medical): No    Lack of Transportation (Non-Medical): No  Physical Activity: Sufficiently Active (03/25/2023)   Exercise Vital Sign    Days of Exercise per Week: 5 days    Minutes of Exercise per Session: 40 min  Stress: No Stress Concern Present (03/25/2023)   [employer] of Occupational Health - Occupational Stress Questionnaire    Feeling of Stress : Only a little  Social Connections: Moderately Integrated (03/25/2023)   Social Connection and Isolation Panel [NHANES]    Frequency of Communication with Friends and Family: More than three times a week    Frequency of Social Gatherings with Friends and Family: Once a week    Attends Religious Services: 1 to 4 times per year    Active Member of [employer] or Organizations: No    Attends Engineer, structural: Not on file    Marital Status: Married    Current  Medications:  Current Outpatient Medications:    acetaminophen (TYLENOL) 500 MG tablet, Take 1,000 mg by mouth every 8 (eight) hours as needed for moderate pain (pain score 4-6)., Disp: , Rfl:    Calcium-Vitamin D-Vitamin K (VIACTIV CALCIUM PLUS D) 650-12.5-40 MG-MCG-MCG CHEW, Chew 1-2 each by mouth daily., Disp: , Rfl:    clonazePAM (KLONOPIN) 0.5 MG tablet, 1/2 to 1 po BID prn anxiety, Disp: 30 tablet, Rfl: 3   fluticasone (FLONASE) 50 MCG/ACT nasal spray, USE TWO SPRAY(S) IN EACH NOSTRIL ONCE DAILY AS NEEDED FOR CONGESTION --  **NEEDS  OFFICE  VISIT** (Patient taking differently: Place 1 spray into both nostrils daily.), Disp: 16 g, Rfl: 5   loratadine (CLARITIN) 10 MG tablet, Take 1 tablet by mouth  daily., Disp: , Rfl:    methocarbamol (ROBAXIN) 500 MG tablet, Take 500 mg by mouth every 6 (six) hours as needed for muscle spasms., Disp: , Rfl:    senna-docusate (SENOKOT-S) 8.6-50 MG tablet, Take 2 tablets by mouth at bedtime. For AFTER surgery, do not take if having diarrhea, Disp: 30 tablet, Rfl: 0   SUMAtriptan (IMITREX) 100 MG tablet, TAKE 1 TABLET BY MOUTH AS NEEDED FOR MIGRAINE. MAY REPEAT ONCE AFTER 2 HOURS, Disp: 10 tablet, Rfl: 5  Review of Systems: Intake ROS form: positive for vaginal bleeding Denies appetite changes, fevers, chills, fatigue, unexplained weight changes. Denies hearing loss, neck lumps or masses, mouth sores, ringing in ears or voice changes. Denies cough or wheezing.  Denies shortness of breath. Denies chest pain or palpitations. Denies leg swelling. Denies abdominal distention, pain, blood in stools, diarrhea, nausea, vomiting, or early satiety. Denies dysuria, frequency, hematuria or incontinence. Denies hot flashes, pelvic pain, vaginal discharge.   Denies joint pain, back pain or muscle pain/cramps. Denies itching, rash, or wounds. Denies dizziness, headaches, numbness or seizures. Denies swollen lymph nodes or glands, denies easy bruising or  bleeding. Denies anxiety, depression, confusion, or decreased concentration.  Physical Exam: BP 126/87 (BP Location: Left Arm, Patient Position: Sitting)   Pulse 81   Temp 98.3 F (36.8 C) (Oral)   Resp 16   Ht 5\' 2"  (1.575 m)   Wt 160 lb (72.6 kg)   SpO2 100%   BMI 29.26 kg/m  General: Alert, oriented, no acute distress. HEENT: Normocephalic, atraumatic, sclera anicteric. Chest: Lungs clear. Unlabored breathing on room air. Heart regular in rate and rhythm. Abdomen:  soft, nontender. Well-healed incisions. Superficial, protruding suture removed from incision at the umbilicus without difficulty.  Extremities: Grossly normal range of motion.  Warm, well perfused.  No edema bilaterally. GU: Normal appearing external genitalia without erythema, excoriation, or lesions.  Speculum exam reveals intact, suture visible, whitish-yellow discharge related to recent silver nitrate application, no active bleeding.  Silver nitrate again used to treat three raw areas of the vaginal cuff.      Laboratory & Radiologic Studies: None new  Assessment & Plan: Samantha Wang is a 52 y.o. woman  with Stage IA2 grade 1 endometrioid endometrial adenocarcinoma who presents for phone follow-up. P53 WT. MMR abn on initial biopsy - needs MLH1 promoter hypermeythlation, MSI.   Doing well. No active bleeding noted on today's examination. Discussed continued expectations and restrictions. Patient given precautions about any further bleeding.  Per NCCN surveillance recommendations, we will plan on surveillance visits every 6 months alternating between our office and her OB/GYN office. She will see Dr. Pricilla Holm for follow-up in 6 months.   20 minutes of total time was spent for this patient encounter, including preparation, face-to-face counseling with the patient and coordination of care, and documentation of the encounter.  [name], NP [facility] On[facility]

## 2023-10-03 ENCOUNTER — Telehealth: Payer: Self-pay | Admitting: *Deleted

## 2023-10-03 NOTE — Telephone Encounter (Signed)
Spoke with Samantha Wang and she states she is no longer having any vaginal discharge or spotting. Advised patient to call with any concerns or questions. Pt thanked the office for calling.

## 2023-10-10 ENCOUNTER — Telehealth: Payer: Self-pay

## 2023-10-10 NOTE — Telephone Encounter (Signed)
S/P Diagnostic laparoscopy, lysis of adhesions, robotic-assisted laparoscopic total hysterectomy with bilateral salpingoophorectomy, SLN biopsy bilaterally on 08/21/23 with Dr. Pricilla Holm.   Pt called office stating she has been seen several times with vaginal spotting. Last visit with [name] on 12/10 with silver nitrate being used on vaginal cuff.  This week she has had very light spotting mainly on toilet tissue when wipes and sometimes has been on pantyliner. Reports no cramping, no discharge except the brown from the silver nitrate, she has noticed a small odor but that is nothing new for her. Reports no heavy lifting or straining. Reports has not had intercourse and nothing in the vagina.   Pt aware Dr.Tucker and [name] NP is in the OR today, message sent and will call back with advice.

## 2023-10-10 NOTE — Telephone Encounter (Signed)
Per [name] NP pt is aware to monitor and call office with heavy bleeding.

## 2023-10-11 ENCOUNTER — Ambulatory Visit: Payer: 59

## 2023-10-11 NOTE — Progress Notes (Signed)
Patient presents today to pick up custom molded foot orthotics, diagnosed with Morton's Neuroma by Dr. Lilian Kapur.   Orthotics were dispensed and fit was satisfactory. Reviewed instructions for break-in and wear. Written instructions given to patient.  Patient will follow up as needed.  Samantha Wang Cped, CFo, CFm

## 2023-10-22 ENCOUNTER — Encounter: Payer: Self-pay | Admitting: Genetic Counselor

## 2023-10-22 ENCOUNTER — Telehealth: Payer: Self-pay | Admitting: Genetic Counselor

## 2023-10-22 ENCOUNTER — Ambulatory Visit: Payer: Self-pay | Admitting: Genetic Counselor

## 2023-10-22 DIAGNOSIS — Z1379 Encounter for other screening for genetic and chromosomal anomalies: Secondary | ICD-10-CM | POA: Insufficient documentation

## 2023-10-22 NOTE — Telephone Encounter (Signed)
 I contacted Ms. Mcclanahan to discuss her genetic testing results. No pathogenic variants were identified in the 76 genes analyzed. Of note, a variant of uncertain significance was identified in the POLE gene. Detailed clinic note to follow.  The test report has been scanned into EPIC and is located under the Molecular Pathology section of the Results Review tab.  A portion of the result report is included below for reference.   Filemon Breton, MS, [facility] Genetic Counselor [email or web address] (P) [phone]

## 2023-10-22 NOTE — Progress Notes (Signed)
 HPI:   Samantha Wang was previously seen in the [facility] Genetics clinic due to a personal and family history of cancer and concerns regarding a hereditary predisposition to cancer. Please refer to our prior cancer genetics clinic note for more information regarding our discussion, assessment and recommendations, at the time. Samantha Wang recent genetic test results were disclosed to her, as were recommendations warranted by these results. These results and recommendations are discussed in more detail below.  CANCER HISTORY:  Oncology History  Endometrial cancer (HCC)  08/21/2023 Initial Diagnosis   Endometrial cancer (HCC)    Genetic Testing   [facility] CancerNext-Expanded Panel+RNA was Negative. Of note, a variant of uncertain significance was detected in the POLE gene (p.P1481S). Report date is 10/22/2023.   The CancerNext-Expanded gene panel offered by [facility] and includes sequencing, rearrangement, and RNA analysis for the following 76 genes: AIP, ALK, APC, ATM, AXIN2, BAP1, BARD1, BMPR1A, BRCA1, BRCA2, BRIP1, CDC73, CDH1, CDK4, CDKN1B, CDKN2A, CEBPA, CHEK2, CTNNA1, DDX41, DICER1, ETV6, FH, FLCN, GATA2, LZTR1, MAX, MBD4, MEN1, MET, MLH1, MSH2, MSH3, MSH6, MUTYH, NF1, NF2, NTHL1, PALB2, PHOX2B, PMS2, POT1, PRKAR1A, PTCH1, PTEN, RAD51C, RAD51D, RB1, RET, RUNX1, SDHA, SDHAF2, SDHB, SDHC, SDHD, SMAD4, SMARCA4, SMARCB1, SMARCE1, STK11, SUFU, TMEM127, TP53, TSC1, TSC2, VHL, and WT1 (sequencing and deletion/duplication); EGFR, HOXB13, KIT, MITF, PDGFRA, POLD1, and POLE (sequencing only); EPCAM and GREM1 (deletion/duplication only).       FAMILY HISTORY:  We obtained a detailed, 4-generation family history.  Significant diagnoses are listed below:      Family History  Problem Relation Age of Onset   Healthy Mother     Brain cancer Mother 12        glioblastoma   Hyperlipidemia Father     Healthy Brother     Lung cancer Paternal Uncle 67 - 49        smoked   Bone cancer Paternal  Uncle 84 - 79        spine   Lung cancer Paternal Grandmother 74 - 9        smoked   Breast cancer Paternal Grandmother 71 - 50   Healthy Daughter     Healthy Son     Pancreatic cancer Other 50        maternal great aunt's daughter             Samantha Wang mother was diagnosed with a glioblastoma at age 18 and died shortly after her diagnosis. She has one maternal first cousin once removed (maternal great aunt's daughter) diagnosed with pancreatic cancer at age 37, she died shortly after her diagnosis. Samantha Wang paternal uncle was diagnosed with lung cancer in his 75s (he smoked) and spinal cancer in his 24s, he died in his 70s. Her paternal grandmother was diagnosed with lung cancer in her 8s (she smoked) and breast cancer in her 30s, she died in her 27s. Samantha Wang is unaware of previous family history of genetic testing for hereditary cancer risks. There is no reported Ashkenazi Jewish ancestry.    GENETIC TEST RESULTS:  The [facility] CancerNext-Expanded Panel found no pathogenic mutations.   The CancerNext-Expanded gene panel offered by [facility] and includes sequencing, rearrangement, and RNA analysis for the following 76 genes: AIP, ALK, APC, ATM, AXIN2, BAP1, BARD1, BMPR1A, BRCA1, BRCA2, BRIP1, CDC73, CDH1, CDK4, CDKN1B, CDKN2A, CEBPA, CHEK2, CTNNA1, DDX41, DICER1, ETV6, FH, FLCN, GATA2, LZTR1, MAX, MBD4, MEN1, MET, MLH1, MSH2, MSH3, MSH6, MUTYH, NF1, NF2, NTHL1, PALB2, PHOX2B, PMS2, POT1, PRKAR1A, PTCH1,  PTEN, RAD51C, RAD51D, RB1, RET, RUNX1, SDHA, SDHAF2, SDHB, SDHC, SDHD, SMAD4, SMARCA4, SMARCB1, SMARCE1, STK11, SUFU, TMEM127, TP53, TSC1, TSC2, VHL, and WT1 (sequencing and deletion/duplication); EGFR, HOXB13, KIT, MITF, PDGFRA, POLD1, and POLE (sequencing only); EPCAM and GREM1 (deletion/duplication only).     The test report has been scanned into EPIC and is located under the Molecular Pathology section of the Results Review tab.  A portion of the result report is included  below for reference. Genetic testing reported out on 10/22/2023.       Genetic testing identified a variant of uncertain significance (VUS) in the POLE gene called p.P1481S.  At this time, it is unknown if this variant is associated with an increased risk for cancer or if it is benign, but most uncertain variants are reclassified to benign. It should not be used to make medical management decisions. With time, we suspect the laboratory will determine the significance of this variant, if any. If the laboratory reclassifies this variant, we will attempt to contact Samantha Wang to discuss it further.   Even though a pathogenic variant was not identified, possible explanations for the cancer in the family may include: There may be no hereditary risk for cancer in the family. The cancers in Samantha Wang and/or her family may be due to other genetic or environmental factors. There may be a gene mutation in one of these genes that current testing methods cannot detect, but that chance is small. There could be another gene that has not yet been discovered, or that we have not yet tested, that is responsible for the cancer diagnoses in the family.  It is also possible there is a hereditary cause for the cancer in the family that Samantha Wang did not inherit.  Therefore, it is important to remain in touch with cancer genetics in the future so that we can continue to offer Samantha Wang the most up to date genetic testing.   ADDITIONAL GENETIC TESTING:  We discussed with Samantha Wang that her genetic testing was fairly extensive.  If there are genes identified to increase cancer risk that can be analyzed in the future, we would be happy to discuss and coordinate this testing at that time.    CANCER SCREENING RECOMMENDATIONS:  Samantha Wang test result is considered negative (normal).  This means that we have not identified a hereditary cause for her personal and family history of cancer at this time. Most cancers  happen by chance and this negative test suggests that her cancer may fall into this category.    An individual's cancer risk and medical management are not determined by genetic test results alone. Overall cancer risk assessment incorporates additional factors, including personal medical history, family history, and any available genetic information that may result in a personalized plan for cancer prevention and surveillance. Therefore, it is recommended she continue to follow the cancer management and screening guidelines provided by her oncology and primary healthcare provider.  Based on the reported personal and family history, specific cancer screenings for Samantha Wang and her family include:  Breast Cancer Screening:  The Tyrer-Cuzick model is one of multiple prediction models developed to estimate an individual's lifetime risk of developing breast cancer. The Tyrer-Cuzick model is endorsed by the [employer] (NCCN). This model includes many risk factors such as family history, endogenous estrogen exposure, and benign breast disease. The calculation is highly-dependent on the accuracy of clinical data provided by the patient and can change over time. The  Tyrer-Cuzick model may be repeated to reflect new information in her personal or family history in the future.   Ms. Barkalow Tyrer-Cuzick risk score is 10%. She is encouraged to continue to be mindful of her family history and be diligent with general population breast screening, including annual mammograms.  She is encouraged to contact us  regarding any changes to her personal or family history, as her recommendations for screening would be altered significantly if her lifetime risk is determined to be greater than 20% based on updated information.        RECOMMENDATIONS FOR FAMILY MEMBERS:   Since she did not inherit a mutation in a cancer predisposition gene included on this panel, her children could not  have inherited a mutation from her in one of these genes. Individuals in this family might be at some increased risk of developing cancer, over the general population risk, due to the family history of cancer. We recommend women in this family have a yearly mammogram beginning at age 68, or 62 years younger than the earliest onset of cancer, an annual clinical breast exam, and perform monthly breast self-exams. We do not recommend familial testing for the POLE variant of uncertain significance (VUS).  FOLLOW-UP:  Cancer genetics is a rapidly advancing field and it is possible that new genetic tests will be appropriate for her and/or her family members in the future. We encouraged her to remain in contact with cancer genetics on an annual basis so we can update her personal and family histories and let her know of advances in cancer genetics that may benefit this family.   Our contact number was provided. Samantha Wang questions were answered to her satisfaction, and she knows she is welcome to call us  at anytime with additional questions or concerns.   Samantha Bonito, MS, [facility] Genetic Counselor [email or web address] (P) [phone]

## 2023-11-24 ENCOUNTER — Telehealth: Payer: 59 | Admitting: Nurse Practitioner

## 2023-11-24 DIAGNOSIS — J069 Acute upper respiratory infection, unspecified: Secondary | ICD-10-CM | POA: Diagnosis not present

## 2023-11-24 MED ORDER — PROMETHAZINE-DM 6.25-15 MG/5ML PO SYRP: 5.0000 mL | ORAL_SOLUTION | Freq: Four times a day (QID) | ORAL | 0 refills | Status: DC | PRN

## 2023-11-24 NOTE — Progress Notes (Signed)
 I have spent 5 minutes in review of e-visit questionnaire, review and updating patient chart, medical decision making and response to patient.   Claiborne Rigg, NP

## 2023-11-24 NOTE — Progress Notes (Signed)

## 2023-12-01 ENCOUNTER — Telehealth: Payer: 59 | Admitting: Family

## 2023-12-01 DIAGNOSIS — J209 Acute bronchitis, unspecified: Secondary | ICD-10-CM

## 2023-12-01 MED ORDER — PREDNISONE 10 MG (21) PO TBPK: ORAL_TABLET | ORAL | 0 refills | Status: DC

## 2023-12-01 NOTE — Progress Notes (Signed)
 Virtual Visit Consent   Samantha Wang, you are scheduled for a virtual visit with a [facility] provider today. Just as with appointments in the office, your consent must be obtained to participate. Your consent will be active for this visit and any virtual visit you may have with one of our providers in the next 365 days. If you have a MyChart account, a copy of this consent can be sent to you electronically.  As this is a virtual visit, video technology does not allow for your provider to perform a traditional examination. This may limit your provider's ability to fully assess your condition. If your provider identifies any concerns that need to be evaluated in person or the need to arrange testing (such as labs, EKG, etc.), we will make arrangements to do so. Although advances in technology are sophisticated, we cannot ensure that it will always work on either your end or our end. If the connection with a video visit is poor, the visit may have to be switched to a telephone visit. With either a video or telephone visit, we are not always able to ensure that we have a secure connection.  By engaging in this virtual visit, you consent to the provision of healthcare and authorize for your insurance to be billed (if applicable) for the services provided during this visit. Depending on your insurance coverage, you may receive a charge related to this service.  I need to obtain your verbal consent now. Are you willing to proceed with your visit today? Samantha Wang has provided verbal consent on 12/01/2023 for a virtual visit (video or telephone). Samantha Learn, FNP  Date: 12/01/2023 3:21 PM  Virtual Visit via Video Note   I, Samantha Wang, connected with  Samantha Wang  ([id number], 01/20/2024) on 12/01/23 at  3:30 PM EST by a video-enabled telemedicine application and verified that I am speaking with the correct person using two identifiers.  Location: Patient: Virtual Visit Location  Patient: Home Provider: Virtual Visit Location Provider: Home Office   I discussed the limitations of evaluation and management by telemedicine and the availability of in person appointments. The patient expressed understanding and agreed to proceed.    History of Present Illness: Samantha Wang is a 53 y.o. who identifies as a female who was assigned female at birth, and is being seen today for cough and congestion that started 7-10 days ago. Reports she had flu like symptoms that started 10 days ago and her symptoms of body aches, chills, and fever has improved. However, continues to have a cough.   HPI: Cough This is a new problem. The current episode started 1 to 4 weeks ago. The problem has been waxing and waning. The problem occurs every few minutes. The cough is Productive of sputum. Associated symptoms include ear congestion, shortness of breath and wheezing. Pertinent negatives include no chills, ear pain, fever, headaches, myalgias, nasal congestion or postnasal drip. She has tried OTC cough suppressant and rest for the symptoms. The treatment provided mild relief.    Problems:  Patient Active Problem List   Diagnosis Date Noted   Genetic testing 10/22/2023   Endometrial cancer (HCC) 08/21/2023   Abdominal adhesions 08/21/2023   Concussion 06/11/2023   Prediabetes 08/15/2021   Menstrual migraine 08/15/2021   Iron  deficiency anemia 02/01/2021   Depression 04/18/2015   Insomnia 06/23/2014   Anxiety 06/10/2013   GERD (gastroesophageal reflux disease) 10/06/2012   PUD (peptic ulcer disease) 10/06/2012   IBS (irritable bowel  syndrome) 10/06/2012    Allergies: No Known Allergies Medications:  Current Outpatient Medications:    predniSONE  (STERAPRED UNI-PAK 21 TAB) 10 MG (21) TBPK tablet, Use as directed, Disp: 21 tablet, Rfl: 0   acetaminophen  (TYLENOL ) 500 MG tablet, Take 1,000 mg by mouth every 8 (eight) hours as needed for moderate pain (pain score 4-6)., Disp: , Rfl:     Calcium -Vitamin D -Vitamin K (VIACTIV CALCIUM  PLUS D) 650-12.5-40 MG-MCG-MCG CHEW, Chew 1-2 each by mouth daily., Disp: , Rfl:    clonazePAM  (KLONOPIN ) 0.5 MG tablet, 1/2 to 1 po BID prn anxiety, Disp: 30 tablet, Rfl: 3   fluticasone  (FLONASE ) 50 MCG/ACT nasal spray, USE TWO SPRAY(S) IN EACH NOSTRIL ONCE DAILY AS NEEDED FOR CONGESTION --  **NEEDS  OFFICE  VISIT** (Patient taking differently: Place 1 spray into both nostrils daily.), Disp: 16 g, Rfl: 5   loratadine (CLARITIN) 10 MG tablet, Take 1 tablet by mouth daily., Disp: , Rfl:    methocarbamol (ROBAXIN) 500 MG tablet, Take 500 mg by mouth every 6 (six) hours as needed for muscle spasms., Disp: , Rfl:    promethazine -dextromethorphan (PROMETHAZINE -DM) 6.25-15 MG/5ML syrup, Take 5 mLs by mouth 4 (four) times daily as needed for cough., Disp: 240 mL, Rfl: 0   senna-docusate (SENOKOT-S) 8.6-50 MG tablet, Take 2 tablets by mouth at bedtime. For AFTER surgery, do not take if having diarrhea, Disp: 30 tablet, Rfl: 0   SUMAtriptan  (IMITREX ) 100 MG tablet, TAKE 1 TABLET BY MOUTH AS NEEDED FOR MIGRAINE. MAY REPEAT ONCE AFTER 2 HOURS, Disp: 10 tablet, Rfl: 5  Observations/Objective: Patient is well-developed, well-nourished in no acute distress.  Resting comfortably  at home.  Head is normocephalic, atraumatic.  No labored breathing.  Speech is clear and coherent with logical content.  Patient is alert and oriented at baseline.  Dry nonproductive cough  Assessment and Plan: 1. Acute bronchitis, unspecified organism (Primary) - predniSONE  (STERAPRED UNI-PAK 21 TAB) 10 MG (21) TBPK tablet; Use as directed  Dispense: 21 tablet; Refill: 0  - Take meds as prescribed - Use a cool mist humidifier  -Use saline nose sprays frequently -Force fluids -For any cough or congestion  Use plain Mucinex- regular strength or max strength is fine -For fever or aces or pains- take tylenol  or ibuprofen. -Throat lozenges if help -Follow up if symptoms worsen or do  not improve   Follow Up Instructions: I discussed the assessment and treatment plan with the patient. The patient was provided an opportunity to ask questions and all were answered. The patient agreed with the plan and demonstrated an understanding of the instructions.  A copy of instructions were sent to the patient via MyChart unless otherwise noted below.     The patient was advised to call back or seek an in-person evaluation if the symptoms worsen or if the condition fails to improve as anticipated.    Samantha Learn, FNP

## 2023-12-04 ENCOUNTER — Ambulatory Visit: Payer: 59 | Admitting: Family Medicine

## 2023-12-04 ENCOUNTER — Encounter: Payer: Self-pay | Admitting: Family Medicine

## 2023-12-04 VITALS — BP 130/79 | HR 77 | Temp 97.4°F | Ht 62.0 in | Wt 160.0 lb

## 2023-12-04 DIAGNOSIS — R030 Elevated blood-pressure reading, without diagnosis of hypertension: Secondary | ICD-10-CM | POA: Diagnosis not present

## 2023-12-04 NOTE — Progress Notes (Signed)
Subjective:  Patient ID: Samantha Wang, female    DOB: Sep 24, 1971  Age: 53 y.o. MRN: [id number]  CC:   Chief Complaint  Patient presents with   follow up bp     Has been having readings with dystolic reading elevated     HPI:  53 year old female presents for evaluation of the above.  Patient states that she has had elevated blood pressure readings at home and at the cancer center.  Predominantly elevated diastolic pressures.  She is concerned about this.  She is feeling well but this makes her anxious.  BP 130/79 today.  Will discuss.  Patient Active Problem List   Diagnosis Date Noted   Elevated BP without diagnosis of hypertension 12/04/2023   Genetic testing 10/22/2023   Endometrial cancer (HCC) 08/21/2023   Abdominal adhesions 08/21/2023   Prediabetes 08/15/2021   Menstrual migraine 08/15/2021   Iron deficiency anemia 02/01/2021   Depression 04/18/2015   Insomnia 06/23/2014   Anxiety 06/10/2013   GERD (gastroesophageal reflux disease) 10/06/2012   PUD (peptic ulcer disease) 10/06/2012   IBS (irritable bowel syndrome) 10/06/2012    Social Hx   Social History   Socioeconomic History   Marital status: Married    Spouse name: Not on file   Number of children: Not on file   Years of education: Not on file   Highest education level: Bachelor's degree (e.g., BA, AB, BS)  Occupational History   Occupation: payroll  Tobacco Use   Smoking status: Never   Smokeless tobacco: Never  Vaping Use   Vaping status: Never Used  Substance and Sexual Activity   Alcohol use: Not Currently   Drug use: Never   Sexual activity: Yes    Partners: Male    Birth control/protection: Other-see comments    Comment: spouse-vasectomy   Other Topics Concern   Not on file  Social History Narrative   Not on file   Social Drivers of Health   Financial Resource Strain: Low Risk  (12/01/2023)   Overall Financial Resource Strain (CARDIA)    Difficulty of Paying Living Expenses: Not  hard at all  Food Insecurity: No Food Insecurity (12/01/2023)   Hunger Vital Sign    Worried About Running Out of Food in the Last Year: Never true    Ran Out of Food in the Last Year: Never true  Transportation Needs: No Transportation Needs (12/01/2023)   PRAPARE - Administrator, Civil Service (Medical): No    Lack of Transportation (Non-Medical): No  Physical Activity: Insufficiently Active (12/01/2023)   Exercise Vital Sign    Days of Exercise per Week: 4 days    Minutes of Exercise per Session: 30 min  Stress: No Stress Concern Present (12/01/2023)   [employer] of Occupational Health - Occupational Stress Questionnaire    Feeling of Stress : Only a little  Social Connections: Moderately Integrated (12/01/2023)   Social Connection and Isolation Panel [NHANES]    Frequency of Communication with Friends and Family: More than three times a week    Frequency of Social Gatherings with Friends and Family: Twice a week    Attends Religious Services: 1 to 4 times per year    Active Member of [employer] or Organizations: No    Attends Engineer, structural: Not on file    Marital Status: Married    Review of Systems Per HPI  Objective:  BP 130/79   Pulse 77   Temp (!) 97.4 F (36.3 C)  Ht 5\' 2"  (1.575 m)   Wt 160 lb (72.6 kg)   SpO2 97%   BMI 29.26 kg/m      12/04/2023    8:29 AM 12/04/2023    8:23 AM 10/01/2023    9:03 AM  BP/Weight  Systolic BP 130 150 126  Diastolic BP 79 88 87  Wt. (Lbs)  160 160  BMI  29.26 kg/m2 29.26 kg/m2    Physical Exam Vitals and nursing note reviewed.  Constitutional:      General: She is not in acute distress.    Appearance: Normal appearance.  HENT:     Head: Normocephalic and atraumatic.  Eyes:     General:        Right eye: No discharge.        Left eye: No discharge.     Conjunctiva/sclera: Conjunctivae normal.  Cardiovascular:     Rate and Rhythm: Normal rate and regular rhythm.  Pulmonary:     Effort:  Pulmonary effort is normal.     Breath sounds: Normal breath sounds. No wheezing, rhonchi or rales.  Neurological:     Mental Status: She is alert.  Psychiatric:        Mood and Affect: Mood normal.        Behavior: Behavior normal.     Lab Results  Component Value Date   WBC 5.3 08/20/2023   HGB 14.2 08/20/2023   HCT 43.3 08/20/2023   PLT 272 08/20/2023   GLUCOSE 130 (H) 08/20/2023   CHOL 175 03/26/2023   TRIG 91 03/26/2023   HDL 70 03/26/2023   LDLCALC 88 03/26/2023   ALT 18 08/20/2023   AST 18 08/20/2023   NA 140 08/20/2023   K 3.8 08/20/2023   CL 106 08/20/2023   CREATININE 0.65 08/20/2023   BUN 16 08/20/2023   CO2 27 08/20/2023   TSH 1.450 07/29/2019   HGBA1C 5.9 (H) 03/26/2023     Assessment & Plan:   Problem List Items Addressed This Visit       Other   Elevated BP without diagnosis of hypertension - Primary   BP 130/79 today.  I have reviewed her home readings.  We discussed medication and also lifestyle changes.  Will proceed with dietary changes and regular exercise for 3 months and reassess.      Follow-up:  Return in about 3 months (around 03/02/2024).  20 minutes were spent during this encounter reviewing labs and prior notes, history and physical, discussing dietary and lifestyle changes with the patient, and discussing proper technique to monitor blood pressure.  Everlene Other DO [facility]

## 2023-12-04 NOTE — Patient Instructions (Signed)
Monitor BP at home.  Follow up in 3 months.

## 2023-12-04 NOTE — Assessment & Plan Note (Signed)
BP 130/79 today.  I have reviewed her home readings.  We discussed medication and also lifestyle changes.  Will proceed with dietary changes and regular exercise for 3 months and reassess.

## 2023-12-18 ENCOUNTER — Ambulatory Visit: Payer: 59 | Admitting: Family Medicine

## 2024-03-06 ENCOUNTER — Ambulatory Visit: Payer: 59 | Admitting: Family Medicine

## 2024-03-11 ENCOUNTER — Telehealth: Payer: Self-pay

## 2024-03-11 NOTE — Telephone Encounter (Signed)
 Patient was originally scheduled for 6/13 and Dr Orvil Bland was not going to be here.  Rescheduled her for 7/10 @ 1:45pm.. patient confirmed and understood.

## 2024-04-03 ENCOUNTER — Ambulatory Visit: Payer: 59 | Admitting: Gynecologic Oncology

## 2024-04-29 ENCOUNTER — Encounter: Payer: Self-pay | Admitting: Gynecologic Oncology

## 2024-04-30 ENCOUNTER — Inpatient Hospital Stay: Attending: Gynecologic Oncology | Admitting: Gynecologic Oncology

## 2024-04-30 ENCOUNTER — Encounter: Payer: Self-pay | Admitting: Gynecologic Oncology

## 2024-04-30 ENCOUNTER — Ambulatory Visit: Admitting: Gynecologic Oncology

## 2024-04-30 VITALS — BP 132/82 | HR 81 | Temp 97.8°F | Resp 18 | Wt 156.6 lb

## 2024-04-30 DIAGNOSIS — R195 Other fecal abnormalities: Secondary | ICD-10-CM | POA: Insufficient documentation

## 2024-04-30 DIAGNOSIS — Z9079 Acquired absence of other genital organ(s): Secondary | ICD-10-CM | POA: Diagnosis not present

## 2024-04-30 DIAGNOSIS — C541 Malignant neoplasm of endometrium: Secondary | ICD-10-CM | POA: Diagnosis present

## 2024-04-30 DIAGNOSIS — Z5112 Encounter for antineoplastic immunotherapy: Secondary | ICD-10-CM | POA: Diagnosis present

## 2024-04-30 DIAGNOSIS — Z90710 Acquired absence of both cervix and uterus: Secondary | ICD-10-CM | POA: Insufficient documentation

## 2024-04-30 DIAGNOSIS — N941 Unspecified dyspareunia: Secondary | ICD-10-CM | POA: Insufficient documentation

## 2024-04-30 DIAGNOSIS — Z90722 Acquired absence of ovaries, bilateral: Secondary | ICD-10-CM | POA: Diagnosis not present

## 2024-04-30 DIAGNOSIS — N952 Postmenopausal atrophic vaginitis: Secondary | ICD-10-CM | POA: Diagnosis not present

## 2024-04-30 MED ORDER — ESTRADIOL 0.1 MG/GM VA CREA: 1.0000 | TOPICAL_CREAM | VAGINAL | 12 refills | Status: AC

## 2024-04-30 NOTE — Progress Notes (Signed)
 Gynecologic Oncology Return Clinic Visit  04/30/24  Reason for Visit: surveillance  Treatment History: Oncology History  Endometrial cancer (HCC)  08/21/2023 Initial Diagnosis   Endometrial cancer (HCC)    Genetic Testing   [facility] CancerNext-Expanded Panel+RNA was Negative. Of note, a variant of uncertain significance was detected in the POLE gene (p.[id number]). Report date is 10/22/2023.   The CancerNext-Expanded gene panel offered by [facility] and includes sequencing, rearrangement, and RNA analysis for the following 76 genes: AIP, ALK, APC, ATM, AXIN2, BAP1, BARD1, BMPR1A, BRCA1, BRCA2, BRIP1, CDC73, CDH1, CDK4, CDKN1B, CDKN2A, CEBPA, CHEK2, CTNNA1, DDX41, DICER1, ETV6, FH, FLCN, GATA2, LZTR1, MAX, MBD4, MEN1, MET, MLH1, MSH2, MSH3, MSH6, MUTYH, NF1, NF2, NTHL1, PALB2, PHOX2B, PMS2, POT1, PRKAR1A, PTCH1, PTEN, RAD51C, RAD51D, RB1, RET, RUNX1, SDHA, SDHAF2, SDHB, SDHC, SDHD, SMAD4, SMARCA4, SMARCB1, SMARCE1, STK11, SUFU, TMEM127, TP53, TSC1, TSC2, VHL, and WT1 (sequencing and deletion/duplication); EGFR, HOXB13, KIT, MITF, PDGFRA, POLD1, and POLE (sequencing only); EPCAM and GREM1 (deletion/duplication only).      The patient presented for her annual exam in mid September.  She had spotting that she endorsed at that time in August.  Her last menstrual cycle had been in October 2023.  She also endorsed some intermittent spotting as well as postcoital spotting.  Endometrial ultrasound was performed on 10/4 and showed a uterus measuring 8.7 x 3.7 x 5.2 cm with an endometrium measuring 9 mm.  Bilateral ovaries normal in appearance without masses.  Endometrial biopsy was performed on 08/12/23 and revealed grade 1 endometrioid endometrial adenocarcinoma. MMR with loss of MLH1 and PMS2.    08/21/23: Diagnostic laparoscopy, lysis of adhesions, robotic-assisted laparoscopic total hysterectomy with bilateral salpingoophorectomy, SLN biopsy bilaterally   Interval History: Overall doing well.  Notes  that since recovering from surgery, she has some dyspareunia.  Notes that this felt initially like sandpaper during intercourse.  Lubrication has helped some but is still uncomfortable.  She initially struggled with constipation after surgery.  After the first few months, she has had more diarrhea and loose stools.  Saw gastroenterology.  Was given a course of antibiotics given her history to treat presumed bacterial overgrowth.  This helped some but still notes stool is loose/soft.  Denies any vaginal bleeding.  Past Medical/Surgical History: Past Medical History:  Diagnosis Date   Abdominal adhesions 08/21/2023   Allergy    Anemia    Anxiety    Breast nodule    left    Cancer (HCC)    GERD (gastroesophageal reflux disease)    H/O cesarean section    history of 2 c-sections     History of hiatal hernia    Peptic ulcer disease    PONV (postoperative nausea and vomiting)    Reflux     Past Surgical History:  Procedure Laterality Date   BIOPSY  07/19/2017   Procedure: BIOPSY;  Surgeon: Golda Claudis PENNER, MD;  [facility]: [facility];  Service: Endoscopy;;  duodenum   CESAREAN SECTION  7996,8001   CESAREAN SECTION     x2   COLONOSCOPY N/A 07/19/2017   Procedure: COLONOSCOPY;  Surgeon: Golda Claudis PENNER, MD;  Location: [facility];  Service: Endoscopy;  Laterality: N/A;   ESOPHAGOGASTRODUODENOSCOPY N/A 10/11/2015   Procedure: ESOPHAGOGASTRODUODENOSCOPY (EGD);  Surgeon: Claudis PENNER Golda, MD;  [facility]: [facility];  Service: Endoscopy;  Laterality: N/A;  1230   ESOPHAGOGASTRODUODENOSCOPY N/A 06/06/2016   Procedure: ESOPHAGOGASTRODUODENOSCOPY (EGD);  Surgeon: Claudis PENNER Golda, MD;  [facility]: [facility];  Service: Endoscopy;  Laterality:  N/A;  255   ESOPHAGOGASTRODUODENOSCOPY N/A 07/19/2017   Procedure: ESOPHAGOGASTRODUODENOSCOPY (EGD);  Surgeon: Golda Claudis PENNER, MD;  Location: [facility];  Service: Endoscopy;  Laterality: N/A;  8:25   EYE SURGERY     PARTIAL GASTRECTOMY  2017    ROBOTIC ASSISTED TOTAL HYSTERECTOMY WITH BILATERAL SALPINGO OOPHERECTOMY N/A 08/21/2023   Procedure: XI ROBOTIC ASSISTED TOTAL HYSTERECTOMY WITH BILATERAL SALPINGO OOPHORECTOMY, LYSIS OF ADHESIONS;  Surgeon: Viktoria Comer SAUNDERS, MD;  Location: [facility];  Service: Gynecology;  Laterality: N/A;   SENTINEL NODE BIOPSY N/A 08/21/2023   Procedure: SENTINEL NODE BIOPSY;  Surgeon: Viktoria Comer SAUNDERS, MD;  Location: [facility];  Service: Gynecology;  Laterality: N/A;   ulcer surgery  08/2016   abdominal   UPPER GASTROINTESTINAL ENDOSCOPY      Family History  Problem Relation Age of Onset   Healthy Mother    Brain cancer Mother 39       glioblastoma   Hyperlipidemia Father    Healthy Brother    Lung cancer Paternal Uncle 55 - 46       smoked   Bone cancer Paternal Uncle 33 - 79       spine   Lung cancer Paternal Grandmother 58 - 23       smoked   Breast cancer Paternal Grandmother 87 - 39   Healthy Daughter    Healthy Son    Pancreatic cancer Other 50       maternal great aunt's daughter    Social History   Socioeconomic History   Marital status: Married    Spouse name: Not on file   Number of children: Not on file   Years of education: Not on file   Highest education level: Bachelor's degree (e.g., BA, AB, BS)  Occupational History   Occupation: payroll  Tobacco Use   Smoking status: Never   Smokeless tobacco: Never  Vaping Use   Vaping status: Never Used  Substance and Sexual Activity   Alcohol use: Not Currently   Drug use: Never   Sexual activity: Yes    Partners: Male    Birth control/protection: Other-see comments    Comment: spouse-vasectomy   Other Topics Concern   Not on file  Social History Narrative   Not on file   Social Drivers of Health   Financial Resource Strain: Low Risk  (12/01/2023)   Overall Financial Resource Strain (CARDIA)    Difficulty of Paying Living Expenses: Not hard at all  Food Insecurity: No Food Insecurity (12/01/2023)   Hunger Vital Sign     Worried About Running Out of Food in the Last Year: Never true    Ran Out of Food in the Last Year: Never true  Transportation Needs: No Transportation Needs (12/01/2023)   PRAPARE - Administrator, Civil Service (Medical): No    Lack of Transportation (Non-Medical): No  Physical Activity: Insufficiently Active (12/01/2023)   Exercise Vital Sign    Days of Exercise per Week: 4 days    Minutes of Exercise per Session: 30 min  Stress: No Stress Concern Present (12/01/2023)   [employer] of Occupational Health - Occupational Stress Questionnaire    Feeling of Stress : Only a little  Social Connections: Moderately Integrated (12/01/2023)   Social Connection and Isolation Panel    Frequency of Communication with Friends and Family: More than three times a week    Frequency of Social Gatherings with Friends and Family: Twice a week    Attends Religious  Services: 1 to 4 times per year    Active Member of Clubs or Organizations: No    Attends Engineer, structural: Not on file    Marital Status: Married    Current Medications:  Current Outpatient Medications:    acetaminophen  (TYLENOL ) 500 MG tablet, Take 1,000 mg by mouth every 8 (eight) hours as needed for moderate pain (pain score 4-6)., Disp: , Rfl:    Calcium -Vitamin D -Vitamin K (VIACTIV CALCIUM  PLUS D) 650-12.5-40 MG-MCG-MCG CHEW, Chew 1-2 each by mouth daily., Disp: , Rfl:    clonazePAM  (KLONOPIN ) 0.5 MG tablet, 1/2 to 1 po BID prn anxiety, Disp: 30 tablet, Rfl: 3   [START ON 05/01/2024] estradiol  (ESTRACE  VAGINAL) 0.1 MG/GM vaginal cream, Place 1 Applicatorful vaginally 3 (three) times a week., Disp: 42.5 g, Rfl: 12   loratadine (CLARITIN) 10 MG tablet, Take 1 tablet by mouth daily., Disp: , Rfl:    SUMAtriptan  (IMITREX ) 100 MG tablet, TAKE 1 TABLET BY MOUTH AS NEEDED FOR MIGRAINE. MAY REPEAT ONCE AFTER 2 HOURS, Disp: 10 tablet, Rfl: 5   predniSONE  (STERAPRED UNI-PAK 21 TAB) 10 MG (21) TBPK tablet, Use as directed,  Disp: 21 tablet, Rfl: 0   promethazine -dextromethorphan (PROMETHAZINE -DM) 6.25-15 MG/5ML syrup, Take 5 mLs by mouth 4 (four) times daily as needed for cough., Disp: 240 mL, Rfl: 0  Review of Systems: Denies appetite changes, fevers, chills, fatigue, unexplained weight changes. Denies hearing loss, neck lumps or masses, mouth sores, ringing in ears or voice changes. Denies cough or wheezing.  Denies shortness of breath. Denies chest pain or palpitations. Denies leg swelling. Denies abdominal distention, pain, blood in stools, constipation, nausea, vomiting, or early satiety. Denies dysuria, frequency, hematuria or incontinence. Denies hot flashes, pelvic pain, vaginal bleeding or vaginal discharge.   Denies joint pain, back pain or muscle pain/cramps. Denies itching, rash, or wounds. Denies dizziness, headaches, numbness or seizures. Denies swollen lymph nodes or glands, denies easy bruising or bleeding. Denies anxiety, depression, confusion, or decreased concentration.  Physical Exam: BP (!) 150/87 (BP Location: Left Arm, Patient Position: Sitting)   Pulse 81   Temp 97.8 F (36.6 C) (Oral)   Resp 18   Wt 156 lb 9.6 oz (71 kg)   SpO2 97%   BMI 28.64 kg/m  General: Alert, oriented, no acute distress. HEENT: Posterior oropharynx clear, sclera anicteric. Chest: Clear to auscultation bilaterally.  No wheezes or rhonchi. Cardiovascular: Regular rate and rhythm, no murmurs. Abdomen: soft, nontender.  Normoactive bowel sounds.  No masses or hepatosplenomegaly appreciated.  Well-healed incisions. Extremities: Grossly normal range of motion.  Warm, well perfused.  No edema bilaterally. Skin: No rashes or lesions noted. Lymphatics: No cervical, supraclavicular, or inguinal adenopathy. GU: Normal appearing external genitalia without erythema, excoriation, or lesions.  Speculum exam reveals mildly atrophic vaginal mucosa, no lesions noted.  Cuff intact.  Bimanual exam reveals no nodularity or  masses appreciated.  Rectovaginal exam confirms these findings.  Laboratory & Radiologic Studies: None new  Assessment & Plan: ARTELIA GAME is a 53 y.o. woman with Stage IA2 grade 1 endometrioid endometrial adenocarcinoma who presents for surveillance. P53 WT. MMRd. MSI-H. MLH1 promoter hypermethylation present. Germline: VUS in POLE.  Patient is overall doing well, NED on exam today.  In terms of her dyspareunia, discussed importance of continued lubrication use.  Also discussed possible trial of vaginal estrogen given mild atrophy on exam.  Prescription sent to her pharmacy.  She is having some hot flashes, which improved with Estroven.  She had  self discontinued this.  Discussed options for nonhormonal and hormonal management.  Plan is for her to restart Estroven.  If this does not help with symptoms sufficiently, she will reach out to me.  With regard to her GI symptoms, she is overall tolerating p.o. without nausea or emesis.  Discussed reaching back out to GI.  Sounds like this may be a transit issue.  Suggested that she add some additional bulking agent to her regimen.  Per [facility] surveillance recommendations, we will plan on surveillance visits every 6 months alternating between my office and her OB/GYN office.  She will see her OB/GYN in 6 months and return to see us  in 12 months.  We reviewed symptoms that should prompt a phone call before her next scheduled visit.  22 minutes of total time was spent for this patient encounter, including preparation, face-to-face counseling with the patient and coordination of care, and documentation of the encounter.  Comer Dollar, MD  Division of Gynecologic Oncology  Department of Obstetrics and Gynecology  [facility]  [facility]

## 2024-04-30 NOTE — Patient Instructions (Signed)
 It was good to see you today.  I do not see or feel any evidence of cancer recurrence on your exam.  I will see you for follow-up in 12 months. Please reach out to your OB/GYN for a follow-up visit in 6 months.  I sent the prescription for vaginal estrogen to your pharmacy.  Please let me know in the next 2-3 months if you notice any difference.  Please also keep me posted about your bowel function.  As always, if you develop any new and concerning symptoms before your next visit, please call to see me sooner.

## 2024-05-18 ENCOUNTER — Ambulatory Visit: Admitting: Family Medicine

## 2024-05-18 VITALS — BP 144/88 | HR 70 | Temp 98.4°F | Ht 62.0 in | Wt 159.2 lb

## 2024-05-18 DIAGNOSIS — R21 Rash and other nonspecific skin eruption: Secondary | ICD-10-CM

## 2024-05-18 MED ORDER — VALACYCLOVIR HCL 1 G PO TABS
1000.0000 mg | ORAL_TABLET | Freq: Three times a day (TID) | ORAL | 0 refills | Status: AC
Start: 2024-05-18 — End: 2024-05-28

## 2024-05-18 MED ORDER — TRIAMCINOLONE ACETONIDE 0.5 % EX OINT: 1.0000 | TOPICAL_OINTMENT | Freq: Two times a day (BID) | CUTANEOUS | 0 refills | Status: AC

## 2024-05-18 NOTE — Patient Instructions (Signed)
Medications as prescribed.  If continues to persist, please let me know.

## 2024-05-18 NOTE — Progress Notes (Signed)
 Subjective:  Patient ID: Samantha Wang, female    DOB: 11-20-1970  Age: 53 y.o. MRN: [id number]  CC: Rash   HPI:  53 year old female presents with rash.  Patient reports that on Friday she had left-sided thoracic back pain which seem to radiate around anteriorly.  She states that she subsequently developed a rash in 2 small places on the anterior abdomen.  She is concerned that she may have shingles.  No relieving factors.  No new changes or exposures.  No other complaints at this time.  Patient Active Problem List   Diagnosis Date Noted   Rash 05/18/2024   Elevated BP without diagnosis of hypertension 12/04/2023   Genetic testing 10/22/2023   Endometrial cancer (HCC) 08/21/2023   Abdominal adhesions 08/21/2023   Prediabetes 08/15/2021   Menstrual migraine 08/15/2021   Iron  deficiency anemia 02/01/2021   Depression 04/18/2015   Insomnia 06/23/2014   Anxiety 06/10/2013   GERD (gastroesophageal reflux disease) 10/06/2012   PUD (peptic ulcer disease) 10/06/2012   IBS (irritable bowel syndrome) 10/06/2012    Social Hx   Social History   Socioeconomic History   Marital status: Married    Spouse name: Not on file   Number of children: Not on file   Years of education: Not on file   Highest education level: Bachelor's degree (e.g., BA, AB, BS)  Occupational History   Occupation: payroll  Tobacco Use   Smoking status: Never   Smokeless tobacco: Never  Vaping Use   Vaping status: Never Used  Substance and Sexual Activity   Alcohol use: Not Currently   Drug use: Never   Sexual activity: Yes    Partners: Male    Birth control/protection: Other-see comments    Comment: spouse-vasectomy   Other Topics Concern   Not on file  Social History Narrative   Not on file   Social Drivers of Health   Financial Resource Strain: Low Risk  (05/18/2024)   Overall Financial Resource Strain (CARDIA)    Difficulty of Paying Living Expenses: Not hard at all  Food Insecurity: No  Food Insecurity (05/18/2024)   Hunger Vital Sign    Worried About Running Out of Food in the Last Year: Never true    Ran Out of Food in the Last Year: Never true  Transportation Needs: No Transportation Needs (05/18/2024)   PRAPARE - Administrator, Civil Service (Medical): No    Lack of Transportation (Non-Medical): No  Physical Activity: Sufficiently Active (05/18/2024)   Exercise Vital Sign    Days of Exercise per Week: 4 days    Minutes of Exercise per Session: 40 min  Stress: No Stress Concern Present (05/18/2024)   [employer] of Occupational Health - Occupational Stress Questionnaire    Feeling of Stress: Only a little  Social Connections: Moderately Integrated (05/18/2024)   Social Connection and Isolation Panel    Frequency of Communication with Friends and Family: More than three times a week    Frequency of Social Gatherings with Friends and Family: Once a week    Attends Religious Services: 1 to 4 times per year    Active Member of [employer] or Organizations: No    Attends Engineer, structural: Not on file    Marital Status: Married    Review of Systems Per HPI  Objective:  BP (!) 144/88   Pulse 70   Temp 98.4 F (36.9 C)   Ht 5' 2 (1.575 m)   Wt 159 lb 3.2  oz (72.2 kg)   SpO2 100%   BMI 29.12 kg/m      05/18/2024   11:24 AM 05/18/2024   11:14 AM 04/30/2024    2:11 PM  BP/Weight  Systolic BP 144 143 132  Diastolic BP 88 91 82  Wt. (Lbs)  159.2   BMI  29.12 kg/m2     Physical Exam Vitals and nursing note reviewed.  Constitutional:      General: She is not in acute distress.    Appearance: Normal appearance.  HENT:     Head: Normocephalic and atraumatic.  Pulmonary:     Effort: Pulmonary effort is normal. No respiratory distress.  Abdominal:      Comments: Patient has 2 small areas which are slightly raised and erythematous.  Erythema of the inferior area seems passes the midline.  Neurological:     Mental Status: She is alert.      Lab Results  Component Value Date   WBC 5.3 08/20/2023   HGB 14.2 08/20/2023   HCT 43.3 08/20/2023   PLT 272 08/20/2023   GLUCOSE 130 (H) 08/20/2023   CHOL 175 03/26/2023   TRIG 91 03/26/2023   HDL 70 03/26/2023   LDLCALC 88 03/26/2023   ALT 18 08/20/2023   AST 18 08/20/2023   NA 140 08/20/2023   K 3.8 08/20/2023   CL 106 08/20/2023   CREATININE 0.65 08/20/2023   BUN 16 08/20/2023   CO2 27 08/20/2023   TSH 1.450 07/29/2019   HGBA1C 5.9 (H) 03/26/2023     Assessment & Plan:  Rash Assessment & Plan: Possibly shingles although the fact that the erythema one of the areas has is midline makes this less likely.  Covering with Valtrex .  Topical triamcinolone  as well.   Other orders -     valACYclovir  HCl; Take 1 tablet (1,000 mg total) by mouth 3 (three) times daily for 10 days.  Dispense: 30 tablet; Refill: 0 -     Triamcinolone  Acetonide; Apply 1 Application topically 2 (two) times daily.  Dispense: 30 g; Refill: 0    Follow-up:  Return if symptoms worsen or fail to improve.  Jacqulyn Ahle DO [facility]

## 2024-05-18 NOTE — Assessment & Plan Note (Signed)
 Possibly shingles although the fact that the erythema one of the areas has is midline makes this less likely.  Covering with Valtrex .  Topical triamcinolone  as well.

## 2024-07-09 ENCOUNTER — Other Ambulatory Visit: Payer: Self-pay | Admitting: Obstetrics and Gynecology

## 2024-07-09 DIAGNOSIS — Z1231 Encounter for screening mammogram for malignant neoplasm of breast: Secondary | ICD-10-CM

## 2024-07-15 ENCOUNTER — Ambulatory Visit
Admission: RE | Admit: 2024-07-15 | Discharge: 2024-07-15 | Disposition: A | Discharge status: Home / Self Care | Source: Ambulatory Visit | Attending: Obstetrics and Gynecology | Admitting: Obstetrics and Gynecology

## 2024-07-15 ENCOUNTER — Encounter (HOSPITAL_COMMUNITY): Payer: Self-pay | Admitting: Oncology

## 2024-07-15 DIAGNOSIS — Z1231 Encounter for screening mammogram for malignant neoplasm of breast: Secondary | ICD-10-CM

## 2024-07-19 ENCOUNTER — Ambulatory Visit: Payer: Self-pay | Admitting: Obstetrics and Gynecology

## 2024-08-05 ENCOUNTER — Encounter (INDEPENDENT_AMBULATORY_CARE_PROVIDER_SITE_OTHER): Payer: Self-pay | Admitting: Gastroenterology

## 2024-11-03 ENCOUNTER — Ambulatory Visit: Admitting: Obstetrics and Gynecology

## 2024-11-03 ENCOUNTER — Encounter: Payer: Self-pay | Admitting: Obstetrics and Gynecology

## 2024-11-03 VITALS — BP 126/84 | HR 86

## 2024-11-03 DIAGNOSIS — C541 Malignant neoplasm of endometrium: Secondary | ICD-10-CM | POA: Diagnosis not present

## 2024-11-03 NOTE — Progress Notes (Signed)
 "  GYNECOLOGY  VISIT   HPI: 54 y.o.   Married  Caucasian female   G2P2002 with Patient's last menstrual period was 05/23/2023.   here for: 6 month follow up from [facility] 04/30/24   Stage IA2 grade 1 endometrioid endometrial adenocarcinoma. 08/21/23: Diagnostic laparoscopy, lysis of adhesions, robotic-assisted laparoscopic total hysterectomy with bilateral salpingoophorectomy, SLN biopsy bilaterally    No vaginal bleeding.   Some hot flashes.    They come and go.  Not wanting to treat this.     Sexual activity was painful, but not now.  Not using vaginal cream any longer.   No urinary pain or blood in the urine.  Some frequency.  No blood in stool, constipation or fecal incontinence.     GYNECOLOGIC HISTORY: Patient's last menstrual period was 05/23/2023. Contraception:  Hyst Menopausal hormone therapy:  Estrace   Last 2 paps:  08/12/23 neg, HR HPV neg, 07/02/23 neg, HR HPV neg  History of abnormal Pap or positive HPV:  no Mammogram:  07/15/24 Breast Density Cat B, BIRADS Cat 1 neg         OB History     Gravida  2   Para  2   Term  2   Preterm      AB      Living  2      SAB      IAB      Ectopic      Multiple      Live Births                 Patient Active Problem List   Diagnosis Date Noted   Rash 05/18/2024   Elevated BP without diagnosis of hypertension 12/04/2023   Genetic testing 10/22/2023   Endometrial cancer (HCC) 08/21/2023   Abdominal adhesions 08/21/2023   Prediabetes 08/15/2021   Menstrual migraine 08/15/2021   Iron  deficiency anemia 02/01/2021   Depression 04/18/2015   Insomnia 06/23/2014   Anxiety 06/10/2013   GERD (gastroesophageal reflux disease) 10/06/2012   PUD (peptic ulcer disease) 10/06/2012   IBS (irritable bowel syndrome) 10/06/2012    Past Medical History:  Diagnosis Date   Abdominal adhesions 08/21/2023   Allergy    Anemia    Anxiety    Breast nodule    left    Cancer (HCC)    Endometrial cancer (HCC) 2024    GERD (gastroesophageal reflux disease)    H/O cesarean section    history of 2 c-sections     History of hiatal hernia    Peptic ulcer disease    PONV (postoperative nausea and vomiting)    Reflux     Past Surgical History:  Procedure Laterality Date   BIOPSY  07/19/2017   Procedure: BIOPSY;  Surgeon: Golda Claudis PENNER, MD;  Location: [facility];  Service: Endoscopy;;  duodenum   BREAST BIOPSY Left    CESAREAN SECTION  [id number]   CESAREAN SECTION     x2   COLONOSCOPY N/A 07/19/2017   Procedure: COLONOSCOPY;  Surgeon: Golda Claudis PENNER, MD;  Location: [facility];  Service: Endoscopy;  Laterality: N/A;   ESOPHAGOGASTRODUODENOSCOPY N/A 10/11/2015   Procedure: ESOPHAGOGASTRODUODENOSCOPY (EGD);  Surgeon: Claudis PENNER Golda, MD;  Location: [facility];  Service: Endoscopy;  Laterality: N/A;  1230   ESOPHAGOGASTRODUODENOSCOPY N/A 06/06/2016   Procedure: ESOPHAGOGASTRODUODENOSCOPY (EGD);  Surgeon: Claudis PENNER Golda, MD;  Location: [facility];  Service: Endoscopy;  Laterality: N/A;  255   ESOPHAGOGASTRODUODENOSCOPY N/A 07/19/2017   Procedure:  ESOPHAGOGASTRODUODENOSCOPY (EGD);  Surgeon: Golda Claudis PENNER, MD;  Location: [facility];  Service: Endoscopy;  Laterality: N/A;  8:25   EYE SURGERY     PARTIAL GASTRECTOMY  2017   ROBOTIC ASSISTED TOTAL HYSTERECTOMY WITH BILATERAL SALPINGO OOPHERECTOMY N/A 08/21/2023   Procedure: XI ROBOTIC ASSISTED TOTAL HYSTERECTOMY WITH BILATERAL SALPINGO OOPHORECTOMY, LYSIS OF ADHESIONS;  Surgeon: Viktoria Comer SAUNDERS, MD;  Location: [facility];  Service: Gynecology;  Laterality: N/A;   SENTINEL NODE BIOPSY N/A 08/21/2023   Procedure: SENTINEL NODE BIOPSY;  Surgeon: Viktoria Comer SAUNDERS, MD;  Location: [facility];  Service: Gynecology;  Laterality: N/A;   ulcer surgery  08/2016   abdominal   UPPER GASTROINTESTINAL ENDOSCOPY      Current Outpatient Medications  Medication Sig Dispense Refill   acetaminophen  (TYLENOL ) 500 MG tablet Take 1,000 mg by mouth every 8  (eight) hours as needed for moderate pain (pain score 4-6).     Calcium -Vitamin D -Vitamin K (VIACTIV CALCIUM  PLUS D) 650-12.5-40 MG-MCG-MCG CHEW Chew 1-2 each by mouth daily.     clonazePAM  (KLONOPIN ) 0.5 MG tablet 1/2 to 1 po BID prn anxiety 30 tablet 3   estradiol  (ESTRACE  VAGINAL) 0.1 MG/GM vaginal cream Place 1 Applicatorful vaginally 3 (three) times a week. 42.5 g 12   loratadine (CLARITIN) 10 MG tablet Take 1 tablet by mouth daily.     SUMAtriptan  (IMITREX ) 100 MG tablet TAKE 1 TABLET BY MOUTH AS NEEDED FOR MIGRAINE. MAY REPEAT ONCE AFTER 2 HOURS 10 tablet 5   tretinoin (RETIN-A) 0.025 % cream 1 application in the evening to face Externally Once a day; Duration: 14 days     triamcinolone  ointment (KENALOG ) 0.5 % Apply 1 Application topically 2 (two) times daily. 30 g 0   No current facility-administered medications for this visit.     ALLERGIES: Patient has no known allergies.  Family History  Problem Relation Age of Onset   Healthy Mother    Brain cancer Mother 30       glioblastoma   Hyperlipidemia Father    Healthy Brother    Lung cancer Paternal Uncle 41 - 51       smoked   Bone cancer Paternal Uncle 26 - 79       spine   Lung cancer Paternal Grandmother 43 - 84       smoked   Breast cancer Paternal Grandmother 37 - 84   Healthy Daughter    Healthy Son    Pancreatic cancer Other 50       maternal great aunt's daughter    Social History   Socioeconomic History   Marital status: Married    Spouse name: Not on file   Number of children: Not on file   Years of education: Not on file   Highest education level: Bachelor's degree (e.g., BA, AB, BS)  Occupational History   Occupation: payroll  Tobacco Use   Smoking status: Never   Smokeless tobacco: Never  Vaping Use   Vaping status: Never Used  Substance and Sexual Activity   Alcohol use: Not Currently   Drug use: Never   Sexual activity: Yes    Partners: Male    Birth control/protection: Other-see comments,  Surgical    Comment: Hyst, spouse-vasectomy  Other Topics Concern   Not on file  Social History Narrative   Not on file   Social Drivers of Health   Tobacco Use: Low Risk (11/03/2024)   Patient History    Smoking Tobacco Use: Never  Smokeless Tobacco Use: Never    Passive Exposure: Not on file  Financial Resource Strain: Low Risk (05/18/2024)   Overall Financial Resource Strain (CARDIA)    Difficulty of Paying Living Expenses: Not hard at all  Food Insecurity: No Food Insecurity (05/18/2024)   Epic    Worried About Programme Researcher, Broadcasting/film/video in the Last Year: Never true    Ran Out of Food in the Last Year: Never true  Transportation Needs: No Transportation Needs (05/18/2024)   Epic    Lack of Transportation (Medical): No    Lack of Transportation (Non-Medical): No  Physical Activity: Sufficiently Active (05/18/2024)   Exercise Vital Sign    Days of Exercise per Week: 4 days    Minutes of Exercise per Session: 40 min  Stress: No Stress Concern Present (05/18/2024)   [employer] of Occupational Health - Occupational Stress Questionnaire    Feeling of Stress: Only a little  Social Connections: Moderately Integrated (05/18/2024)   Social Connection and Isolation Panel    Frequency of Communication with Friends and Family: More than three times a week    Frequency of Social Gatherings with Friends and Family: Once a week    Attends Religious Services: 1 to 4 times per year    Active Member of [employer] or Organizations: No    Attends Banker Meetings: Not on file    Marital Status: Married  Catering Manager Violence: Not At Risk (08/15/2023)   Humiliation, Afraid, Rape, and Kick questionnaire    Fear of Current or Ex-Partner: No    Emotionally Abused: No    Physically Abused: No    Sexually Abused: No  Depression (PHQ2-9): Low Risk (05/18/2024)   Depression (PHQ2-9)    PHQ-2 Score: 0  Alcohol Screen: Low Risk (05/18/2024)   Alcohol Screen    Last Alcohol Screening  Score (AUDIT): 1  Housing: Unknown (05/18/2024)   Epic    Unable to Pay for Housing in the Last Year: No    Number of Times Moved in the Last Year: Not on file    Homeless in the Last Year: No  Utilities: Not At Risk (08/15/2023)   [facility] Utilities    Threatened with loss of utilities: No  Health Literacy: Adequate Health Literacy (08/15/2023)   B1300 Health Literacy    Frequency of need for help with medical instructions: Never    Review of Systems  All other systems reviewed and are negative.   PHYSICAL EXAMINATION:   BP 126/84 (BP Location: Left Arm, Patient Position: Sitting)   Pulse 86   LMP 05/23/2023 Comment: spotting  SpO2 99%     General appearance: alert, cooperative and appears stated age Abdomen: incisions well healed.  Abdomen is soft, non-tender, no masses,  no organomegaly No abnormal inguinal nodes palpated  Pelvic: External genitalia:  no lesions              Urethra:  normal appearing urethra with no masses, tenderness or lesions              Bartholins and Skenes: normal                 Vagina: normal appearing vagina with normal color and discharge, no lesions              Cervix: absent                Bimanual Exam:  Uterus: absent.  Adnexa: no mass, fullness, tenderness              Rectal exam: yes.  Confirms.              Anus:  normal sphincter tone, no lesions  Chaperone was present for exam:  Kari HERO, CMA  ASSESSMENT:  Stage IA2 grade 1 endometrioid endometrial adenocarcinoma. 08/21/23: Diagnostic laparoscopy, lysis of adhesions, robotic-assisted laparoscopic total hysterectomy with bilateral salpingoophorectomy, SLN biopsy bilaterally. No evidence of recurrence.  Genetic testing.  Has VUS.     PLAN:  We reviewed signs and symptoms of recurrence of endometrial cancer.  We discussed the benefits of local vaginal estrogen therapy.  She has refills.  If she would like to start ERT, I recommend consultation with GYN ONC.  We reviewed  non hormonal tx:  SSRI/SNRI, Veozah, gabapentin .  No Rx given.  Return for annual exam in April, 2026.  She will see GYN Oncology in in 6 months for her next cancer surveillance.   25 min  total time was spent for this patient encounter, including preparation, face-to-face counseling with the patient, coordination of care, and documentation of the encounter.    "

## 2024-11-07 ENCOUNTER — Telehealth: Admitting: Physician Assistant

## 2024-11-07 DIAGNOSIS — J0100 Acute maxillary sinusitis, unspecified: Secondary | ICD-10-CM

## 2024-11-07 MED ORDER — FLUTICASONE PROPIONATE 50 MCG/ACT NA SUSP: 2.0000 | Freq: Every day | NASAL | 6 refills | Status: AC

## 2024-11-07 MED ORDER — LEVOCETIRIZINE DIHYDROCHLORIDE 5 MG PO TABS: 5.0000 mg | ORAL_TABLET | Freq: Every evening | ORAL | 0 refills | Status: AC

## 2024-11-07 MED ORDER — AMOXICILLIN-POT CLAVULANATE 875-125 MG PO TABS: 1.0000 | ORAL_TABLET | Freq: Two times a day (BID) | ORAL | 0 refills | Status: AC

## 2024-11-07 NOTE — Progress Notes (Signed)
 E-Visit for Sinus Problems  We are sorry that you are not feeling well.  Here is how we plan to help!  Based on what you have shared with me it looks like you have sinusitis.  Sinusitis is inflammation and infection in the sinus cavities of the head.  Based on your presentation I believe you most likely have Acute Bacterial Sinusitis.  This is an infection caused by bacteria and is treated with antibiotics. I have prescribed Augmentin  875mg /125mg  one tablet twice daily with food, for 7 days., I have also prescribed Flonase  Nasal Spray Use 2 sprays in each nostril daily for 10-14 days, and Levocetirizine 10mg  Take 1 tablet at bedtime You may use an oral decongestant such as Mucinex D or if you have glaucoma or high blood pressure use plain Mucinex. Saline nasal spray help and can safely be used as often as needed for congestion.  If you develop worsening sinus pain, fever or notice severe headache and vision changes, or if symptoms are not better after completion of antibiotic, please schedule an appointment with a health care provider.    Sinus infections are not as easily transmitted as other respiratory infection, however we still recommend that you avoid close contact with loved ones, especially the very young and elderly.  Remember to wash your hands thoroughly throughout the day as this is the number one way to prevent the spread of infection!  Home Care: Only take medications as instructed by your medical team. Complete the entire course of an antibiotic. Do not take these medications with alcohol. A steam or ultrasonic humidifier can help congestion.  You can place a towel over your head and breathe in the steam from hot water coming from a faucet. Avoid close contacts especially the very young and the elderly. Cover your mouth when you cough or sneeze. Always remember to wash your hands.  Get Help Right Away If: You develop worsening fever or sinus pain. You develop a severe head ache  or visual changes. Your symptoms persist after you have completed your treatment plan.  Make sure you Understand these instructions. Will watch your condition. Will get help right away if you are not doing well or get worse.  Your e-visit answers were reviewed by a board certified advanced clinical practitioner to complete your personal care plan.  Depending on the condition, your plan could have included both over the counter or prescription medications.  If there is a problem please reply  once you have received a response from your provider.  Your safety is important to us .  If you have drug allergies check your prescription carefully.    You can use MyChart to ask questions about today's visit, request a non-urgent call back, or ask for a work or school excuse for 24 hours related to this e-Visit. If it has been greater than 24 hours you will need to follow up with your provider, or enter a new e-Visit to address those concerns.  You will get an e-mail in the next two days asking about your experience.  I hope that your e-visit has been valuable and will speed your recovery. Thank you for using e-visits.  I have spent 5 minutes in review of e-visit questionnaire, review and updating patient chart, medical decision making and response to patient.   Teena Shuck, PA-C

## 2025-02-10 ENCOUNTER — Ambulatory Visit: Admitting: Obstetrics and Gynecology

## 2025-05-04 ENCOUNTER — Ambulatory Visit: Admitting: Gynecologic Oncology
